# Patient Record
Sex: Male | Born: 1945
Health system: Southern US, Community
[De-identification: ages and names within clinical notes are randomized; demographics above are authoritative.]

## PROBLEM LIST (undated history)

## (undated) DIAGNOSIS — C679 Malignant neoplasm of bladder, unspecified: Secondary | ICD-10-CM

## (undated) DIAGNOSIS — R319 Hematuria, unspecified: Secondary | ICD-10-CM

## (undated) DIAGNOSIS — I10 Essential (primary) hypertension: Secondary | ICD-10-CM

## (undated) DIAGNOSIS — Z923 Personal history of irradiation: Secondary | ICD-10-CM

## (undated) DIAGNOSIS — C775 Secondary and unspecified malignant neoplasm of intrapelvic lymph nodes: Principal | ICD-10-CM

## (undated) DIAGNOSIS — IMO0001 Reserved for inherently not codable concepts without codable children: Secondary | ICD-10-CM

## (undated) DIAGNOSIS — M25461 Effusion, right knee: Secondary | ICD-10-CM

## (undated) DIAGNOSIS — R350 Frequency of micturition: Secondary | ICD-10-CM

## (undated) HISTORY — DX: Malignant neoplasm of bladder, unspecified: C67.9

## (undated) HISTORY — DX: Secondary and unspecified malignant neoplasm of intrapelvic lymph nodes: C77.5

## (undated) HISTORY — DX: Personal history of irradiation: Z92.3

---

## 2002-01-31 ENCOUNTER — Ambulatory Visit (HOSPITAL_COMMUNITY): Admission: RE | Admit: 2002-01-31 | Discharge: 2002-01-31 | Payer: Self-pay | Admitting: Gastroenterology

## 2003-01-17 ENCOUNTER — Emergency Department (HOSPITAL_COMMUNITY): Admission: EM | Admit: 2003-01-17 | Discharge: 2003-01-17 | Payer: Self-pay | Admitting: Emergency Medicine

## 2010-12-06 ENCOUNTER — Ambulatory Visit: Payer: Self-pay | Admitting: Internal Medicine

## 2012-01-08 ENCOUNTER — Ambulatory Visit: Payer: Self-pay | Admitting: Family Medicine

## 2012-01-08 ENCOUNTER — Other Ambulatory Visit: Payer: Self-pay | Admitting: Family Medicine

## 2012-01-08 VITALS — BP 124/75 | HR 67 | Temp 97.7°F | Resp 16 | Ht 73.5 in | Wt 191.0 lb

## 2012-01-08 DIAGNOSIS — N390 Urinary tract infection, site not specified: Secondary | ICD-10-CM

## 2012-01-08 DIAGNOSIS — R300 Dysuria: Secondary | ICD-10-CM

## 2012-01-08 NOTE — Progress Notes (Signed)
66 yo man with dysuria several days, similar to last summer.  No fever.  Unmarried, no discharge but concerned about STD.  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  He was scoped last summer by urology and cleared with nl prostate. O:  NAD, alert coorperative Genitalia:  Normal uncirc male Rectal:  Normal prostate.  A:  UTI likely without complications P:  chk urine cx, uriprobe cipro 500 bid x 7d.

## 2012-01-08 NOTE — Patient Instructions (Signed)

## 2012-01-09 LAB — GC/CHLAMYDIA PROBE AMP, URINE
Chlamydia, Swab/Urine, PCR: NEGATIVE
GC Probe Amp, Urine: NEGATIVE

## 2012-01-10 LAB — URINE CULTURE: Colony Count: 5000

## 2012-01-15 ENCOUNTER — Other Ambulatory Visit: Payer: Self-pay

## 2012-01-15 NOTE — Telephone Encounter (Signed)
Pt would like to see if Dr would call in another rx he still is having some burning while he urinates and he is not understanding why ,he has taken all of his medication -[facility] [address]

## 2012-01-16 NOTE — Telephone Encounter (Signed)
Please advise on this. Thanks 

## 2012-01-17 NOTE — Telephone Encounter (Signed)
Please advise the patient to return for re-evaluation if his symptoms persist.

## 2012-01-17 NOTE — Telephone Encounter (Signed)
LMOM to RTC. 

## 2012-01-19 ENCOUNTER — Ambulatory Visit (INDEPENDENT_AMBULATORY_CARE_PROVIDER_SITE_OTHER): Payer: Self-pay | Admitting: Family Medicine

## 2012-01-19 VITALS — BP 155/80 | HR 67 | Temp 97.7°F | Resp 16 | Ht 72.0 in | Wt 198.0 lb

## 2012-01-19 DIAGNOSIS — R300 Dysuria: Secondary | ICD-10-CM

## 2012-01-19 MED ORDER — DOXYCYCLINE HYCLATE 100 MG PO TABS: 100.0000 mg | ORAL_TABLET | Freq: Two times a day (BID) | ORAL | Status: AC

## 2012-01-19 NOTE — Progress Notes (Signed)
S:  Still having mild dysuria.   Results for orders placed in visit on 01/08/12  URINE CULTURE      Component Value Range   Colony Count 5,000 COLONIES/ML     Organism ID, Bacteria Insignificant Growth    GC/CHLAMYDIA PROBE AMP, URINE      Component Value Range   Chlamydia, Swab/Urine, PCR NEGATIVE  NEGATIVE    GC Probe Amp, Urine NEGATIVE  NEGATIVE    P:  Doxycycline 100 mg bid x 7 days

## 2012-02-07 ENCOUNTER — Telehealth: Payer: Self-pay

## 2012-02-07 NOTE — Telephone Encounter (Signed)
Dr. Milus Glazier  Pt states he was treated for UTI - the medication you gave him worked.  However, sometimes he has a burning sensation when urinating.  He would like additional medication to completely clear up the symptons.  [facility] [address]

## 2012-02-08 ENCOUNTER — Other Ambulatory Visit: Payer: Self-pay | Admitting: Family Medicine

## 2012-02-08 MED ORDER — DOXYCYCLINE HYCLATE 100 MG PO TABS: 100.0000 mg | ORAL_TABLET | Freq: Two times a day (BID) | ORAL | Status: AC

## 2012-02-08 NOTE — Telephone Encounter (Signed)
Doxycycline called in

## 2012-02-09 NOTE — Telephone Encounter (Signed)
Patient notified and voiced understating. 

## 2012-03-24 ENCOUNTER — Ambulatory Visit: Payer: Self-pay | Admitting: Emergency Medicine

## 2012-03-24 VITALS — BP 156/87 | HR 56 | Temp 97.5°F | Resp 16 | Ht 74.5 in | Wt 195.4 lb

## 2012-03-24 DIAGNOSIS — S335XXA Sprain of ligaments of lumbar spine, initial encounter: Secondary | ICD-10-CM

## 2012-03-24 MED ORDER — NAPROXEN SODIUM 550 MG PO TABS: 550.0000 mg | ORAL_TABLET | Freq: Two times a day (BID) | ORAL | Status: AC

## 2012-03-24 MED ORDER — TRAMADOL HCL 50 MG PO TABS: 50.0000 mg | ORAL_TABLET | Freq: Four times a day (QID) | ORAL | Status: AC | PRN

## 2012-03-24 MED ORDER — CYCLOBENZAPRINE HCL 10 MG PO TABS: 10.0000 mg | ORAL_TABLET | Freq: Three times a day (TID) | ORAL | Status: AC | PRN

## 2012-03-24 NOTE — Patient Instructions (Addendum)
Back Pain, Adult Low back pain is very common. About 1 in 5 people have back pain.The cause of low back pain is rarely dangerous. The pain often gets better over time.About half of people with a sudden onset of back pain feel better in just 2 weeks. About 8 in 10 people feel better by 6 weeks.  CAUSES Some common causes of back pain include:  Strain of the muscles or ligaments supporting the spine.   Wear and tear (degeneration) of the spinal discs.   Arthritis.   Direct injury to the back.  DIAGNOSIS Most of the time, the direct cause of low back pain is not known.However, back pain can be treated effectively even when the exact cause of the pain is unknown.Answering your caregiver's questions about your overall health and symptoms is one of the most accurate ways to make sure the cause of your pain is not dangerous. If your caregiver needs more information, he or she may order lab work or imaging tests (X-rays or MRIs).However, even if imaging tests show changes in your back, this usually does not require surgery. HOME CARE INSTRUCTIONS For many people, back pain returns.Since low back pain is rarely dangerous, it is often a condition that people can learn to manageon their own.   Remain active. It is stressful on the back to sit or stand in one place. Do not sit, drive, or stand in one place for more than 30 minutes at a time. Take short walks on level surfaces as soon as pain allows.Try to increase the length of time you walk each day.   Do not stay in bed.Resting more than 1 or 2 days can delay your recovery.   Do not avoid exercise or work.Your body is made to move.It is not dangerous to be active, even though your back may hurt.Your back will likely heal faster if you return to being active before your pain is gone.   Pay attention to your body when you bend and lift. Many people have less discomfortwhen lifting if they bend their knees, keep the load close to their  bodies,and avoid twisting. Often, the most comfortable positions are those that put less stress on your recovering back.   Find a comfortable position to sleep. Use a firm mattress and lie on your side with your knees slightly bent. If you lie on your back, put a pillow under your knees.   Only take over-the-counter or prescription medicines as directed by your caregiver. Over-the-counter medicines to reduce pain and inflammation are often the most helpful.Your caregiver may prescribe muscle relaxant drugs.These medicines help dull your pain so you can more quickly return to your normal activities and healthy exercise.   Put ice on the injured area.   Put ice in a plastic bag.   Place a towel between your skin and the bag.   Leave the ice on for 15 to 20 minutes, 3 to 4 times a day for the first 2 to 3 days. After that, ice and heat may be alternated to reduce pain and spasms.   Ask your caregiver about trying back exercises and gentle massage. This may be of some benefit.   Avoid feeling anxious or stressed.Stress increases muscle tension and can worsen back pain.It is important to recognize when you are anxious or stressed and learn ways to manage it.Exercise is a great option.  SEEK MEDICAL CARE IF:  You have pain that is not relieved with rest or medicine.   You have   pain that does not improve in 1 week.   You have new symptoms.   You are generally not feeling well.  SEEK IMMEDIATE MEDICAL CARE IF:   You have pain that radiates from your back into your legs.   You develop new bowel or bladder control problems.   You have unusual weakness or numbness in your arms or legs.   You develop nausea or vomiting.   You develop abdominal pain.   You feel faint.  Document Released: 09/15/2005 Document Revised: 09/04/2011 Document Reviewed: 02/03/2011 Exit[employer] Patient Information 2012 [employer]. 
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
## 2013-04-20 ENCOUNTER — Ambulatory Visit (INDEPENDENT_AMBULATORY_CARE_PROVIDER_SITE_OTHER): Payer: Medicare Other | Admitting: Family Medicine

## 2013-04-20 VITALS — BP 135/85 | HR 86 | Temp 98.0°F | Resp 16 | Ht 75.0 in | Wt 191.0 lb

## 2013-04-20 DIAGNOSIS — R319 Hematuria, unspecified: Secondary | ICD-10-CM

## 2013-04-20 DIAGNOSIS — I10 Essential (primary) hypertension: Secondary | ICD-10-CM

## 2013-04-20 DIAGNOSIS — Z0000 Encounter for general adult medical examination without abnormal findings: Secondary | ICD-10-CM

## 2013-04-20 DIAGNOSIS — Z87891 Personal history of nicotine dependence: Secondary | ICD-10-CM

## 2013-04-20 DIAGNOSIS — R350 Frequency of micturition: Secondary | ICD-10-CM

## 2013-04-20 LAB — POCT URINALYSIS DIPSTICK
Bilirubin, UA: NEGATIVE
Glucose, UA: NEGATIVE
Ketones, UA: NEGATIVE
Leukocytes, UA: NEGATIVE
Nitrite, UA: NEGATIVE
pH, UA: 5.5

## 2013-04-20 LAB — PSA: PSA: 1.3 ng/mL (ref <=4.00)

## 2013-04-20 LAB — POCT UA - MICROSCOPIC ONLY
Casts, Ur, LPF, POC: NEGATIVE
Mucus, UA: POSITIVE

## 2013-04-20 LAB — POCT CBC
Lymph, poc: 2.6 (ref 0.6–3.4)
MCH, POC: 29.2 pg (ref 27–31.2)
MCHC: 31.7 g/dL — AB (ref 31.8–35.4)
MID (cbc): 0.5 (ref 0–0.9)
MPV: 10.6 fL (ref 0–99.8)
POC Granulocyte: 4.1 (ref 2–6.9)
POC LYMPH PERCENT: 35.7 %L (ref 10–50)
POC MID %: 7.1 %M (ref 0–12)
Platelet Count, POC: 193 10*3/uL (ref 142–424)
RDW, POC: 13.8 %
WBC: 7.2 10*3/uL (ref 4.6–10.2)

## 2013-04-20 LAB — LIPID PANEL
Cholesterol: 184 mg/dL (ref 0–200)
LDL Cholesterol: 117 mg/dL — ABNORMAL HIGH (ref 0–99)
Total CHOL/HDL Ratio: 3.3 Ratio
Triglycerides: 53 mg/dL (ref <150)
VLDL: 11 mg/dL (ref 0–40)

## 2013-04-20 LAB — COMPREHENSIVE METABOLIC PANEL
ALT: 31 U/L (ref 0–53)
Alkaline Phosphatase: 56 U/L (ref 39–117)
Creat: 1.4 mg/dL — ABNORMAL HIGH (ref 0.50–1.35)
Sodium: 139 mEq/L (ref 135–145)
Total Bilirubin: 0.7 mg/dL (ref 0.3–1.2)
Total Protein: 7.9 g/dL (ref 6.0–8.3)

## 2013-04-20 MED ORDER — CEPHALEXIN 500 MG PO CAPS: 500.0000 mg | ORAL_CAPSULE | Freq: Two times a day (BID) | ORAL | Status: DC

## 2013-04-20 MED ORDER — HYDROCHLOROTHIAZIDE 12.5 MG PO CAPS: 12.5000 mg | ORAL_CAPSULE | Freq: Every day | ORAL | Status: DC

## 2013-04-20 NOTE — Patient Instructions (Addendum)
I recommend that you have a Tdap vaccine to prevent tetanus, diptheria and pertussis (whooping cough).  Also, it would be wise to have a pneumonia vaccine to help protect you from lung infection and a shingles shot to prevent shingles.    I will be in touch with your labs  Please see your sports med doctor as needed for your knee.   Congratulations on quitting smoking.  Because you did smoke for a long time a screening chest CT is recommended.

## 2013-04-20 NOTE — Progress Notes (Addendum)
[facility] [address] [place] [address] [phone]  Date:  04/20/2013   Name:  Dustin Jordan   DOB:  1946/02/27   MRN:  [id number]  PCP:  No primary provider on file.    Chief Complaint: Annual Exam   History of Present Illness:  Dustin Jordan is a 67 y.o. very pleasant male patient who presents with the following:  Here today for a PE.   He has a histoyr of HTN on HCTZ.  He has noted some pain in his right knee for a couple of years.  However, it is bothing him more as of late so he might like to see a sports medicine doctor.  He plans to contact a orthopedist recommended to him by family.   He is fasting today for labs History of hematuria- reports he has been evaluated by urology in the past and told that all was ok.  Per urology notes in 04/2011 he had a suspicious lesion in his bladder that would need biopsy unless his culture was positive.  I do not see culture results.  Dustin Jordan is really not sure but thinks that maybe he was supposed to have a biopsy.    He has had a colonoscopy- he thinks in the last 5 years.    He smoked about 1 ppd from the age of 57 or 33, quit in 2001 started back and quit again in 2012  Discussed vaccines which are indicated for him including Tdap, penumovax and zostavax.  "I don't do shots."  He declines immuniztions today but says he will think about it.    He also states he has noted frequent urination for the last week or so.  He does not have any pain.  He has not noted any hematuria.   He is most bothered by frequent urination at night.  Last night he got up 3x- this is a lot for him.   There are no active problems to display for this patient.   History reviewed. No pertinent past medical history.  History reviewed. No pertinent past surgical history.  History  Substance Use Topics  . Smoking status: Former Games developer  . Smokeless tobacco: Not on file  . Alcohol Use: Not on file    History reviewed. No  pertinent family history.  No Known Allergies  Medication list has been reviewed and updated.  No current outpatient prescriptions on file prior to visit.   No current facility-administered medications on file prior to visit.    Review of Systems:  As per HPI- otherwise negative.  Physical Examination: Filed Vitals:   04/20/13 0833  BP: 142/90  Pulse: 86  Temp: 98 F (36.7 C)  Resp: 16   Filed Vitals:   04/20/13 0833  Height: 6\' 3"  (1.905 m)  Weight: 191 lb (86.637 kg)   Body mass index is 23.87 kg/(m^2). Ideal Body Weight: Weight in (lb) to have BMI = 25: 199.6  GEN: WDWN, NAD, Non-toxic, A & O x 3 HEENT: Atraumatic, Normocephalic. Neck supple. No masses, No LAD.  Bilateral TM wnl, oropharynx normal.  PEERL,EOMI.   Ears and Nose: No external deformity. CV: RRR, No M/G/R. No JVD. No thrill. No extra heart sounds. PULM: CTA B, no wheezes, crackles, rhonchi. No retractions. No resp. distress. No accessory muscle use. ABD: S, NT, ND, +BS. No rebound. No HSM. EXTR: No c/c/e NEURO Normal gait.  PSYCH: Normally interactive. Conversant. Not depressed or anxious appearing.  Calm demeanor.  GU: no scrotal masses or tenderness.  Prostate normal Right knee: crepitus consistent with OA.  Normal ROM, no effusion. No acute pain  Results for orders placed in visit on 04/20/13  POCT CBC      Result Value Range   WBC 7.2  4.6 - 10.2 K/uL   Lymph, poc 2.6  0.6 - 3.4   POC LYMPH PERCENT 35.7  10 - 50 %L   MID (cbc) 0.5  0 - 0.9   POC MID % 7.1  0 - 12 %M   POC Granulocyte 4.1  2 - 6.9   Granulocyte percent 51.2  37 - 80 %G   RBC 4.72  4.69 - 6.13 M/uL   Hemoglobin 13.8 (*) 14.1 - 18.1 g/dL   HCT, POC 16.1  09.6 - 53.7 %   MCV 92.2  80 - 97 fL   MCH, POC 29.2  27 - 31.2 pg   MCHC 31.7 (*) 31.8 - 35.4 g/dL   RDW, POC 04.5     Platelet Count, POC 193  142 - 424 K/uL   MPV 10.6  0 - 99.8 fL  POCT URINALYSIS DIPSTICK      Result Value Range   Color, UA brown     Clarity, UA  cloudy     Glucose, UA neg     Bilirubin, UA neg     Ketones, UA neg     Spec Grav, UA 1.025     Blood, UA large     pH, UA 5.5     Protein, UA 100     Urobilinogen, UA 0.2     Nitrite, UA neg     Leukocytes, UA Negative    POCT UA - MICROSCOPIC ONLY      Result Value Range   WBC, Ur, HPF, POC 2-5     RBC, urine, microscopic TNTC     Bacteria, U Microscopic 1+     Mucus, UA pos     Epithelial cells, urine per micros 2-3     Crystals, Ur, HPF, POC neg     Casts, Ur, LPF, POC neg     Yeast, UA neg     Amorphous, UA pos      Assessment and Plan: Physical exam - Plan: POCT CBC, Comprehensive metabolic panel, PSA, POCT urinalysis dipstick, POCT UA - Microscopic Only, Lipid panel  HTN (hypertension) - Plan: hydrochlorothiazide (MICROZIDE) 12.5 MG capsule  History of smoking - Plan: CT CHEST WO/CM SCREENING  Hematuria - Plan: Urine culture, cephALEXin (KEFLEX) 500 MG capsule  Urinary frequency - Plan: Urine culture  Dustin Jordan is here for a PE exam today.  His BP is ok- continue HCTZ and await his labs. History of smoking that fits the [place]PSTF requirements for screening chest CT.  He is interested in this test, will arrange. I am concerned that he may not have returned for follow-up as needed for hematuria.  Will start keflex for possible UTI and await culture.  Plan to contact urology regarding his follow- up and send back as needed.  His smoking history places him at a higher risk for bladder cancer. Advised regarding recommended immunizations. He will think about these but declines today.      Signed Abbe Amsterdam, MD  [facility], Dr. Sherron Monday  7/24- called to give him an update. Called [facility] and it looks like his ucx was positive in 2012.  He says he feels better with keflex.  Await urine culture to decide about hematuria work-up  7/27- received negative urine culture.  Referral to urology placed, records faxed, pt alerted that he needs urology evaluation. Called him and  let him know- he is to call us if he has not heard about his urology appt in the next week or so.  Advised that he may have bladder cancer and that urology evaluation is essential.   Advised he can stop taking the keflex after one week- he was given 40 pills in error.

## 2013-04-21 ENCOUNTER — Telehealth: Payer: Self-pay | Admitting: Family Medicine

## 2013-04-21 NOTE — Telephone Encounter (Signed)
Called to speak to Dr. Mina Marble nurse.  Diaz did have a positive urine culture in 2012, so the bladder bx was not needed.  Await his repeat urine culture from yesterday to see if he needs to see urology again

## 2013-04-24 ENCOUNTER — Encounter: Payer: Self-pay | Admitting: Family Medicine

## 2013-04-24 NOTE — Addendum Note (Signed)
Addended by: Abbe Amsterdam C on: 04/24/2013 06:40 AM   Modules accepted: Orders

## 2013-04-24 NOTE — Addendum Note (Signed)
Addended by: Pearline Cables on: 04/24/2013 06:35 PM   Modules accepted: Orders

## 2013-05-03 ENCOUNTER — Telehealth: Payer: Self-pay | Admitting: Radiology

## 2013-05-03 NOTE — Telephone Encounter (Signed)
Called [facility] regarding CT chest. Advised [place] preventative task force recommends Screening CT scan of the chest for patient since he is a smoker. Spoke to [employer] case reviewer. She will send case for further review with [employer] the case number [id number]. Unfortunately Marchelle Folks could not check this in her options to give approval. She is sending to the physician to review. Dustin Jordan

## 2013-05-04 ENCOUNTER — Other Ambulatory Visit: Payer: Self-pay

## 2013-05-24 ENCOUNTER — Other Ambulatory Visit: Payer: Self-pay | Admitting: Urology

## 2013-06-08 HISTORY — PX: KNEE ARTHROSCOPY: SUR90

## 2013-06-15 ENCOUNTER — Encounter (HOSPITAL_BASED_OUTPATIENT_CLINIC_OR_DEPARTMENT_OTHER): Payer: Self-pay | Admitting: *Deleted

## 2013-06-15 NOTE — Progress Notes (Signed)
06/15/13 1023  OBSTRUCTIVE SLEEP APNEA  Have you ever been diagnosed with sleep apnea through a sleep study? No  Do you snore loudly (loud enough to be heard through closed doors)?  1  Do you often feel tired, fatigued, or sleepy during the daytime? 0  Has anyone observed you stop breathing during your sleep? 0  Do you have, or are you being treated for high blood pressure? 1  BMI more than 35 kg/m2? 0  Age over 67 years old? 1  Neck circumference greater than 40 cm/18 inches? 0  Gender: 1  Obstructive Sleep Apnea Score 4  Score 4 or greater  Results sent to PCP

## 2013-06-15 NOTE — Progress Notes (Addendum)
NPO AFTER MN WITH EXCEPTION CLEAR LIQUIDS UNTIL 0900 (NO CREAM/ MILK PRODUCTS).  ARRIVE AT 1330. NEEDS ISTAT 8 AND EKG.  

## 2013-06-15 NOTE — Progress Notes (Signed)
06/15/13 1050  OBSTRUCTIVE SLEEP APNEA  Have you ever been diagnosed with sleep apnea through a sleep study? No  If yes, do you have and use a CPAP or BPAP machine every night? 0  Do you snore loudly (loud enough to be heard through closed doors)?  1  Do you often feel tired, fatigued, or sleepy during the daytime? 0  Has anyone observed you stop breathing during your sleep? 0  Do you have, or are you being treated for high blood pressure? 1  BMI more than 35 kg/m2? 0  Age over 4 years old? 1  Neck circumference greater than 40 cm/18 inches? 0  Gender: 1  Obstructive Sleep Apnea Score 4  Score 4 or greater  Results sent to PCP

## 2013-06-20 ENCOUNTER — Telehealth: Payer: Self-pay | Admitting: Family Medicine

## 2013-06-20 NOTE — Telephone Encounter (Signed)
Called to do peer- to peer for his screening CT chest.  Medicare does not cover this service.  He can either pay for it or we can do a CXR and see if we find any abnormality that might necessitate CT follow-up. Will share this information with him by letter

## 2013-06-22 ENCOUNTER — Inpatient Hospital Stay (HOSPITAL_COMMUNITY): Payer: Medicare Other

## 2013-06-22 ENCOUNTER — Encounter (HOSPITAL_BASED_OUTPATIENT_CLINIC_OR_DEPARTMENT_OTHER): Payer: Self-pay | Admitting: Anesthesiology

## 2013-06-22 ENCOUNTER — Ambulatory Visit (HOSPITAL_COMMUNITY): Payer: Medicare Other

## 2013-06-22 ENCOUNTER — Ambulatory Visit (HOSPITAL_BASED_OUTPATIENT_CLINIC_OR_DEPARTMENT_OTHER): Payer: Medicare Other | Admitting: Anesthesiology

## 2013-06-22 ENCOUNTER — Other Ambulatory Visit: Payer: Self-pay

## 2013-06-22 ENCOUNTER — Observation Stay (HOSPITAL_BASED_OUTPATIENT_CLINIC_OR_DEPARTMENT_OTHER)
Admission: RE | Admit: 2013-06-22 | Discharge: 2013-06-23 | Disposition: A | Discharge status: Home / Self Care | Payer: Medicare Other | Source: Ambulatory Visit | Attending: Urology | Admitting: Urology

## 2013-06-22 ENCOUNTER — Encounter (HOSPITAL_COMMUNITY)
Admission: RE | Disposition: A | Discharge status: Home / Self Care | Payer: Medicare Other | Source: Ambulatory Visit | Attending: Urology

## 2013-06-22 ENCOUNTER — Encounter (HOSPITAL_BASED_OUTPATIENT_CLINIC_OR_DEPARTMENT_OTHER): Payer: Self-pay | Admitting: *Deleted

## 2013-06-22 DIAGNOSIS — C671 Malignant neoplasm of dome of bladder: Principal | ICD-10-CM | POA: Insufficient documentation

## 2013-06-22 DIAGNOSIS — I10 Essential (primary) hypertension: Secondary | ICD-10-CM | POA: Insufficient documentation

## 2013-06-22 DIAGNOSIS — Z87891 Personal history of nicotine dependence: Secondary | ICD-10-CM | POA: Insufficient documentation

## 2013-06-22 HISTORY — DX: Reserved for inherently not codable concepts without codable children: IMO0001

## 2013-06-22 HISTORY — DX: Malignant neoplasm of bladder, unspecified: C67.9

## 2013-06-22 HISTORY — DX: Effusion, right knee: M25.461

## 2013-06-22 HISTORY — DX: Hematuria, unspecified: R31.9

## 2013-06-22 HISTORY — PX: CYSTOSCOPY W/ RETROGRADES: SHX1426

## 2013-06-22 HISTORY — DX: Essential (primary) hypertension: I10

## 2013-06-22 HISTORY — PX: TRANSURETHRAL RESECTION OF BLADDER TUMOR WITH GYRUS (TURBT-GYRUS): SHX6458

## 2013-06-22 HISTORY — DX: Frequency of micturition: R35.0

## 2013-06-22 LAB — BASIC METABOLIC PANEL
CO2: 23 mEq/L (ref 19–32)
Calcium: 9.5 mg/dL (ref 8.4–10.5)
Chloride: 103 mEq/L (ref 96–112)
Creatinine, Ser: 1.08 mg/dL (ref 0.50–1.35)
GFR calc Af Amer: 81 mL/min — ABNORMAL LOW (ref >90)
Glucose, Bld: 91 mg/dL (ref 70–99)

## 2013-06-22 LAB — POCT I-STAT, CHEM 8
Creatinine, Ser: 1.3 mg/dL (ref 0.50–1.35)
Glucose, Bld: 82 mg/dL (ref 70–99)
HCT: 45 % (ref 39.0–52.0)
Hemoglobin: 15.3 g/dL (ref 13.0–17.0)
TCO2: 28 mmol/L (ref 0–100)

## 2013-06-22 SURGERY — CYSTOSCOPY, WITH RETROGRADE PYELOGRAM
Anesthesia: General | Site: Ureter | Wound class: Clean Contaminated

## 2013-06-22 MED ORDER — ACETAMINOPHEN 500 MG PO TABS
1000.0000 mg | ORAL_TABLET | Freq: Four times a day (QID) | ORAL | Status: DC
  Administered 2013-06-23 (×3): 1000 mg via ORAL
  Filled 2013-06-22 (×4): qty 2

## 2013-06-22 MED ORDER — GLYCOPYRROLATE 0.2 MG/ML IJ SOLN
INTRAMUSCULAR | Status: DC | PRN
  Administered 2013-06-22: .9 mg via INTRAVENOUS

## 2013-06-22 MED ORDER — SODIUM CHLORIDE 0.9 % IR SOLN
Status: DC | PRN
  Administered 2013-06-22: 1800 mL

## 2013-06-22 MED ORDER — LIDOCAINE HCL (CARDIAC) 20 MG/ML IV SOLN
INTRAVENOUS | Status: DC | PRN
  Administered 2013-06-22: 100 mg via INTRAVENOUS

## 2013-06-22 MED ORDER — FENTANYL CITRATE 0.05 MG/ML IJ SOLN
INTRAMUSCULAR | Status: DC | PRN
  Administered 2013-06-22 (×2): 100 ug via INTRAVENOUS

## 2013-06-22 MED ORDER — ROCURONIUM BROMIDE 100 MG/10ML IV SOLN
INTRAVENOUS | Status: DC | PRN
  Administered 2013-06-22: 25 mg via INTRAVENOUS
  Administered 2013-06-22: 10 mg via INTRAVENOUS

## 2013-06-22 MED ORDER — CIPROFLOXACIN HCL 500 MG PO TABS
500.0000 mg | ORAL_TABLET | Freq: Two times a day (BID) | ORAL | Status: DC
  Filled 2013-06-22: qty 1

## 2013-06-22 MED ORDER — OXYCODONE HCL 5 MG PO TABS
5.0000 mg | ORAL_TABLET | ORAL | Status: DC | PRN
  Administered 2013-06-23: 5 mg via ORAL
  Filled 2013-06-22 (×2): qty 1

## 2013-06-22 MED ORDER — DEXAMETHASONE SODIUM PHOSPHATE 4 MG/ML IJ SOLN
INTRAMUSCULAR | Status: DC | PRN
  Administered 2013-06-22: 10 mg via INTRAVENOUS

## 2013-06-22 MED ORDER — CIPROFLOXACIN HCL 500 MG PO TABS
500.0000 mg | ORAL_TABLET | Freq: Two times a day (BID) | ORAL | Status: AC
  Administered 2013-06-22: 21:00:00 500 mg via ORAL
  Filled 2013-06-22: qty 1

## 2013-06-22 MED ORDER — GENTAMICIN SULFATE 40 MG/ML IJ SOLN
5.0000 mg/kg | INTRAVENOUS | Status: AC
  Administered 2013-06-22: 430 mg via INTRAVENOUS
  Filled 2013-06-22: qty 10.75

## 2013-06-22 MED ORDER — KCL IN DEXTROSE-NACL 20-5-0.45 MEQ/L-%-% IV SOLN
INTRAVENOUS | Status: DC
  Administered 2013-06-22: 22:00:00 via INTRAVENOUS
  Filled 2013-06-22 (×2): qty 1000

## 2013-06-22 MED ORDER — DOCUSATE SODIUM 100 MG PO CAPS
100.0000 mg | ORAL_CAPSULE | Freq: Two times a day (BID) | ORAL | Status: DC
  Administered 2013-06-22 – 2013-06-23 (×2): 100 mg via ORAL
  Filled 2013-06-22 (×4): qty 1

## 2013-06-22 MED ORDER — SENNA 8.6 MG PO TABS
1.0000 | ORAL_TABLET | Freq: Two times a day (BID) | ORAL | Status: DC
  Administered 2013-06-22 – 2013-06-23 (×2): 8.6 mg via ORAL
  Filled 2013-06-22 (×3): qty 1

## 2013-06-22 MED ORDER — IOHEXOL 350 MG/ML SOLN
INTRAVENOUS | Status: DC | PRN
  Administered 2013-06-22: 17 mL

## 2013-06-22 MED ORDER — LACTATED RINGERS IV SOLN
INTRAVENOUS | Status: DC
  Administered 2013-06-22 (×2): via INTRAVENOUS
  Filled 2013-06-22: qty 1000

## 2013-06-22 MED ORDER — HYDROCHLOROTHIAZIDE 12.5 MG PO CAPS
12.5000 mg | ORAL_CAPSULE | Freq: Every day | ORAL | Status: DC
  Administered 2013-06-23: 12.5 mg via ORAL
  Filled 2013-06-22 (×2): qty 1

## 2013-06-22 MED ORDER — OXYBUTYNIN CHLORIDE 5 MG PO TABS
5.0000 mg | ORAL_TABLET | Freq: Three times a day (TID) | ORAL | Status: DC | PRN
  Filled 2013-06-22 (×2): qty 1

## 2013-06-22 MED ORDER — IOHEXOL 300 MG/ML  SOLN
100.0000 mL | Freq: Once | INTRAMUSCULAR | Status: AC | PRN
  Administered 2013-06-22: 100 mL via INTRAVENOUS

## 2013-06-22 MED ORDER — NEOSTIGMINE METHYLSULFATE 1 MG/ML IJ SOLN
INTRAMUSCULAR | Status: DC | PRN
  Administered 2013-06-22: 5 mg via INTRAVENOUS

## 2013-06-22 MED ORDER — HYDROMORPHONE HCL PF 1 MG/ML IJ SOLN
0.5000 mg | INTRAMUSCULAR | Status: DC | PRN
  Administered 2013-06-22 (×2): 1 mg via INTRAVENOUS
  Administered 2013-06-22 (×2): 0.25 mg via INTRAVENOUS
  Administered 2013-06-23: 1 mg via INTRAVENOUS
  Filled 2013-06-22 (×4): qty 1

## 2013-06-22 MED ORDER — PROPOFOL 10 MG/ML IV BOLUS
INTRAVENOUS | Status: DC | PRN
  Administered 2013-06-22: 200 mg via INTRAVENOUS

## 2013-06-22 MED ORDER — ONDANSETRON HCL 4 MG/2ML IJ SOLN
4.0000 mg | INTRAMUSCULAR | Status: DC | PRN
  Filled 2013-06-22: qty 2

## 2013-06-22 SURGICAL SUPPLY — 35 items
BAG DRN ANRFLXCHMBR STRAP LEK (BAG)
BAG URINE DRAINAGE (UROLOGICAL SUPPLIES) ×1 IMPLANT
BAG URINE LEG 19OZ MD ST LTX (BAG) IMPLANT
BAG URINE LEG 500ML (DRAIN) IMPLANT
BAG URO CATCHER STRL LF (DRAPE) ×3 IMPLANT
BASKET ZERO TIP NITINOL 2.4FR (BASKET) IMPLANT
BSKT STON RTRVL ZERO TP 2.4FR (BASKET)
CANISTER SUCT LVC 12 LTR MEDI- (MISCELLANEOUS) ×2 IMPLANT
CATH FOLEY 2WAY SLVR  5CC 22FR (CATHETERS)
CATH FOLEY 2WAY SLVR 30CC 20FR (CATHETERS) IMPLANT
CATH FOLEY 2WAY SLVR 5CC 22FR (CATHETERS) IMPLANT
CATH FOLEY 3WAY 30CC 22F (CATHETERS) ×1 IMPLANT
CATH INTERMIT  6FR 70CM (CATHETERS) IMPLANT
CLOTH BEACON ORANGE TIMEOUT ST (SAFETY) ×3 IMPLANT
DRAPE CAMERA CLOSED 9X96 (DRAPES) ×3 IMPLANT
ELECT LOOP MED HF 24F 12D CBL (CLIP) IMPLANT
ELECT REM PT RETURN 9FT ADLT (ELECTROSURGICAL) ×3
ELECT RESECT VAPORIZE 12D CBL (ELECTRODE) IMPLANT
ELECTRODE REM PT RTRN 9FT ADLT (ELECTROSURGICAL) ×2 IMPLANT
EVACUATOR MICROVAS BLADDER (UROLOGICAL SUPPLIES) IMPLANT
GLOVE BIO SURGEON STRL SZ 6 (GLOVE) ×2 IMPLANT
GLOVE BIO SURGEON STRL SZ 6.5 (GLOVE) ×1 IMPLANT
GLOVE BIO SURGEON STRL SZ7.5 (GLOVE) ×3 IMPLANT
GLOVE BIOGEL PI IND STRL 6.5 (GLOVE) IMPLANT
GLOVE BIOGEL PI INDICATOR 6.5 (GLOVE) ×1
GOWN PREVENTION PLUS XLARGE (GOWN DISPOSABLE) ×3 IMPLANT
GOWN STRL NON-REIN LRG LVL3 (GOWN DISPOSABLE) ×6 IMPLANT
GUIDEWIRE ANG ZIPWIRE 038X150 (WIRE) IMPLANT
GUIDEWIRE STR DUAL SENSOR (WIRE) ×3 IMPLANT
IV NS IRRIG 3000ML ARTHROMATIC (IV SOLUTION) ×8 IMPLANT
KIT ASPIRATION TUBING (SET/KITS/TRAYS/PACK) IMPLANT
PACK CYSTOSCOPY (CUSTOM PROCEDURE TRAY) ×3 IMPLANT
SET ASPIRATION TUBING (TUBING) IMPLANT
SYR 30ML LL (SYRINGE) ×1 IMPLANT
SYRINGE IRR TOOMEY STRL 70CC (SYRINGE) IMPLANT

## 2013-06-22 NOTE — Anesthesia Preprocedure Evaluation (Addendum)
Anesthesia Evaluation  Patient identified by MRN, date of birth, ID band Patient awake    Reviewed: Allergy & Precautions, H&P , NPO status , Patient's Chart, lab work & pertinent test results  Airway Mallampati: I TM Distance: >3 FB Neck ROM: Full    Dental  (+) Dental Advisory Given and Edentulous Upper   Pulmonary neg pulmonary ROS,  breath sounds clear to auscultation        Cardiovascular hypertension, Pt. on medications Rhythm:Regular Rate:Normal     Neuro/Psych negative neurological ROS  negative psych ROS   GI/Hepatic negative GI ROS, Neg liver ROS,   Endo/Other  negative endocrine ROS  Renal/GU negative Renal ROS     Musculoskeletal negative musculoskeletal ROS (+)   Abdominal   Peds  Hematology negative hematology ROS (+)   Anesthesia Other Findings   Reproductive/Obstetrics                          Anesthesia Physical Anesthesia Plan  ASA: II  Anesthesia Plan: General   Post-op Pain Management:    Induction: Intravenous  Airway Management Planned: LMA  Additional Equipment:   Intra-op Plan:   Post-operative Plan: Extubation in OR  Informed Consent: I have reviewed the patients History and Physical, chart, labs and discussed the procedure including the risks, benefits and alternatives for the proposed anesthesia with the patient or authorized representative who has indicated his/her understanding and acceptance.   Dental advisory given  Plan Discussed with: CRNA  Anesthesia Plan Comments:         Anesthesia Quick Evaluation

## 2013-06-22 NOTE — H&P (Signed)
Dustin Jordan is an 67 y.o. male.    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 Sherron Monday on work-up of gross hematuria. CT Urogram w/o distant or locally advanced disease. Prior smoker, now quit.  2- Prostate Screening -  04/2013 - PSA 1.0 / DRE wnl per report  PMH sig for HTN. No CV disease. No prior surgeries. No blood thinners.  Today Dustin Jordan is seen to proceed with transurethral resection of his bladder tumor for diagnostic / staging / and theraputic intent. NO interval fevers. Most recent UCX negative.   Past Medical History  Diagnosis Date  . Hypertension   . White coat hypertension   . Swelling of right knee joint     s/p arthroscopy 06-08-2013  . Bladder cancer   . Hematuria   . Frequency of urination     Past Surgical History  Procedure Laterality Date  . Knee arthroscopy Right 06-08-2013    No family history on file. Social History:  reports that he quit smoking about 3 years ago. His smoking use included Cigarettes. He has a 20 pack-year smoking history. He has never used smokeless tobacco. He reports that he does not drink alcohol or use illicit drugs.  Allergies: No Known Allergies  No prescriptions prior to admission    No results found for this or any previous visit (from the past 48 hour(s)). No results found.  Review of Systems  Constitutional: Negative.  Negative for fever and chills.  HENT: Negative.   Eyes: Negative.   Respiratory: Negative.   Cardiovascular: Negative.   Gastrointestinal: Negative.  Negative for nausea and vomiting.  Genitourinary: Negative.   Musculoskeletal: Negative.   Skin: Negative.   Neurological: Negative.   Endo/Heme/Allergies: Negative.   Psychiatric/Behavioral: Negative.     Height 6\' 3"  (1.905 m), weight 86.183 kg (190 lb). Physical Exam  Constitutional: He appears well-developed and  well-nourished.  HENT:  Head: Normocephalic and atraumatic.  Eyes: Pupils are equal, round, and reactive to light.  Neck: Normal range of motion. Neck supple.  Cardiovascular: Normal rate.   Respiratory: Effort normal and breath sounds normal.  GI: Soft. Bowel sounds are normal.  Genitourinary: Penis normal.  Musculoskeletal: Normal range of motion.  Neurological: He is alert.  Skin: Skin is warm and dry.  Psychiatric: He has a normal mood and affect. His behavior is normal. Judgment and thought content normal.     Assessment/Plan  1 - Bladder Cancer -  We rediscussed operative biopsy / transurethral resection as the best next step for diagnostic and therapeutic purposes with goals being to remove all visible cancer and obtain tissue for pathologic exam. We rediscussed that for some low-grade tumors, this may be all the treatment required, but that for many other tumors such as high-grade lesions, further therapy including surgery and or chemotherapy may be warranted. We also reoutlined the fact that any bladder cancer diagnosis will require close follow-up with periodic upper and lower tract evaluation. We rediscussed risks including bleeding, infection, damage to kidney / ureter / bladder including bladder perforation which can typically managed with prolonged foley catheterization. We rementioned anesthetic and other rare risks including DVT, PE, MI, and mortality. I also reentioned that adjunctive procedures such as ureteral stenting, retrograde pyelography, and ureteroscopy may be necessary to fully evaluate the urinary tract depending on intra-operative findings. After answering all questions to the patient's satisfaction,  they wish to proceed today as planned.Marland Kitchen   2- Prostate Screening - up to date this year.   Dustin Jordan 06/22/2013, 7:40 AM

## 2013-06-22 NOTE — Anesthesia Procedure Notes (Signed)
Procedure Name: Intubation Date/Time: 06/22/2013 4:34 PM Performed by: Maris Berger T Pre-anesthesia Checklist: Patient identified, Emergency Drugs available, Suction available and Patient being monitored Patient Re-evaluated:Patient Re-evaluated prior to inductionOxygen Delivery Method: Circle System Utilized Preoxygenation: Pre-oxygenation with 100% oxygen Intubation Type: IV induction Ventilation: Mask ventilation without difficulty Tube type: Oral Number of attempts: 1 Airway Equipment and Method: stylet and oral airway Placement Confirmation: ETT inserted through vocal cords under direct vision,  positive ETCO2 and breath sounds checked- equal and bilateral Secured at: 22 cm Tube secured with: Tape Dental Injury: Teeth and Oropharynx as per pre-operative assessment

## 2013-06-22 NOTE — Brief Op Note (Signed)
06/22/2013  5:44 PM  PATIENT:  Altamese Dilling  67 y.o. male  PRE-OPERATIVE DIAGNOSIS:  BLADDER CANCER  POST-OPERATIVE DIAGNOSIS:  BLADDER CANCER  PROCEDURE:  Procedure(s): CYSTOSCOPY WITH RETROGRADE PYELOGRAM (Bilateral) TRANSURETHRAL RESECTION OF BLADDER TUMOR WITH GYRUS (TURBT-GYRUS) (N/A)  SURGEON:  Surgeon(s) and Role:    * Sebastian Ache, MD - Primary  PHYSICIAN ASSISTANT:   ASSISTANTS: none   ANESTHESIA:   general  EBL:  Total I/O In: 1000 [I.V.:1000] Out: -   BLOOD ADMINISTERED:none  DRAINS: 53F 3 way foley catheter to NS irrigation 1 gtt per second, efflux light pink   LOCAL MEDICATIONS USED:  NONE  SPECIMEN:  Source of Specimen:  Massive BLadder Dome Tumor Fragments  DISPOSITION OF SPECIMEN:  PATHOLOGY  COUNTS:  YES  TOURNIQUET:  * No tourniquets in log *  DICTATION: .Other Dictation: Dictation Number [id number]*  PLAN OF CARE: Admit for overnight observation  PATIENT DISPOSITION:  PACU - hemodynamically stable.   Delay start of Pharmacological VTE agent (>24hrs) due to surgical blood loss or risk of bleeding: yes

## 2013-06-22 NOTE — Transfer of Care (Signed)
Immediate Anesthesia Transfer of Care Note  Patient: Dustin Jordan  Procedure(s) Performed: Procedure(s): CYSTOSCOPY WITH RETROGRADE PYELOGRAM (Bilateral) TRANSURETHRAL RESECTION OF BLADDER TUMOR WITH GYRUS (TURBT-GYRUS) (N/A)  Patient Location: PACU  Anesthesia Type:General  Level of Consciousness: awake, alert  and oriented  Airway & Oxygen Therapy: Patient Spontanous Breathing and Patient connected to nasal cannula oxygen  Post-op Assessment: Report given to PACU RN  Post vital signs: Reviewed and stable  Complications: No apparent anesthesia complications

## 2013-06-23 ENCOUNTER — Encounter (HOSPITAL_BASED_OUTPATIENT_CLINIC_OR_DEPARTMENT_OTHER): Payer: Self-pay | Admitting: Urology

## 2013-06-23 MED ORDER — TRAMADOL HCL 50 MG PO TABS: 50.0000 mg | ORAL_TABLET | Freq: Four times a day (QID) | ORAL | Status: DC | PRN

## 2013-06-23 MED ORDER — BACITRACIN-NEOMYCIN-POLYMYXIN 400-5-5000 EX OINT: 1.0000 "application " | TOPICAL_OINTMENT | Freq: Three times a day (TID) | CUTANEOUS | Status: DC | PRN

## 2013-06-23 MED ORDER — MENTHOL 3 MG MT LOZG
1.0000 | LOZENGE | OROMUCOSAL | Status: DC | PRN
  Administered 2013-06-23: 06:00:00 3 mg via ORAL
  Filled 2013-06-23: qty 9

## 2013-06-23 MED ORDER — SULFAMETHOXAZOLE-TMP DS 800-160 MG PO TABS: 1.0000 | ORAL_TABLET | Freq: Every day | ORAL | Status: DC

## 2013-06-23 MED ORDER — SENNOSIDES-DOCUSATE SODIUM 8.6-50 MG PO TABS: 1.0000 | ORAL_TABLET | Freq: Two times a day (BID) | ORAL | Status: DC

## 2013-06-23 NOTE — Progress Notes (Signed)
Pt education performed on foley catheter care with leg bag and overnight bag. Used teach back and patient accurately performed when asked.

## 2013-06-23 NOTE — Discharge Summary (Signed)
Physician Discharge Summary  Patient ID: Dustin Jordan MRN: [id number] DOB/AGE: [id number] y.o.  Admit date: 06/22/2013 Discharge date: 06/23/2013  Admission Diagnoses: Large Bladder Neoplasm  Discharge Diagnoses: Large Bladder Neoplasm   Discharged Condition: good  Hospital Course:   Pt underwent transurethral resection of large dome neoplasm on the day of admission 06/22/2013 without acute complications. He was admitted overnight for observation. ON day of surgery he underwent staging CT scan which revealed no distant disease but did reveal some gas in perivesical space c/w small extraperitoneal bladder microperforation. No contrast or urinary extravasation seen. Elected to manage conservatively with foley drainage which is in place at discharge with plan for office cystogram next week.   Consults: None  Significant Diagnostic Studies: radiology: CT scan: as per above  Treatments: surgery: as per above  Discharge Exam: Blood pressure 110/72, pulse 80, temperature 98 F (36.7 C), temperature source Oral, resp. rate 15, height 6\' 1"  (1.854 m), weight 85.503 kg (188 lb 8 oz), SpO2 99.00%. General appearance: alert, cooperative and appears stated age Head: Normocephalic, without obvious abnormality, atraumatic Eyes: conjunctivae/corneas clear. PERRL, EOM's intact. Fundi benign. Ears: normal TM's and external ear canals both ears Nose: Nares normal. Septum midline. Mucosa normal. No drainage or sinus tenderness. Throat: lips, mucosa, and tongue normal; teeth and gums normal Neck: no adenopathy, no carotid bruit, no JVD, supple, symmetrical, trachea midline and thyroid not enlarged, symmetric, no tenderness/mass/nodules Back: symmetric, no curvature. ROM normal. No CVA tenderness. Resp: clear to auscultation bilaterally Chest wall: no tenderness Cardio: regular rate and rhythm, S1, S2 normal, no murmur, click, rub or gallop GI: soft, non-tender; bowel sounds normal; no masses,   no organomegaly Male genitalia: normal, Foley c/d/i with dark yellow urine. No clots off irrigation x 8 hours.  Extremities: extremities normal, atraumatic, no cyanosis or edema Pulses: 2+ and symmetric Skin: Skin color, texture, turgor normal. No rashes or lesions Lymph nodes: Cervical, supraclavicular, and axillary nodes normal. Neurologic: Grossly normal  Disposition: Final discharge disposition not confirmed     Medication List         hydrochlorothiazide 12.5 MG capsule  Commonly known as:  MICROZIDE  Take 1 capsule (12.5 mg total) by mouth daily.     senna-docusate 8.6-50 MG per tablet  Commonly known as:  Senokot-S  Take 1 tablet by mouth 2 (two) times daily. While taking pain meds to prevent constipation.     sulfamethoxazole-trimethoprim 800-160 MG per tablet  Commonly known as:  BACTRIM DS  Take 1 tablet by mouth daily. X 7 days to prevent post-op infection     traMADol 50 MG tablet  Commonly known as:  ULTRAM  Take 1-2 tablets (50-100 mg total) by mouth every 6 (six) hours as needed for pain. Post-operatively           Follow-up Information   Follow up with Sebastian Ache, MD. (we will call with appt in few days with XRay and visit)    Specialty:  Urology   Contact information:   [address] [place] [address] [phone]       Signed: Sebastian Ache 06/23/2013, 2:43 PM

## 2013-06-23 NOTE — Progress Notes (Signed)
MD notified the RN of the results from the CT scan. CBI is being titrated. Urine is light pink,no clots seen.  Patient is feeling discomfort at the meatus and feeling some discomfort generally in the bladder/lower abdomen. Pain medication was administered earlier and patient stated that this helped his pain/discomfort.  RN will continue to watch patient closely.

## 2013-06-23 NOTE — Op Note (Signed)
NAMEKHALIFA, KNECHT NO.:  [id number]  MEDICAL RECORD NO.:  [id number]  LOCATION:  1411                         FACILITY:  [facility]  PHYSICIAN:  Sebastian Ache, MD     DATE OF BIRTH:  03-Dec-1945  DATE OF PROCEDURE:  06/22/2013 DATE OF DISCHARGE:                              OPERATIVE REPORT   DIAGNOSIS:  Massive bladder dome cancer.  PROCEDURE: 1. Transurethral resection of bladder tumour, volume large. 2. Bilateral retrograde pyelograms and interpretation.  ESTIMATED BLOOD LOSS:  100 mL.  COMPLICATIONS:  None.  SPECIMENS:  Massive bladder dome tumor fragments.  FINDINGS: 1. Very large nodular-appearing bladder dome tumor with broad base,     approximately 90% of visible tumor resected.  The base was     purposely not resected given its dome location. 2. Unremarkable bilateral retrograde pyelograms.  DRAIN:  A 22-French 3-Foley catheter to normal saline irrigation 1 drop per second, efflux light pink.  INDICATION:  Mr. Bodkin is a very pleasant 67 year old gentleman, who was found on workup of gross hematuria to have a very large necrotic bladder mass anteriorly, this was found by my colleague, Dr. Sherron Monday. He was referred for further management.  I had a detailed discussion with the patient and explained the natural history of bladder cancer with the first diagnostic and therapeutic maneuver being transurethral resection to obtain tissue type to help with staging and also therapeutically.  The patient wished to proceed.  Informed consent was obtained and placed in medical record.  PROCEDURE IN DETAIL:  The patient being Offie Waide verified. Procedure being transurethral resection of bladder tumor was confirmed. Procedure was carried out.  Time-out was performed.  Intravenous antibiotics were administered.  General endotracheal anesthesia was induced.  The patient was placed into a low lithotomy position.  Sterile field was created by prepping  and draping the patient's penis, perineum, and proximal thighs using iodine x3.  Next, cystourethroscopy was performed using a 22-French rigid scope with 12-degree offset lens.  His anterior and posterior urethra unremarkable.  Inspection of the urinary bladder revealed a massive nodular and somewhat necrotic-appearing bladder dome tumor.  There were no other lesions whatsoever in the urinary bladder.  Ureteral orifices were in normal anatomic position. Attention was directed to bilateral retrograde pyelogram, first on the left side the left ureteral orifice was cannulated with 6-French Foley catheter and left retrograde pyelogram obtained.  Left retrograde pyelogram demonstrated a single left ureter, single system left kidney.  No filling defects or narrowing noted.  Attention was then directed at the right side.  The right ureteral orifice was cannulated with a 6-French end-hole catheter and right retrograde pyelogram was obtained.  Right retrograde pyelogram demonstrated single right ureter, single system, right kidney.  No filling defects or narrowing noted.  The cystoscope was then exchanged for a 26-French ACMI continuous flow resectoscope sheath using visual obturator and next using medium resectoscope loop, very careful and systematic resection was performed of the bladder dome mass, taking it down to what appeared to be flush with the dome of the bladder and with some swipes seromuscular tissue was included, but exquisite care was taken to avoid bladder perforation, which did not  occur grossly.  Additional hemostasis was achieved with point cautery and the tumor appeared to be quite large approximately 7 cm or so in diameter.  Following these maneuvers, hemostasis appeared excellent, only very mild venous oozing from some aspects of the base of the tumor.  There was no evidence of bladder perforation.  Given the large size of the tumor and the procedure time in the early  evening, it was felt that observation overnight would be the safest way to proceed with normal saline continuous bladder irrigation.  As such, the resectoscope was exchanged for a 22-French 3-Foley catheter, 15 mL sterile water in the balloon.  This was connected to normal saline irrigation at one drop per second.  The efflux was light pink. Procedure was then terminated.  The patient tolerated the procedure well.  There were no immediate periprocedural complications.  The patient was taken to the postanesthesia care unit in stable condition.          ______________________________ Sebastian Ache, MD     TM/MEDQ  D:  06/22/2013  T:  06/23/2013  Job:  [id number]

## 2013-06-23 NOTE — Plan of Care (Signed)
Problem: Consults Goal: TURP/TURBT Patient Education (See Patient Education module for education specifics.)  Outcome: Completed/Met Date Met:  06/23/13 Handouts were given to the patient of TURBT and foley care.

## 2013-06-23 NOTE — Care Management Note (Signed)
    Page 1 of 1   06/23/2013     11:18:16 AM   CARE MANAGEMENT NOTE 06/23/2013  Patient:  Dustin Jordan, Dustin Jordan   Account Number:  [id number]  Date Initiated:  06/23/2013  Documentation initiated by:  Lanier Clam  Subjective/Objective Assessment:   67 Y/O M ADMITTED Dustin Jordan CA,NEOPLASM.     Action/Plan:   FROM HOME.HAS PCP,PHARMACY.   Anticipated DC Date:  06/23/2013   Anticipated DC Plan:  HOME/SELF CARE      DC Planning Services  CM consult      Choice offered to / List presented to:             Status of service:  In process, will continue to follow Medicare Important Message given?   (If response is "NO", the following Medicare IM given date fields will be blank) Date Medicare IM given:   Date Additional Medicare IM given:    Discharge Disposition:    Per UR Regulation:  Reviewed for med. necessity/level of care/duration of stay  If discussed at Long Length of Stay Meetings, dates discussed:    Comments:  06/23/13 Dustin Euceda RN,BSN NCM [phone] S/P [email or web address] ANTICIPATED D/C NEEDS.

## 2013-06-23 NOTE — Anesthesia Postprocedure Evaluation (Signed)
Anesthesia Post Note  Patient: Dustin Jordan  Procedure(s) Performed: Procedure(s) (LRB): CYSTOSCOPY WITH RETROGRADE PYELOGRAM (Bilateral) TRANSURETHRAL RESECTION OF BLADDER TUMOR WITH GYRUS (TURBT-GYRUS) (N/A)  Anesthesia type: General  Patient location: PACU  Post pain: Pain level controlled  Post assessment: Post-op Vital signs reviewed  Last Vitals:  Filed Vitals:   06/23/13 0451  BP: 104/61  Pulse: 100  Temp: 36.5 C  Resp: 18    Post vital signs: Reviewed  Level of consciousness: sedated  Complications: No apparent anesthesia complications

## 2013-06-23 NOTE — Progress Notes (Signed)
Patient stated that he dosed off and on but never got any real sleep.  Pain was controlled by pain medication and distraction techniques. Will continue to monitor patient.

## 2013-07-26 ENCOUNTER — Encounter: Payer: Self-pay | Admitting: Family Medicine

## 2013-08-04 ENCOUNTER — Other Ambulatory Visit: Payer: Self-pay

## 2014-01-01 ENCOUNTER — Observation Stay (HOSPITAL_COMMUNITY): Payer: Medicare Other

## 2014-01-01 ENCOUNTER — Observation Stay (HOSPITAL_COMMUNITY)
Admission: EM | Admit: 2014-01-01 | Discharge: 2014-01-03 | Disposition: A | Discharge status: Home / Self Care | Payer: Medicare Other | Attending: Urology | Admitting: Urology

## 2014-01-01 ENCOUNTER — Encounter (HOSPITAL_COMMUNITY): Payer: Self-pay | Admitting: Emergency Medicine

## 2014-01-01 DIAGNOSIS — E049 Nontoxic goiter, unspecified: Secondary | ICD-10-CM | POA: Insufficient documentation

## 2014-01-01 DIAGNOSIS — R339 Retention of urine, unspecified: Secondary | ICD-10-CM

## 2014-01-01 DIAGNOSIS — R319 Hematuria, unspecified: Secondary | ICD-10-CM | POA: Insufficient documentation

## 2014-01-01 DIAGNOSIS — R338 Other retention of urine: Secondary | ICD-10-CM | POA: Diagnosis present

## 2014-01-01 DIAGNOSIS — D090 Carcinoma in situ of bladder: Principal | ICD-10-CM | POA: Insufficient documentation

## 2014-01-01 DIAGNOSIS — N3020 Other chronic cystitis without hematuria: Secondary | ICD-10-CM | POA: Insufficient documentation

## 2014-01-01 DIAGNOSIS — I10 Essential (primary) hypertension: Secondary | ICD-10-CM | POA: Insufficient documentation

## 2014-01-01 DIAGNOSIS — J3800 Paralysis of vocal cords and larynx, unspecified: Secondary | ICD-10-CM | POA: Insufficient documentation

## 2014-01-01 DIAGNOSIS — Z87891 Personal history of nicotine dependence: Secondary | ICD-10-CM | POA: Insufficient documentation

## 2014-01-01 DIAGNOSIS — Z79899 Other long term (current) drug therapy: Secondary | ICD-10-CM | POA: Insufficient documentation

## 2014-01-01 LAB — CBC WITH DIFFERENTIAL/PLATELET
BASOS PCT: 0 % (ref 0–1)
Basophils Absolute: 0 10*3/uL (ref 0.0–0.1)
EOS PCT: 0 % (ref 0–5)
Eosinophils Absolute: 0 10*3/uL (ref 0.0–0.7)
HCT: 41 % (ref 39.0–52.0)
Hemoglobin: 14 g/dL (ref 13.0–17.0)
LYMPHS PCT: 24 % (ref 12–46)
Lymphs Abs: 2.5 10*3/uL (ref 0.7–4.0)
MCH: 28.3 pg (ref 26.0–34.0)
MCHC: 34.1 g/dL (ref 30.0–36.0)
MCV: 82.8 fL (ref 78.0–100.0)
Monocytes Absolute: 0.7 10*3/uL (ref 0.1–1.0)
Monocytes Relative: 7 % (ref 3–12)
Neutro Abs: 7.4 10*3/uL (ref 1.7–7.7)
Neutrophils Relative %: 69 % (ref 43–77)
PLATELETS: 262 10*3/uL (ref 150–400)
RBC: 4.95 MIL/uL (ref 4.22–5.81)
RDW: 13.6 % (ref 11.5–15.5)
WBC: 10.7 10*3/uL — AB (ref 4.0–10.5)

## 2014-01-01 LAB — URINALYSIS, ROUTINE W REFLEX MICROSCOPIC
Bilirubin Urine: NEGATIVE
Glucose, UA: NEGATIVE mg/dL
KETONES UR: NEGATIVE mg/dL
NITRITE: NEGATIVE
PROTEIN: 100 mg/dL — AB
Specific Gravity, Urine: 1.02 (ref 1.005–1.030)
Urobilinogen, UA: 0.2 mg/dL (ref 0.0–1.0)
pH: 7.5 (ref 5.0–8.0)

## 2014-01-01 LAB — BASIC METABOLIC PANEL
BUN: 18 mg/dL (ref 6–23)
CO2: 21 mEq/L (ref 19–32)
CREATININE: 1.15 mg/dL (ref 0.50–1.35)
Calcium: 10.6 mg/dL — ABNORMAL HIGH (ref 8.4–10.5)
Chloride: 95 mEq/L — ABNORMAL LOW (ref 96–112)
GFR calc non Af Amer: 64 mL/min — ABNORMAL LOW (ref >90)
GFR, EST AFRICAN AMERICAN: 74 mL/min — AB (ref >90)
Glucose, Bld: 104 mg/dL — ABNORMAL HIGH (ref 70–99)
Potassium: 4.1 mEq/L (ref 3.7–5.3)
Sodium: 131 mEq/L — ABNORMAL LOW (ref 137–147)

## 2014-01-01 LAB — URINE MICROSCOPIC-ADD ON

## 2014-01-01 MED ORDER — DOCUSATE SODIUM 100 MG PO CAPS
100.0000 mg | ORAL_CAPSULE | Freq: Two times a day (BID) | ORAL | Status: DC
  Administered 2014-01-01 – 2014-01-03 (×5): 100 mg via ORAL
  Filled 2014-01-01 (×6): qty 1

## 2014-01-01 MED ORDER — ACETAMINOPHEN 325 MG PO TABS: 650.0000 mg | ORAL_TABLET | ORAL | Status: DC | PRN

## 2014-01-01 MED ORDER — HYOSCYAMINE SULFATE 0.125 MG SL SUBL
0.1250 mg | SUBLINGUAL_TABLET | SUBLINGUAL | Status: DC | PRN
  Administered 2014-01-03: 0.125 mg via ORAL
  Filled 2014-01-01: qty 1

## 2014-01-01 MED ORDER — KCL IN DEXTROSE-NACL 20-5-0.45 MEQ/L-%-% IV SOLN
INTRAVENOUS | Status: DC
  Administered 2014-01-01 – 2014-01-03 (×5): via INTRAVENOUS
  Filled 2014-01-01 (×6): qty 1000

## 2014-01-01 MED ORDER — DIPHENHYDRAMINE HCL 12.5 MG/5ML PO ELIX: 12.5000 mg | ORAL_SOLUTION | Freq: Four times a day (QID) | ORAL | Status: DC | PRN

## 2014-01-01 MED ORDER — HYDROCODONE-ACETAMINOPHEN 5-325 MG PO TABS
1.0000 | ORAL_TABLET | ORAL | Status: DC | PRN
  Administered 2014-01-03: 1 via ORAL
  Filled 2014-01-01: qty 1

## 2014-01-01 MED ORDER — IOHEXOL 300 MG/ML  SOLN
50.0000 mL | Freq: Once | INTRAMUSCULAR | Status: AC | PRN
  Administered 2014-01-01: 50 mL via ORAL

## 2014-01-01 MED ORDER — BISACODYL 10 MG RE SUPP: 10.0000 mg | Freq: Every day | RECTAL | Status: DC | PRN

## 2014-01-01 MED ORDER — ONDANSETRON HCL 4 MG/2ML IJ SOLN: 4.0000 mg | INTRAMUSCULAR | Status: DC | PRN

## 2014-01-01 MED ORDER — IOHEXOL 300 MG/ML  SOLN
100.0000 mL | Freq: Once | INTRAMUSCULAR | Status: AC | PRN
  Administered 2014-01-01: 100 mL via INTRAVENOUS

## 2014-01-01 MED ORDER — HYDROCHLOROTHIAZIDE 12.5 MG PO CAPS
12.5000 mg | ORAL_CAPSULE | Freq: Every day | ORAL | Status: DC
  Administered 2014-01-01 – 2014-01-03 (×3): 12.5 mg via ORAL
  Filled 2014-01-01 (×3): qty 1

## 2014-01-01 MED ORDER — ZOLPIDEM TARTRATE 5 MG PO TABS
5.0000 mg | ORAL_TABLET | Freq: Every evening | ORAL | Status: DC | PRN
  Filled 2014-01-01: qty 1

## 2014-01-01 MED ORDER — HYDROMORPHONE HCL PF 1 MG/ML IJ SOLN
0.5000 mg | INTRAMUSCULAR | Status: DC | PRN
  Administered 2014-01-02: 1 mg via INTRAVENOUS
  Filled 2014-01-01 (×2): qty 1

## 2014-01-01 MED ORDER — DIPHENHYDRAMINE HCL 50 MG/ML IJ SOLN: 12.5000 mg | Freq: Four times a day (QID) | INTRAMUSCULAR | Status: DC | PRN

## 2014-01-01 NOTE — ED Notes (Signed)
Urology MD at bedside

## 2014-01-01 NOTE — ED Notes (Addendum)
Patient transported to CT 

## 2014-01-01 NOTE — ED Notes (Signed)
Pt complains of not being able to urinate since yesterday

## 2014-01-01 NOTE — ED Notes (Signed)
Pt going to CT prior to going upstairs

## 2014-01-01 NOTE — ED Notes (Signed)
Pt's 16 fr foley was occluded, after instillation of 20  ml's sterile water 500 ml's of frank bright red urine w/ TNTC blood clots returned.  22 three way catheter placed in sterile fashion.

## 2014-01-01 NOTE — ED Notes (Signed)
Bladder scanner showed greater than 600cc of urine

## 2014-01-01 NOTE — ED Notes (Signed)
Pt was suppose to have a cystectomy after September and has put the procedure off, he now complains of urinary retention and pain. Inserted foley catheter and there was copious amounts of red urine with clots.

## 2014-01-01 NOTE — Progress Notes (Signed)
Pt reinstructed in use of bedside IS, & could blow a 1250, enc pt to use 10 times each hr while awake to expand lung fields to prevent fevers & risk of PNA. Pt verbalized understanding, & has used IS on other trips to hospital last year

## 2014-01-01 NOTE — Care Management Note (Addendum)
    Page 1 of 1   01/04/2014     2:03:46 PM   CARE MANAGEMENT NOTE 01/04/2014  Patient:  Dustin Jordan, Dustin Jordan   Account Number:  [id number]  Date Initiated:  01/01/2014  Documentation initiated by:  [facility]  Subjective/Objective Assessment:   [address] [place] W/HEMATURIA,RETENTION.     Action/Plan:   FROM HOME.HAS PCP,PHARMACY.   Anticipated DC Date:  01/03/2014   Anticipated DC Plan:  [place]  CM consult      Choice offered to / List presented to:             Status of service:  In process, will continue to follow Medicare Important Message given?   (If response is "NO", the following Medicare IM given date fields will be blank) Date Medicare IM given:   Date Additional Medicare IM given:    Discharge Disposition:    Per UR Regulation:  Reviewed for med. necessity/level of care/duration of stay  If discussed at [place] of Stay Meetings, dates discussed:    Comments:  01/04/14 Omare Bilotta RN,BSN NCM [phone] D/C YESTERDAY NO NEEDS.  01/01/14 Sieanna Vanstone RN,BSN NCM [phone] NO ANTICIPATED D/C NEEDS.

## 2014-01-01 NOTE — H&P (Addendum)
Subjective: Dustin Jordan is 68 yo BM who I was asked to see in consultation by Dr. Kathrynn Humble for hematuria with retention.   Dustin Jordan is a patient of Dr. 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  He was to have a partial cystectomy but failed to return in f/u.   Since he has had intermittant hematuria and progressive bleeding over the last few days and developed urinary retention with marked suprapubic pain.   He had no prior dysuria or fever.    ROS:  Review of Systems  Constitutional: Negative for fever, chills, weight loss and malaise/fatigue.  HENT: Negative.   Eyes: Negative.   Respiratory: Positive for shortness of breath. Negative for cough and wheezing.   Cardiovascular: Negative.   Gastrointestinal: Positive for abdominal pain. Negative for nausea, vomiting, diarrhea, constipation and blood in stool.  Genitourinary: Positive for hematuria. Negative for dysuria.  Musculoskeletal: Positive for back pain (with DJD). Negative for joint pain.  Skin: Positive for itching. Negative for rash.  Neurological: Negative.  Negative for weakness.  Endo/Heme/Allergies: Negative.   Psychiatric/Behavioral: Negative.     Anti-infectives: Anti-infectives   None      No current facility-administered medications for this encounter.   Current Outpatient Prescriptions  Medication Sig Dispense Refill  . Aspirin-Salicylamide-Caffeine (BC HEADACHE POWDER PO) Take 1 packet by mouth every 4 (four) hours as needed (headache).      . hydrochlorothiazide (MICROZIDE) 12.5 MG capsule Take 1 capsule (12.5 mg total) by mouth daily.  90 capsule  3   No Known Allergies  Past Medical History  Diagnosis Date  . Hypertension   . White coat hypertension   . Swelling of right knee joint     s/p arthroscopy 06-08-2013  . Bladder cancer   . Hematuria   . Frequency of urination     Past Surgical History  Procedure Laterality  Date  . Knee arthroscopy Right 06-08-2013  . Cystoscopy w/ retrogrades Bilateral 06/22/2013    Procedure: CYSTOSCOPY WITH RETROGRADE PYELOGRAM;  Surgeon: Alexis Frock, MD;  Location: [facility];  Service: Urology;  Laterality: Bilateral;  . Transurethral resection of bladder tumor with gyrus (turbt-gyrus) N/A 06/22/2013    Procedure: TRANSURETHRAL RESECTION OF BLADDER TUMOR WITH GYRUS (TURBT-GYRUS);  Surgeon: Alexis Frock, MD;  Location: [facility];  Service: Urology;  Laterality: N/A;    History   Social History  . Marital Status: Single    Spouse Name: N/A    Number of Children: N/A  . Years of Education: N/A   Occupational History  . Not on file.   Social History Main Topics  . Smoking status: Former Smoker -- 1.00 packs/day for 20 years    Types: Cigarettes    Quit date: 06/15/2010  . Smokeless tobacco: Never Used  . Alcohol Use: No  . Drug Use: No  . Sexual Activity: Not on file   Other Topics Concern  . Not on file   Social History Narrative  . No narrative on file    History reviewed. No pertinent family history.    Objective: Vital signs in last 24 hours: Pulse Rate:  [73-104] 73 (04/05 0531) Resp:  [20] 20 (04/05 0427) BP: (146-192)/(82-124) 146/82 mmHg (04/05 0531) SpO2:  [98 %] 98 % (04/05 0427) Weight:  [86.183 kg (190 lb)] 86.183 kg (190 lb) (04/05 0427)  Intake/Output from previous day:   Intake/Output this shift:  Physical Exam  Constitutional: He is oriented to person, place, and time and well-developed, well-nourished, and in no distress. No distress.  HENT:  Head: Normocephalic and atraumatic.  Eyes: Conjunctivae are normal. Pupils are equal, round, and reactive to light.  Neck: Normal range of motion. Neck supple.  Cardiovascular: Normal rate, regular rhythm and normal heart sounds.   Pulmonary/Chest: Effort normal and breath sounds normal. No respiratory distress. He has no wheezes.  Abdominal: Soft.  Bowel sounds are normal. He exhibits no distension and no mass. There is no tenderness. There is no guarding.  No hernias  Genitourinary: Penis normal.  Foley at meatus now draining light pink urine.   Musculoskeletal: Normal range of motion. He exhibits no edema and no tenderness.  Lymphadenopathy:    He has no cervical adenopathy.  He has no supraclavicular, axillary or inguinal adenopathy.    Neurological: He is alert and oriented to person, place, and time.  Skin: Skin is warm and dry.  Psychiatric: Mood and affect normal.    Lab Results:   Recent Labs  01/01/14 0704  WBC 10.7*  HGB 14.0  HCT 41.0  PLT 262   BMET  Recent Labs  01/01/14 0704  NA 131*  K 4.1  CL 95*  CO2 21  GLUCOSE 104*  BUN 18  CREATININE 1.15  CALCIUM 10.6*   PT/INR No results found for this basename: LABPROT, INR,  in the last 72 hours ABG No results found for this basename: PHART, PCO2, PO2, HCO3,  in the last 72 hours  Studies/Results: No results found.  I have reviewed his prior records, today's labs and discussed his case with Dr. Kathrynn Humble.   Addendum:   CT Abd/pelvis shows a 3.1 x 2.8cm mass at the dome of the bladder consistent with his history of invasive bladder cancer.   No obvious mets are noted.    CXR is remarkable only for left deviation of the trachea with an enlarged thyroid or goiter suspected.  He may still be a candidate for local therapy with partial vs total cystectomy.   I will defer further evaluation of the tracheal deviation to Dr. [name].   Assessment: Dustin Jordan has clot retention with a history of an invasive bladder cancer that has not been definitively treated.  He has a 34fr foley and was irrigated with return on 500cc of bloody urine with clots.  Plan: I am going to keep him overnight for observation and will order a CT and CXR for restaging purposes and will notify Dr. [name] of his admission.   CC: Dr. Varney Biles    LOS: 0 days    Malka So 01/01/2014

## 2014-01-02 ENCOUNTER — Observation Stay (HOSPITAL_COMMUNITY): Payer: Medicare Other

## 2014-01-02 ENCOUNTER — Encounter (HOSPITAL_COMMUNITY): Payer: Medicare Other | Admitting: Anesthesiology

## 2014-01-02 ENCOUNTER — Encounter (HOSPITAL_COMMUNITY)
Admission: EM | Disposition: A | Discharge status: Home / Self Care | Payer: Self-pay | Source: Home / Self Care | Attending: Emergency Medicine

## 2014-01-02 ENCOUNTER — Observation Stay (HOSPITAL_COMMUNITY): Payer: Medicare Other | Admitting: Anesthesiology

## 2014-01-02 ENCOUNTER — Encounter (HOSPITAL_COMMUNITY): Payer: Self-pay | Admitting: Radiology

## 2014-01-02 HISTORY — PX: CYSTOSCOPY WITH RETROGRADE PYELOGRAM, URETEROSCOPY AND STENT PLACEMENT: SHX5789

## 2014-01-02 LAB — SURGICAL PCR SCREEN
MRSA, PCR: NEGATIVE
Staphylococcus aureus: NEGATIVE

## 2014-01-02 LAB — HEMOGLOBIN AND HEMATOCRIT, BLOOD
HCT: 36.7 % — ABNORMAL LOW (ref 39.0–52.0)
Hemoglobin: 11.9 g/dL — ABNORMAL LOW (ref 13.0–17.0)

## 2014-01-02 SURGERY — CYSTOURETEROSCOPY, WITH RETROGRADE PYELOGRAM AND STENT INSERTION
Anesthesia: General | Laterality: Bilateral

## 2014-01-02 MED ORDER — LACTATED RINGERS IV SOLN
INTRAVENOUS | Status: DC | PRN
  Administered 2014-01-02: 18:00:00 via INTRAVENOUS

## 2014-01-02 MED ORDER — GENTAMICIN SULFATE 40 MG/ML IJ SOLN
5.0000 mg/kg | INTRAVENOUS | Status: DC
  Administered 2014-01-02: 420 mg via INTRAVENOUS
  Filled 2014-01-02 (×2): qty 10.5

## 2014-01-02 MED ORDER — LIDOCAINE HCL (CARDIAC) 20 MG/ML IV SOLN
INTRAVENOUS | Status: AC
  Filled 2014-01-02: qty 5

## 2014-01-02 MED ORDER — MIDAZOLAM HCL 2 MG/2ML IJ SOLN
INTRAMUSCULAR | Status: AC
  Filled 2014-01-02: qty 2

## 2014-01-02 MED ORDER — FENTANYL CITRATE 0.05 MG/ML IJ SOLN: 25.0000 ug | INTRAMUSCULAR | Status: DC | PRN

## 2014-01-02 MED ORDER — TAMSULOSIN HCL 0.4 MG PO CAPS
0.4000 mg | ORAL_CAPSULE | Freq: Every day | ORAL | Status: DC
  Administered 2014-01-02 – 2014-01-03 (×2): 0.4 mg via ORAL
  Filled 2014-01-02 (×3): qty 1

## 2014-01-02 MED ORDER — FENTANYL CITRATE 0.05 MG/ML IJ SOLN
INTRAMUSCULAR | Status: DC | PRN
  Administered 2014-01-02: 50 ug via INTRAVENOUS
  Administered 2014-01-02: 100 ug via INTRAVENOUS
  Administered 2014-01-02 (×2): 50 ug via INTRAVENOUS
  Administered 2014-01-02: 100 ug via INTRAVENOUS

## 2014-01-02 MED ORDER — MIDAZOLAM HCL 5 MG/5ML IJ SOLN
INTRAMUSCULAR | Status: DC | PRN
  Administered 2014-01-02: 2 mg via INTRAVENOUS

## 2014-01-02 MED ORDER — FENTANYL CITRATE 0.05 MG/ML IJ SOLN
INTRAMUSCULAR | Status: AC
  Filled 2014-01-02: qty 5

## 2014-01-02 MED ORDER — PROPOFOL 10 MG/ML IV BOLUS
INTRAVENOUS | Status: AC
Start: 2014-01-02 — End: 2014-01-02
  Filled 2014-01-02: qty 20

## 2014-01-02 MED ORDER — IOHEXOL 300 MG/ML  SOLN
INTRAMUSCULAR | Status: DC | PRN
  Administered 2014-01-02: 20 mL via INTRAVENOUS

## 2014-01-02 MED ORDER — ONDANSETRON HCL 4 MG/2ML IJ SOLN
INTRAMUSCULAR | Status: DC | PRN
  Administered 2014-01-02: 4 mg via INTRAVENOUS

## 2014-01-02 MED ORDER — PROPOFOL 10 MG/ML IV BOLUS
INTRAVENOUS | Status: DC | PRN
  Administered 2014-01-02: 200 mg via INTRAVENOUS

## 2014-01-02 MED ORDER — ACETAMINOPHEN 10 MG/ML IV SOLN
INTRAVENOUS | Status: DC | PRN
  Administered 2014-01-02: 1000 mg via INTRAVENOUS

## 2014-01-02 MED ORDER — FENTANYL CITRATE 0.05 MG/ML IJ SOLN
INTRAMUSCULAR | Status: AC
  Filled 2014-01-02: qty 2

## 2014-01-02 MED ORDER — LIDOCAINE HCL (CARDIAC) 10 MG/ML IV SOLN
INTRAVENOUS | Status: DC | PRN
  Administered 2014-01-02: 100 mg via INTRAVENOUS

## 2014-01-02 MED ORDER — LIDOCAINE HCL 2 % EX GEL
CUTANEOUS | Status: AC
  Filled 2014-01-02: qty 10

## 2014-01-02 MED ORDER — MEPERIDINE HCL 50 MG/ML IJ SOLN: 6.2500 mg | INTRAMUSCULAR | Status: DC | PRN

## 2014-01-02 MED ORDER — PROMETHAZINE HCL 25 MG/ML IJ SOLN: 6.2500 mg | INTRAMUSCULAR | Status: DC | PRN

## 2014-01-02 MED ORDER — IOHEXOL 300 MG/ML  SOLN
80.0000 mL | Freq: Once | INTRAMUSCULAR | Status: AC | PRN
  Administered 2014-01-02: 80 mL via INTRAVENOUS

## 2014-01-02 MED ORDER — ACETAMINOPHEN 10 MG/ML IV SOLN
1000.0000 mg | Freq: Once | INTRAVENOUS | Status: DC
  Filled 2014-01-02: qty 100

## 2014-01-02 MED ORDER — ONDANSETRON HCL 4 MG/2ML IJ SOLN
INTRAMUSCULAR | Status: AC
  Filled 2014-01-02: qty 2

## 2014-01-02 MED ORDER — BELLADONNA ALKALOIDS-OPIUM 16.2-60 MG RE SUPP
RECTAL | Status: AC
  Filled 2014-01-02: qty 1

## 2014-01-02 SURGICAL SUPPLY — 27 items
BAG URINE DRAINAGE (UROLOGICAL SUPPLIES) ×5 IMPLANT
BASKET LASER NITINOL 1.9FR (BASKET) IMPLANT
BASKET STNLS GEMINI 4WIRE 3FR (BASKET) IMPLANT
BASKET ZERO TIP NITINOL 2.4FR (BASKET) IMPLANT
BSKT STON RTRVL 120 1.9FR (BASKET)
BSKT STON RTRVL GEM 120X11 3FR (BASKET)
BSKT STON RTRVL ZERO TP 2.4FR (BASKET)
CATH FOLEY 3WAY 30CC 24FR (CATHETERS) ×3
CATH FOLEY 3WAY 30CC 26FR (CATHETERS) ×2 IMPLANT
CATH INTERMIT  6FR 70CM (CATHETERS) ×3 IMPLANT
CATH URTH STD 24FR FL 3W 2 (CATHETERS) IMPLANT
CLOTH BEACON ORANGE TIMEOUT ST (SAFETY) ×3 IMPLANT
DRAPE CAMERA CLOSED 9X96 (DRAPES) ×3 IMPLANT
ELECT LOOP MED HF 24F 12D CBL (CLIP) ×2 IMPLANT
ELECT REM PT RETURN 9FT ADLT (ELECTROSURGICAL)
ELECTRODE REM PT RTRN 9FT ADLT (ELECTROSURGICAL) IMPLANT
FIBER LASER FLEXIVA 200 (UROLOGICAL SUPPLIES) IMPLANT
FIBER LASER FLEXIVA 365 (UROLOGICAL SUPPLIES) IMPLANT
GLOVE BIOGEL M STRL SZ7.5 (GLOVE) ×3 IMPLANT
GOWN STRL REUS W/TWL LRG LVL3 (GOWN DISPOSABLE) ×3 IMPLANT
GUIDEWIRE ANG ZIPWIRE 038X150 (WIRE) ×3 IMPLANT
GUIDEWIRE STR DUAL SENSOR (WIRE) ×3 IMPLANT
IV NS IRRIG 3000ML ARTHROMATIC (IV SOLUTION) ×3 IMPLANT
PACK CYSTO (CUSTOM PROCEDURE TRAY) ×3 IMPLANT
SYRINGE 10CC LL (SYRINGE) IMPLANT
SYRINGE IRR TOOMEY STRL 70CC (SYRINGE) IMPLANT
TUBE FEEDING 8FR 16IN STR KANG (MISCELLANEOUS) ×3 IMPLANT

## 2014-01-02 NOTE — Progress Notes (Signed)
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 Matilde Sprang on work-up of gross hematuria. TURBT 05/2013 with at least T1G3 at dome and planned for partial cystectomy. Did not follow up and represted 12/2013 with clot retention and CT Urogram w/o distant disease but recurrence of large dome tumor and sig perivesical component worrisome for possibel T3 disease. Prior smoker, now quit.   Today Dustin Jordan is seen to proceed with restaing transurethral resection of his bladder tumor for diagnostic / staging / and theraputic intent to see if he is still candidate for partial cystectomy. His hematuria that he had on admission as essentially resolved.    Objective: Vital signs in last 24 hours: Temp:  [97.4 F (36.3 C)-98.3 F (36.8 C)] 97.7 F (36.5 C) (04/06 1355) Pulse Rate:  [59-66] 62 (04/06 1355) Resp:  [16-18] 16 (04/06 1355) BP: (128-141)/(73-79) 128/75 mmHg (04/06 1355) SpO2:  [100 %] 100 % (04/06 1355) Last BM Date: 01/01/14  Intake/Output from previous day: 04/05 0701 - 04/06 0700 In: 2150 [P.O.:600; I.V.:1550] Out: 4100 [Urine:4100] Intake/Output this shift: Total I/O In: 652.5 [I.V.:652.5] Out: 1150 [Urine:1150]  General appearance: alert, cooperative and appears stated age Head: Normocephalic, without obvious abnormality, atraumatic Back: symmetric, no curvature. ROM normal. No CVA tenderness. Resp: no labored breathing Cardio: regular rate and rhythm, S1, S2 normal, no murmur, click, rub or gallop GI: soft, non-tender; bowel sounds normal; no masses,  no organomegaly Male genitalia: normal, foley c/d/i with grossly clear urine.  Extremities: extremities normal, atraumatic, no cyanosis or edema Pulses: 2+ and symmetric Skin: Skin color, texture, turgor normal. No rashes or lesions Lymph nodes: Cervical, supraclavicular, and axillary nodes normal. Neurologic: Grossly normal  Lab Results:   Recent Labs   01/01/14 0704 01/02/14 0446  WBC 10.7*  --   HGB 14.0 11.9*  HCT 41.0 36.7*  PLT 262  --    BMET  Recent Labs  01/01/14 0704  NA 131*  K 4.1  CL 95*  CO2 21  GLUCOSE 104*  BUN 18  CREATININE 1.15  CALCIUM 10.6*   PT/INR No results found for this basename: LABPROT, INR,  in the last 72 hours ABG No results found for this basename: PHART, PCO2, PO2, HCO3,  in the last 72 hours  Studies/Results: X-ray Chest Pa And Lateral   01/01/2014   CLINICAL DATA:  Bladder cancer, hypertension  EXAM: CHEST  2 VIEW  COMPARISON:  None.  FINDINGS: Normal heart size and central vascularity. No focal pneumonia, collapse or consolidation. Negative for edema, effusion or pneumothorax. Leftward tracheal deviation with prominent right paratracheal density, suspect thyroid enlargement versus goiter.  IMPRESSION: No active chest disease  Leftward tracheal deviation, suspect enlarged thyroid   Electronically Signed   By: Daryll Brod M.D.   On: 01/01/2014 08:54   Ct Soft Tissue Neck W Contrast  01/02/2014   CLINICAL DATA:  Tracheal deviation. History of bladder cancer. Rule out cervical lymphadenopathy versus goiter.  EXAM: CT NECK WITH CONTRAST  TECHNIQUE: Multidetector CT imaging of the neck was performed using the standard protocol following the bolus administration of intravenous contrast.  CONTRAST:  9mL OMNIPAQUE IOHEXOL 300 MG/ML  SOLN  COMPARISON:  Chest radiographs 01/01/2014  FINDINGS: The visualized portion of the brain and orbits is unremarkable. Right ethmoid air cell mucosal thickening is partially visualized. There is trace left maxillary sinus mucosal thickening. Mastoid air cells are clear.  The nasopharynx, oral cavity, and  oropharynx are unremarkable. The right vocal cord appears medialized with enlargement of the right laryngeal ventricle and right piriform sinus.  There is diffuse enlargement of the right thyroid lobe, which measures approximately 3.7 x 4.0 x 6.6 cm (transverse by AP by  craniocaudal). The right thyroid lobe is heterogeneous in density containing multiple hypoattenuating nodules. The left thyroid lobe is normal in size without focal abnormality identified. The right thyroid lobe enlargement results in leftward tracheal deviation as seen on recent chest radiographs. The airway remains widely patent.  The submandibular and parotid glands are unremarkable. No enlarged cervical lymph nodes are identified. Moderate atherosclerotic calcifications present at the carotid bifurcations without evidence of significant stenosis. Scarring is noted in the lung apices. There is also mild centrilobular emphysema. Moderate multilevel cervical spondylosis is present.  IMPRESSION: 1. Diffuse enlargement and heterogeneity of the right thyroid lobe, consistent with goiter. 2. No enlarged cervical lymph nodes. 3. Findings of right vocal cord paralysis.   Electronically Signed   By: Logan Bores   On: 01/02/2014 09:42   Ct Abdomen Pelvis W Contrast  01/01/2014   CLINICAL DATA:  Hematuria  EXAM: CT ABDOMEN AND PELVIS WITH CONTRAST  TECHNIQUE: Multidetector CT imaging of the abdomen and pelvis was performed using the standard protocol following bolus administration of intravenous contrast.  CONTRAST:  172mL OMNIPAQUE IOHEXOL 300 MG/ML SOLN, 51mL OMNIPAQUE IOHEXOL 300 MG/ML SOLN  COMPARISON:  06/22/2013  FINDINGS: Lung bases are free of acute infiltrate or sizable effusion.  The liver, gallbladder, spleen, adrenal glands and pancreas are within normal limits. Kidneys are well visualized bilaterally and reveal no evidence of renal calculi or urinary tract obstructive changes. A few small cystic areas are noted within the kidneys. No obstructive changes are seen.  The appendix is within normal limits. The bladder is partially distended. A Foley catheter is noted in place. In the superior anterior aspect of the urinary bladder there is a focal mass lesion identified which measures 3.1 x 2.8 cm consistent with  the patient's given clinical history of bladder carcinoma. Additionally there are areas of increased attenuation identified on the arterial phase imaging suggestive of active hemorrhage. This would correspond with the patient's given clinical history. A small calcification is noted posteriorly in the bladder likely representing a small stone. Aortic calcifications are noted without aneurysmal dilatation. No pelvic mass lesion or sidewall abnormality is noted. The osseous structures are within normal limits.  IMPRESSION: Bladder mass with findings suggestive of active hemorrhage. This is consistent with the patient's given clinical history.   Electronically Signed   By: Inez Catalina M.D.   On: 01/01/2014 09:51    Anti-infectives: Anti-infectives   Start     Dose/Rate Route Frequency Ordered Stop   01/02/14 1530  [MAR Hold]  gentamicin (GARAMYCIN) 420 mg in dextrose 5 % 100 mL IVPB     (On MAR Hold since 01/02/14 1732)   5 mg/kg  83.6 kg 221 mL/hr over 30 Minutes Intravenous Every 24 hours 01/02/14 1453        Assessment/Plan:  1 - Bladder Cancer - Proceed as planned today with re-staging TURBT. Risks including bleeding, infection, bladder perforation, non-cure as well as rare risks such as DVT, PE, MI, CVA, Mortality discussed.  Plan for observe overnight and hopefull DC tomorrow with or without foley pending procedure today and amt hematuria.   He understands that he still needs at least partial cystetomy for his potentially lethal malignancy.  [facility], Kewon Statler 01/02/2014

## 2014-01-02 NOTE — ED Provider Notes (Signed)
CSN: [id number]     Arrival date & time 01/01/14  0407 History   First MD Initiated Contact with Patient 01/01/14 [phone]     Chief Complaint  Patient presents with  . Urinary Retention     (Consider location/radiation/quality/duration/timing/severity/associated sxs/prior Treatment) HPI Comments: 69 y/o M with hx of HTN, bladder CA comes in with cc of urinary retention. Pt states that he has not urinated since a day ago. He is having bladder pain. No n/v/f/c. Pt has hematuria, but that is nor new for him. States that he was supposed to get some surgery done for his cancer, that he has been deferring.  The history is provided by the patient.    Past Medical History  Diagnosis Date  . Hypertension   . White coat hypertension   . Swelling of right knee joint     s/p arthroscopy 06-08-2013  . Bladder cancer   . Hematuria   . Frequency of urination    Past Surgical History  Procedure Laterality Date  . Knee arthroscopy Right 06-08-2013  . Cystoscopy w/ retrogrades Bilateral 06/22/2013    Procedure: CYSTOSCOPY WITH RETROGRADE PYELOGRAM;  Surgeon: Alexis Frock, MD;  Location: [facility];  Service: Urology;  Laterality: Bilateral;  . Transurethral resection of bladder tumor with gyrus (turbt-gyrus) N/A 06/22/2013    Procedure: TRANSURETHRAL RESECTION OF BLADDER TUMOR WITH GYRUS (TURBT-GYRUS);  Surgeon: Alexis Frock, MD;  Location: [facility];  Service: Urology;  Laterality: N/A;   History reviewed. No pertinent family history. History  Substance Use Topics  . Smoking status: Former Smoker -- 1.00 packs/day for 20 years    Types: Cigarettes    Quit date: 06/15/2010  . Smokeless tobacco: Never Used  . Alcohol Use: No    Review of Systems  Constitutional: Negative for activity change and appetite change.  Respiratory: Negative for cough and shortness of breath.   Cardiovascular: Negative for chest pain.  Gastrointestinal: Negative for abdominal pain.   Genitourinary: Positive for hematuria. Negative for dysuria.      Allergies  Review of patient's allergies indicates no known allergies.  Home Medications  No current outpatient prescriptions on file. BP 129/79  Pulse 59  Temp(Src) 97.4 F (36.3 C) (Oral)  Resp 18  Ht 6\' 3"  (1.905 m)  Wt 184 lb 3.2 oz (83.553 kg)  BMI 23.02 kg/m2  SpO2 100% Physical Exam  Nursing note and vitals reviewed. Constitutional: He is oriented to person, place, and time. He appears well-developed.  HENT:  Head: Normocephalic and atraumatic.  Eyes: Conjunctivae and EOM are normal. Pupils are equal, round, and reactive to light.  Neck: Normal range of motion. Neck supple.  Cardiovascular: Normal rate and regular rhythm.   Pulmonary/Chest: Effort normal and breath sounds normal.  Abdominal: Soft. Bowel sounds are normal. He exhibits no distension. There is no tenderness. There is no rebound and no guarding.  Genitourinary:  450 cc of urine in the foley bag, with gross hematuria.  Neurological: He is alert and oriented to person, place, and time.  Skin: Skin is warm.    ED Course  Procedures (including critical care time) Labs Review Labs Reviewed  URINALYSIS, ROUTINE W REFLEX MICROSCOPIC - Abnormal; Notable for the following:    Color, Urine RED (*)    APPearance CLOUDY (*)    Hgb urine dipstick LARGE (*)    Protein, ur 100 (*)    Leukocytes, UA SMALL (*)    All other components within normal limits  BASIC  METABOLIC PANEL - Abnormal; Notable for the following:    Sodium 131 (*)    Chloride 95 (*)    Glucose, Bld 104 (*)    Calcium 10.6 (*)    GFR calc non Af Amer 64 (*)    GFR calc Af Amer 74 (*)    All other components within normal limits  CBC WITH DIFFERENTIAL - Abnormal; Notable for the following:    WBC 10.7 (*)    All other components within normal limits  HEMOGLOBIN AND HEMATOCRIT, BLOOD - Abnormal; Notable for the following:    Hemoglobin 11.9 (*)    HCT 36.7 (*)    All  other components within normal limits  URINE CULTURE  SURGICAL PCR SCREEN  URINE MICROSCOPIC-ADD ON   Imaging Review X-ray Chest Pa And Lateral   01/01/2014   CLINICAL DATA:  Bladder cancer, hypertension  EXAM: CHEST  2 VIEW  COMPARISON:  None.  FINDINGS: Normal heart size and central vascularity. No focal pneumonia, collapse or consolidation. Negative for edema, effusion or pneumothorax. Leftward tracheal deviation with prominent right paratracheal density, suspect thyroid enlargement versus goiter.  IMPRESSION: No active chest disease  Leftward tracheal deviation, suspect enlarged thyroid   Electronically Signed   By: Daryll Brod M.D.   On: 01/01/2014 08:54   Ct Abdomen Pelvis W Contrast  01/01/2014   CLINICAL DATA:  Hematuria  EXAM: CT ABDOMEN AND PELVIS WITH CONTRAST  TECHNIQUE: Multidetector CT imaging of the abdomen and pelvis was performed using the standard protocol following bolus administration of intravenous contrast.  CONTRAST:  11mL OMNIPAQUE IOHEXOL 300 MG/ML SOLN, 1mL OMNIPAQUE IOHEXOL 300 MG/ML SOLN  COMPARISON:  06/22/2013  FINDINGS: Lung bases are free of acute infiltrate or sizable effusion.  The liver, gallbladder, spleen, adrenal glands and pancreas are within normal limits. Kidneys are well visualized bilaterally and reveal no evidence of renal calculi or urinary tract obstructive changes. A few small cystic areas are noted within the kidneys. No obstructive changes are seen.  The appendix is within normal limits. The bladder is partially distended. A Foley catheter is noted in place. In the superior anterior aspect of the urinary bladder there is a focal mass lesion identified which measures 3.1 x 2.8 cm consistent with the patient's given clinical history of bladder carcinoma. Additionally there are areas of increased attenuation identified on the arterial phase imaging suggestive of active hemorrhage. This would correspond with the patient's given clinical history. A small  calcification is noted posteriorly in the bladder likely representing a small stone. Aortic calcifications are noted without aneurysmal dilatation. No pelvic mass lesion or sidewall abnormality is noted. The osseous structures are within normal limits.  IMPRESSION: Bladder mass with findings suggestive of active hemorrhage. This is consistent with the patient's given clinical history.   Electronically Signed   By: Inez Catalina M.D.   On: 01/01/2014 09:51     EKG Interpretation None      MDM   Final diagnoses:  Hematuria  Urinary retention    PT with bladder ca, awaiting another surgery comes in with urinary retention. Foley catheter placed and drained 450 cc. Unfortunately, the RNs placed a 16 Fr catheter. We will start irrigating the foley - Dr. Jeffie Pollock from Urology to see patient, and seems like he is admitting him to their service.  Varney Biles, MD 01/02/14 [phone]

## 2014-01-02 NOTE — Transfer of Care (Signed)
Immediate Anesthesia Transfer of Care Note  Patient: Dustin Jordan  Procedure(s) Performed: Procedure(s): CYSTOSCOPY WITH RETROGRADE PYELOGRAM BILATERAL URETERS  AND TRANSURETHRAL RESECTION OF A BLADDER TUMOR WITH MULTIPLE BX (Bilateral)  Patient Location: PACU  Anesthesia Type:General  Level of Consciousness: awake, alert , oriented and patient cooperative  Airway & Oxygen Therapy: Patient Spontanous Breathing and Patient connected to face mask oxygen  Post-op Assessment: Report given to PACU RN, Post -op Vital signs reviewed and stable and Patient moving all extremities X 4  Post vital signs: stable  Complications: No apparent anesthesia complications

## 2014-01-02 NOTE — Anesthesia Postprocedure Evaluation (Signed)
  Anesthesia Post-op Note  Patient: Dustin Jordan  Procedure(s) Performed: Procedure(s) (LRB): CYSTOSCOPY WITH RETROGRADE PYELOGRAM BILATERAL URETERS  AND TRANSURETHRAL RESECTION OF A BLADDER TUMOR WITH MULTIPLE BX (Bilateral)  Patient Location: PACU  Anesthesia Type: General  Level of Consciousness: awake and alert   Airway and Oxygen Therapy: Patient Spontanous Breathing  Post-op Pain: mild  Post-op Assessment: Post-op Vital signs reviewed, Patient's Cardiovascular Status Stable, Respiratory Function Stable, Patent Airway and No signs of Nausea or vomiting  Last Vitals:  Filed Vitals:   01/02/14 1947  BP: 175/85  Pulse: 61  Temp: 36.6 C  Resp: 15    Post-op Vital Signs: stable   Complications: No apparent anesthesia complications

## 2014-01-02 NOTE — Plan of Care (Signed)
Problem: Phase I Progression Outcomes Goal: Voiding-avoid urinary catheter unless indicated Outcome: Not Progressing Pt had foley catheter placed in the ED for urinary retention and hematuria. Pt to have TURBT today.

## 2014-01-02 NOTE — Anesthesia Preprocedure Evaluation (Signed)
Anesthesia Evaluation  Patient identified by MRN, date of birth, ID band Patient awake    Reviewed: Allergy & Precautions, H&P , NPO status , Patient's Chart, lab work & pertinent test results  Airway Mallampati: I TM Distance: >3 FB Neck ROM: Full    Dental  (+) Dental Advisory Given, Edentulous Upper   Pulmonary neg pulmonary ROS, former smoker,  breath sounds clear to auscultation        Cardiovascular hypertension, Pt. on medications Rhythm:Regular Rate:Normal     Neuro/Psych negative neurological ROS  negative psych ROS   GI/Hepatic negative GI ROS, Neg liver ROS,   Endo/Other  negative endocrine ROS  Renal/GU negative Renal ROS     Musculoskeletal negative musculoskeletal ROS (+)   Abdominal   Peds  Hematology negative hematology ROS (+)   Anesthesia Other Findings   Reproductive/Obstetrics                           Anesthesia Physical  Anesthesia Plan  ASA: II  Anesthesia Plan: General   Post-op Pain Management:    Induction: Intravenous  Airway Management Planned: LMA and Oral ETT  Additional Equipment:   Intra-op Plan:   Post-operative Plan: Extubation in OR  Informed Consent: I have reviewed the patients History and Physical, chart, labs and discussed the procedure including the risks, benefits and alternatives for the proposed anesthesia with the patient or authorized representative who has indicated his/her understanding and acceptance.   Dental advisory given  Plan Discussed with: CRNA  Anesthesia Plan Comments:         Anesthesia Quick Evaluation

## 2014-01-02 NOTE — Brief Op Note (Signed)
01/01/2014 - 01/02/2014  6:45 PM  PATIENT:  Dustin Jordan  68 y.o. male  PRE-OPERATIVE DIAGNOSIS:  bladder cancer  POST-OPERATIVE DIAGNOSIS:  * No post-op diagnosis entered *  PROCEDURE:  Procedure(s): CYSTOSCOPY WITH RETROGRADE PYELOGRAM, AND BILATERAL STENT PLACEMENT AND TRANSURETHRAL RESECTION OF A BLADDER TUMOR (Bilateral)  SURGEON:  Surgeon(s) and Role:    * Alexis Frock, MD - Primary  PHYSICIAN ASSISTANT:   ASSISTANTS: none   ANESTHESIA:   general  EBL:  Total I/O In: 877.5 [I.V.:877.5] Out: 1150 [Urine:1150]  BLOOD ADMINISTERED:none  DRAINS: 82F 3 way foley, irrigation port plugged   LOCAL MEDICATIONS USED:  none  SPECIMEN:  Source of Specimen:  1 - bladder dome mass, 2 - Rt, Lt, Anterior, Posterior bladder mass edges, 3 - bladder erythema  DISPOSITION OF SPECIMEN:  PATHOLOGY  COUNTS:  YES  TOURNIQUET:  * No tourniquets in log *  DICTATION: .Other Dictation: Dictation Number [id number]  PLAN OF CARE: Admit for overnight observation  PATIENT DISPOSITION:  PACU - hemodynamically stable.   Delay start of Pharmacological VTE agent (>24hrs) due to surgical blood loss or risk of bleeding: yes

## 2014-01-02 NOTE — Progress Notes (Signed)
Pt signed informed consent for surgery today. Pt did not want to sign a blood consent at this time and stated that he wanted more time to think about that decision. Pt wiped down with CHG wipes in preparation for surgery.   Othella Boyer [facility] 01/02/2014 3:25 PM

## 2014-01-03 LAB — URINE CULTURE: Colony Count: >=100000

## 2014-01-03 MED ORDER — PHENAZOPYRIDINE HCL 95 MG PO TABS: 95.0000 mg | ORAL_TABLET | Freq: Three times a day (TID) | ORAL | Status: DC | PRN

## 2014-01-03 MED ORDER — SENNOSIDES-DOCUSATE SODIUM 8.6-50 MG PO TABS: 1.0000 | ORAL_TABLET | Freq: Two times a day (BID) | ORAL | Status: DC

## 2014-01-03 MED ORDER — DOXYCYCLINE HYCLATE 100 MG PO CAPS: 100.0000 mg | ORAL_CAPSULE | Freq: Two times a day (BID) | ORAL | Status: DC

## 2014-01-03 MED ORDER — OXYCODONE-ACETAMINOPHEN 5-325 MG PO TABS: 1.0000 | ORAL_TABLET | Freq: Four times a day (QID) | ORAL | Status: DC | PRN

## 2014-01-03 NOTE — Op Note (Signed)
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  PROCEDURES: 1. Transurethral resection of bladder tumour, volume large. 2. Bilateral retrograde pyelograms and interpretation.  ESTIMATED BLOOD LOSS:  Nil.  COMPLICATIONS:  None.  SPECIMEN: 1. Bladder dome mass. 2. Bladder erythema. 3. Right edge of bladder. 4. Left edge of bladder. 5. Anterior edge of bladder. 6. Posterior edge of bladder all for permanent pathology.  FINDINGS: 1. Very sessile-appearing bladder dome mass worrisome for possible     urothelial versus urachal carcinoma. 2. Mild amount of erythema around the edges of the mass.  Edge biopsy     was taken of what appeared to be normal edge mucosa to rule out     concomitant carcinoma in situ. 3. Small area of bladder erythema most consistent with catheter     irritation, biopsy obtained to rule out carcinoma in situ. 4. Unremarkable bilateral retrograde pyelograms.  ESTIMATED BLOOD LOSS:  Nil.  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  He was initially diagnosed in stage in September 2014, and counseled towards partial cystectomy is a clinically localized disease.  He subsequently failed to follow up due to the poor weather, he states.  He represented over the weekend with clot urinary retention, and CT imaging revealing likely regrowth of his bladder dome tumor; however, no obvious distant disease.  His urine has cleared somewhat with Foley catheter; however, this still remains a stone hematuria off and on.  Options were discussed including  restaging transurethral resection for goal on verifying if he still candidate for partial cystectomy and reducing hematuria and interval between now and when he undergoes definitive therapy.  He wished to proceed.  Informed consent was obtained and placed in medical record.  PROCEDURE IN DETAIL:  The patient being Dustin Jordan, was verified. Procedure being transurethral resection of bladder tumor was confirmed. Procedure was carried out.  Time-out was performed.  Intravenous antibiotics were administered.  General LMA anesthesia was induced.  The patient was placed into a low lithotomy position.  Sterile field was created by prepping and draping the patient's penis, perineum, and proximal thighs using iodine x3.  Next, cystourethroscopy was performed using a 22-French rigid scope with 12-degree offset lens.  Inspection of the anterior and posterior urethra unremarkable.  Inspection of the urinary bladder revealed significant amount of debris.  There was still small of clot, which was irrigated via the cystoscope.  Bilateral ureteral orifices were in the normal anatomic position.  There was a very large sessile-appearing bladder dome tumor worrisome for possible dome urothelial versus urachal carcinoma.  There was some erythema at the edges of this and there was also some erythema on the posterior and lateral aspects of the bladder most consistent with the catheter trauma. Attention was then  directed to bilateral retrograde pyelograms.  First, the ureteral orifice was cannulated with 6-French end-hole catheter and left retrograde pyelogram obtained.  Left retrograde pyelogram demonstrated a single left ureter, single system left kidney.  No filling defects or narrowing noted.  Similarly, right retrograde pyelogram was obtained.  Right retrograde pyelogram demonstrated single right ureter, single system right kidney.  No filling defects or narrowing noted.  There was prompt efflux on  delayed films.  Attention was then directed at bladder biopsies.  Cold cup biopsies were taken of the anterior-posterior right and left bladder approximately 1 cm away from the bladder mass, just to verify no carcinoma in situ, which would prevent proceeding with partial cystectomy.  These were purposely not full thickness seromuscular bites more of the urothelium itself to avoid bladder dome perforation.  These were set aside separately for permanent pathology.  The base of these biopsy sites was then coagulated with coagulation current via the resectoscope loop.  Additional cold cup biopsy forceps was taken of the bladder erythema and again, the base of this was coagulated with coagulation loop current.  Following these maneuvers, hemostasis appeared excellent from the biopsy sites.  Next, fulguration was performed of the distance approximately 2 cm circumferentially around the edges of the bladder dome tumor as there were some erythema in the sites to prevent ongoing hematuria.  Next, partial resection was performed at the bladder dome mass more for goal of that tissue diagnosis, resecting down approximately 1 cm into the mass and keeping it not quite flush with the bladder dome.  The suture fragments were set aside, labeled at the bladder dome tumor.  Base of this was control with fulguration coagulation current.  Following these maneuvers, hemostasis appeared excellent.  There, of coarse, was residual bladder dome tumor as expected.  There was no evidence of bladder perforation.  So, the continuous bladder irrigation was not warranted.  As such, the resectoscope was exchanged for new 22-French 3-way Foley catheter.  The irrigation port plugged, 20 mL of sterile water placed in the balloon. Procedure was then terminated.  The patient tolerated the procedure well.  There were no immediate periprocedural complications.  The patient was taken to the postanesthesia care unit in stable  condition.          ______________________________ Alexis Frock, MD     TM/MEDQ  D:  01/02/2014  T:  01/03/2014  Job:  [id number]

## 2014-01-03 NOTE — OR Nursing (Signed)
Deleted duplicate specimen per D Reymolds

## 2014-01-03 NOTE — Discharge Summary (Signed)
Physician Discharge Summary  Patient ID: Dustin Jordan MRN: [id number] DOB/AGE: 1946/04/30 68 y.o.  Admit date: 01/01/2014 Discharge date: 01/03/2014  Admission Diagnoses: Bladder Cancer, clot retention  Discharge Diagnoses:  Principal Problem:   Clot retention of urine Goiter  Discharged Condition: good  Hospital Course:   1 - Bladder Cancer - Pt with papillary bladder neoplasm by office cysto found 2014on work-up of gross hematuria. TURBT 05/2013 with at least T1G3 at dome and planned for partial cystectomy. Did not follow up and represted 01/01/2014 with clot retention and CT Urogram w/o distant disease but recurrence of large dome tumor and sig perivesical component worrisome for possibel T3 disease and admitted through ER with large foley catheter placed.  On 4/6 he underwent restaging transurethral resection of bladder tumor and bilateral retrograde pyelograms. Also had neck CT for tracheal deviation which revealed likely benign goiter, no neck adenopathy. Path pending at discharge, but likely still with dome-only tumor. By 4/7, the day of discharge, pt was voiding well w/o clots, tolerating PO diet, afebrile, and felt to be adequate for discharge.    Consults: None  Significant Diagnostic Studies: labs: surgical pathology - pending  Treatments: surgery: 01/02/2014: restaging transurethral resection of bladder tumor and bilateral retrograde pyelograms  Discharge Exam: Blood pressure 142/79, pulse 73, temperature 98.7 F (37.1 C), temperature source Oral, resp. rate 18, height 6\' 3"  (1.905 m), weight 83.553 kg (184 lb 3.2 oz), SpO2 100.00%. General appearance: alert, cooperative and appears stated age Head: Normocephalic, without obvious abnormality, atraumatic Neck: no adenopathy Back: symmetric, no curvature. ROM normal. No CVA tenderness. Resp: non-labored Cardio: regular rate and rhythm, S1, S2 normal, no murmur, click, rub or gallop GI: soft, non-tender; bowel sounds normal;  no masses,  no organomegaly Male genitalia: normal Extremities: extremities normal, atraumatic, no cyanosis or edema Pulses: 2+ and symmetric Lymph nodes: Cervical, supraclavicular, and axillary nodes normal. Neurologic: Grossly normal Incision/Wound: none  Disposition: 01-Home or Self Care     Medication List         BC HEADACHE POWDER PO  Take 1 packet by mouth every 4 (four) hours as needed (headache).     doxycycline 100 MG capsule  Commonly known as:  VIBRAMYCIN  Take 1 capsule (100 mg total) by mouth 2 (two) times daily. X 3 days to prevent urinary infection     hydrochlorothiazide 12.5 MG capsule  Commonly known as:  MICROZIDE  Take 1 capsule (12.5 mg total) by mouth daily.     oxyCODONE-acetaminophen 5-325 MG per tablet  Commonly known as:  ROXICET  Take 1 tablet by mouth every 6 (six) hours as needed for moderate pain or severe pain. Post-operatively     phenazopyridine 95 MG tablet  Commonly known as:  PYRIDIUM  Take 1 tablet (95 mg total) by mouth 3 (three) times daily as needed for pain. ( urinary burning )     senna-docusate 8.6-50 MG per tablet  Commonly known as:  Senokot-S  Take 1 tablet by mouth 2 (two) times daily. While taking pain meds to prevent constipation           Follow-up Information   Follow up with Alexis Frock, MD. (We will call you with appointment in about 1-2 weeks to discuss furhter cancer surgery.)    Specialty:  Urology   Contact information:   [facility]  [place] [address] [phone]       Signed: Alexis Frock 01/03/2014, 5:34 PM

## 2014-01-03 NOTE — Progress Notes (Signed)
RN retrieved the patient's valuables from the security office.  The patient now has his valuables in his room. Patient is contented to have his belongings back in his possession.

## 2014-01-03 NOTE — Discharge Instructions (Signed)
1 - You may have urinary urgency (bladder spasms) and bloody urine on / off. This is normal.  2 - Call MD or go to ER for fever >102, severe pain / nausea / vomiting not relieved by medications, or acute change in medical status  

## 2014-01-03 NOTE — Progress Notes (Signed)
F/c removed at about 9am. Pt states he has voided about 12 times which measured about 863ml total since f/c removed. Pt also states he has felt frequent urges which has improved after receiving hyoscyamine.

## 2014-01-04 ENCOUNTER — Encounter (HOSPITAL_COMMUNITY): Payer: Self-pay | Admitting: Urology

## 2014-01-17 ENCOUNTER — Other Ambulatory Visit: Payer: Self-pay | Admitting: Urology

## 2014-01-19 ENCOUNTER — Ambulatory Visit (INDEPENDENT_AMBULATORY_CARE_PROVIDER_SITE_OTHER): Payer: Medicare Other | Admitting: Family Medicine

## 2014-01-19 VITALS — BP 110/72 | HR 71 | Temp 97.5°F | Resp 16 | Ht 71.0 in | Wt 188.2 lb

## 2014-01-19 DIAGNOSIS — K625 Hemorrhage of anus and rectum: Secondary | ICD-10-CM

## 2014-01-19 DIAGNOSIS — I10 Essential (primary) hypertension: Secondary | ICD-10-CM

## 2014-01-19 DIAGNOSIS — E049 Nontoxic goiter, unspecified: Secondary | ICD-10-CM

## 2014-01-19 DIAGNOSIS — N323 Diverticulum of bladder: Secondary | ICD-10-CM

## 2014-01-19 LAB — COMPREHENSIVE METABOLIC PANEL
ALBUMIN: 3.8 g/dL (ref 3.5–5.2)
ALT: 22 U/L (ref 0–53)
AST: 23 U/L (ref 0–37)
Alkaline Phosphatase: 70 U/L (ref 39–117)
BUN: 23 mg/dL (ref 6–23)
CALCIUM: 9.4 mg/dL (ref 8.4–10.5)
CHLORIDE: 106 meq/L (ref 96–112)
CO2: 26 meq/L (ref 19–32)
CREATININE: 1.09 mg/dL (ref 0.50–1.35)
Glucose, Bld: 76 mg/dL (ref 70–99)
Potassium: 4.2 mEq/L (ref 3.5–5.3)
Sodium: 139 mEq/L (ref 135–145)
Total Bilirubin: 0.4 mg/dL (ref 0.2–1.2)
Total Protein: 7.4 g/dL (ref 6.0–8.3)

## 2014-01-19 LAB — POCT CBC
Granulocyte percent: 58 %G (ref 37–80)
HEMATOCRIT: 35.9 % — AB (ref 43.5–53.7)
Hemoglobin: 11.1 g/dL — AB (ref 14.1–18.1)
LYMPH, POC: 2.8 (ref 0.6–3.4)
MCH: 27.3 pg (ref 27–31.2)
MCHC: 30.9 g/dL — AB (ref 31.8–35.4)
MCV: 88.2 fL (ref 80–97)
MID (cbc): 0.6 (ref 0–0.9)
MPV: 9.3 fL (ref 0–99.8)
POC Granulocyte: 4.6 (ref 2–6.9)
POC LYMPH PERCENT: 34.9 %L (ref 10–50)
POC MID %: 7.1 %M (ref 0–12)
Platelet Count, POC: 313 10*3/uL (ref 142–424)
RBC: 4.07 M/uL — AB (ref 4.69–6.13)
RDW, POC: 14.3 %
WBC: 8 10*3/uL (ref 4.6–10.2)

## 2014-01-19 LAB — TSH: TSH: 1.453 u[IU]/mL (ref 0.350–4.500)

## 2014-01-19 LAB — IFOBT (OCCULT BLOOD): IFOBT: POSITIVE

## 2014-01-19 MED ORDER — HYDROCHLOROTHIAZIDE 12.5 MG PO CAPS: 12.5000 mg | ORAL_CAPSULE | Freq: Every day | ORAL | Status: DC

## 2014-01-19 NOTE — Patient Instructions (Addendum)
You have an appointment with [facility] tomorrow at 3:30 pm  [address], [place], [place] [address]  [phone]   If you start to feel weak or dizzy, or have any severe bleeding over night go to the ER  I have refilled your blood pressure medication.  However right now your blood pressure is on the low side so I would advise you not to take it for the next few days if you are able to check your BP.  If you start running over 130/90 restart your medication Be sure to drink plenty of water.    Avoid taking any NSAID medications such as ibuprofen, aleve, BC powders.  Also avoid aspirin Use tylenol as needed for aches and pains in the meantime.

## 2014-01-19 NOTE — Progress Notes (Signed)
[facility] [address] [place] [address] [phone]  Date:  01/19/2014   Name:  Dustin Jordan   DOB:  August 16, 1946   MRN:  [id number]  PCP:  No primary provider on file.    Chief Complaint: Rectal Bleeding   History of Present Illness:  Dustin Jordan is a 68 y.o. very pleasant male patient who presents with the following:  He was diagnosed with bladder cancer last fall- he had a transurethral resection of tumor in September 2014.   He then was readmitted from 4/5 to 01/03/14 with clot retention.  There was also concern for recurrent cancer on a CT, and he underwent restaging transurethral resection of bladder tumor on 4/6. I am not sure of the final pathology results.    He is here today with blood in his stool.  He has noted it over the past week or so. He will see bright red blood in the toilet bowel.  He does not have any pain with BM.  He does not feel that he has been constipated.   Denies any straining  He had told me that his colonosocpy was UTD at his physical last year, but on 2nd thought he thinks it may have been about15 years.  He saw Dr. Sarina Ser and was pleased with him.    He is urinating ok at this time Eating well, no abdominal pain.  Overall he feels well Does admit to using some NSAIDs and BC powders once or twice a week  He also had a CT of his neck while inpt due to a goiter- brings a copy of this for me to review today  Patient Active Problem List   Diagnosis Date Noted  . Clot retention of urine 01/01/2014  . Bladder cancer 07/26/2013  . HTN (hypertension) 04/20/2013  . Hematuria 04/20/2013    Past Medical History  Diagnosis Date  . Hypertension   . White coat hypertension   . Swelling of right knee joint     s/p arthroscopy 06-08-2013  . Bladder cancer   . Hematuria   . Frequency of urination     Past Surgical History  Procedure Laterality Date  . Knee arthroscopy Right 06-08-2013  . Cystoscopy w/ retrogrades  Bilateral 06/22/2013    Procedure: CYSTOSCOPY WITH RETROGRADE PYELOGRAM;  Surgeon: Alexis Frock, MD;  Location: [facility];  Service: Urology;  Laterality: Bilateral;  . Transurethral resection of bladder tumor with gyrus (turbt-gyrus) N/A 06/22/2013    Procedure: TRANSURETHRAL RESECTION OF BLADDER TUMOR WITH GYRUS (TURBT-GYRUS);  Surgeon: Alexis Frock, MD;  Location[facility];  Service: Urology;  Laterality: N/A;  . Cystoscopy with retrograde pyelogram, ureteroscopy and stent placement Bilateral 01/02/2014    Procedure: CYSTOSCOPY WITH RETROGRADE PYELOGRAM BILATERAL URETERS  AND TRANSURETHRAL RESECTION OF A BLADDER TUMOR WITH MULTIPLE BX;  Surgeon: Alexis Frock, MD;  Location: [facility];  Service: Urology;  Laterality: Bilateral;    History  Substance Use Topics  . Smoking status: Former Smoker -- 1.00 packs/day for 20 years    Types: Cigarettes    Quit date: 06/15/2010  . Smokeless tobacco: Never Used  . Alcohol Use: No    No family history on file.  No Known Allergies  Medication list has been reviewed and updated.  Current Outpatient Prescriptions on File Prior to Visit  Medication Sig Dispense Refill  . Aspirin-Salicylamide-Caffeine (BC HEADACHE POWDER PO) Take 1 packet by mouth every 4 (four) hours as needed (  headache).      . hydrochlorothiazide (MICROZIDE) 12.5 MG capsule Take 1 capsule (12.5 mg total) by mouth daily.  90 capsule  3   No current facility-administered medications on file prior to visit.    Review of Systems:  As per HPI- otherwise negative.   Physical Examination: Filed Vitals:   01/19/14 1508  BP: 108/62  Pulse: 71  Temp: 97.5 F (36.4 C)  Resp: 16   Filed Vitals:   01/19/14 1508  Height: 5\' 11"  (1.803 m)  Weight: 188 lb 3.2 oz (85.367 kg)   Body mass index is 26.26 kg/(m^2). Ideal Body Weight: Weight in (lb) to have BMI = 25: 178.9  GEN: WDWN, NAD, Non-toxic, A & O x 3, looks well HEENT: Atraumatic,  Normocephalic. Neck supple. No masses, No LAD.  Small goiter Ears and Nose: No external deformity. CV: RRR, No M/G/R. No JVD. No thrill. No extra heart sounds. PULM: CTA B, no wheezes, crackles, rhonchi. No retractions. No resp. distress. No accessory muscle use. ABD: S, NT, ND, +BS. No rebound. No HSM.  Benign exam EXTR: No c/c/e NEURO Normal gait.  PSYCH: Normally interactive. Conversant. Not depressed or anxious appearing.  Calm demeanor.  Rectal: there is dark, slightly clotted blood on finger. No tenderness or apparent fissure/ external hemorrhoid.  No gross active bleeding   Results for orders placed in visit on 01/19/14  POCT CBC      Result Value Ref Range   WBC 8.0  4.6 - 10.2 K/uL   Lymph, poc 2.8  0.6 - 3.4   POC LYMPH PERCENT 34.9  10 - 50 %L   MID (cbc) 0.6  0 - 0.9   POC MID % 7.1  0 - 12 %M   POC Granulocyte 4.6  2 - 6.9   Granulocyte percent 58.0  37 - 80 %G   RBC 4.07 (*) 4.69 - 6.13 M/uL   Hemoglobin 11.1 (*) 14.1 - 18.1 g/dL   HCT, POC 35.9 (*) 43.5 - 53.7 %   MCV 88.2  80 - 97 fL   MCH, POC 27.3  27 - 31.2 pg   MCHC 30.9 (*) 31.8 - 35.4 g/dL   RDW, POC 14.3     Platelet Count, POC 313  142 - 424 K/uL   MPV 9.3  0 - 99.8 fL  IFOBT (OCCULT BLOOD)      Result Value Ref Range   IFOBT Positive     hemoglobin was 11.9 earlier this month after surgical procedure, and 14 prior to surgery.    Assessment and Plan: Goiter - Plan: TSH  Rectal bleeding - Plan: POCT CBC, Comprehensive metabolic panel, IFOBT POC (occult bld, rslt in office)  HTN (hypertension) - Plan: hydrochlorothiazide (MICROZIDE) 12.5 MG capsule  Rectal bleeding.  Discussed with Dr. Collene Mares who very kindly agreed to see him tomorrow.  He has an outstanding balance with [facility] it seems; he plans to call them tomorrow and try to rectify this situation but I do not want this to wait.  Check TSH and plan likely referral to endocrine to further evaluate his goiter.   Hold BP medication for now- he will  follow his BP at home and restart if he gets over 130/90  Signed Lamar Blinks, MD

## 2014-01-20 NOTE — Addendum Note (Signed)
Addended by: Darreld Mclean on: 01/20/2014 06:47 AM   Modules accepted: Orders

## 2014-01-27 ENCOUNTER — Encounter: Payer: Self-pay | Admitting: *Deleted

## 2014-02-10 ENCOUNTER — Encounter (HOSPITAL_COMMUNITY): Payer: Self-pay | Admitting: Pharmacy Technician

## 2014-02-10 NOTE — Patient Instructions (Addendum)
Your procedure is scheduled on:  02/17/14  FRIDAY  Report to [place] at   0930    AM.  Call this number if you have problems the morning of surgery: [place]   Do not eat take anything by mouth :After Midnight.THURSDAY NIGHT   Take these medicines the morning of surgery with A SIP OF WATER:NONE   .  Contacts, dentures or partial plates, or metal hairpins  can not be worn to surgery. Your family will be responsible for glasses, dentures, hearing aides while you are in surgery  Leave suitcase in the car. After surgery it may be brought to your room.  For patients admitted to the hospital, checkout time is 11:00 AM day of  discharge.                DO NOT WEAR JEWELRY, LOTIONS, POWDERS, OR PERFUMES.  WOMEN-- DO NOT SHAVE LEGS OR UNDERARMS FOR 48 HOURS BEFORE SHOWERS. MEN MAY SHAVE FACE.  Patients discharged the day of surgery will not be allowed to drive home. IF going home the day of surgery, you must have a driver and someone to stay with you for the first 24 hours                                                                                                                        [facility] - Preparing for Surgery Before surgery, you can play an important role.  Because skin is not sterile, your skin needs to be as free of germs as possible.  You can reduce the number of germs on your skin by washing with CHG (chlorahexidine gluconate) soap before surgery.  CHG is an antiseptic cleaner which kills germs and bonds with the skin to continue killing germs even after washing. Please DO NOT use if you have an allergy to CHG or antibacterial soaps.  If your skin becomes reddened/irritated stop using the CHG and inform your nurse when you arrive at Short Stay. Do not shave (including legs and underarms) for at least 48 hours prior to the first CHG shower.  You may shave your face. Please follow these instructions  carefully:  1.  Shower with CHG Soap the night before surgery and the  morning of Surgery.  2.  If you choose to wash your hair, wash your hair first as usual with your  normal  shampoo.  3.  After you shampoo, rinse your hair and body thoroughly to remove the  shampoo.                           4.  Use CHG as you would any other liquid soap.  You can apply chg directly  to the skin and wash  Gently with a scrungie or clean washcloth.  5.  Apply the CHG Soap to your body ONLY FROM THE NECK DOWN.   Do not use on open                           Wound or open sores. Avoid contact with eyes, ears mouth and genitals (private parts).                        Genitals (private parts) with your normal soap.             6.  Wash thoroughly, paying special attention to the area where your surgery  will be performed.  7.  Thoroughly rinse your body with warm water from the neck down.  8.  DO NOT shower/wash with your normal soap after using and rinsing off  the CHG Soap.                9.  Pat yourself dry with a clean towel.            10.  Wear clean pajamas.            11.  Place clean sheets on your bed the night of your first shower and do not  sleep with pets. Day of Surgery : Do not apply any lotions/deodorants the morning of surgery.  Please wear clean clothes to the hospital/surgery center.  FAILURE TO FOLLOW THESE INSTRUCTIONS MAY RESULT IN THE CANCELLATION OF YOUR SURGERY PATIENT SIGNATURE_________________________________  NURSE SIGNATURE__________________________________  ________________________________________________________________________  WHAT IS A BLOOD TRANSFUSION? Blood Transfusion Information  A transfusion is the replacement of blood or some of its parts. Blood is made up of multiple cells which provide different functions.  Red blood cells carry oxygen and are used for blood loss replacement.  White blood cells fight against infection.  Platelets control  bleeding.  Plasma helps clot blood.  Other blood products are available for specialized needs, such as hemophilia or other clotting disorders. BEFORE THE TRANSFUSION  Who gives blood for transfusions?   Healthy volunteers who are fully evaluated to make sure their blood is safe. This is blood bank blood. Transfusion therapy is the safest it has ever been in the practice of medicine. Before blood is taken from a donor, a complete history is taken to make sure that person has no history of diseases nor engages in risky social behavior (examples are intravenous drug use or sexual activity with multiple partners). The donor's travel history is screened to minimize risk of transmitting infections, such as malaria. The donated blood is tested for signs of infectious diseases, such as HIV and hepatitis. The blood is then tested to be sure it is compatible with you in order to minimize the chance of a transfusion reaction. If you or a relative donates blood, this is often done in anticipation of surgery and is not appropriate for emergency situations. It takes many days to process the donated blood. RISKS AND COMPLICATIONS Although transfusion therapy is very safe and saves many lives, the main dangers of transfusion include:   Getting an infectious disease.  Developing a transfusion reaction. This is an allergic reaction to something in the blood you were given. Every precaution is taken to prevent this. The decision to have a blood transfusion has been considered carefully by your caregiver before blood is given. Blood is not given unless the benefits outweigh the risks. AFTER  THE TRANSFUSION  Right after receiving a blood transfusion, you will usually feel much better and more energetic. This is especially true if your red blood cells have gotten low (anemic). The transfusion raises the level of the red blood cells which carry oxygen, and this usually causes an energy increase.  The nurse  administering the transfusion will monitor you carefully for complications. HOME CARE INSTRUCTIONS  No special instructions are needed after a transfusion. You may find your energy is better. Speak with your caregiver about any limitations on activity for underlying diseases you may have. SEEK MEDICAL CARE IF:   Your condition is not improving after your transfusion.  You develop redness or irritation at the intravenous (IV) site. SEEK IMMEDIATE MEDICAL CARE IF:  Any of the following symptoms occur over the next 12 hours:  Shaking chills.  You have a temperature by mouth above 102 F (38.9 C), not controlled by medicine.  Chest, back, or muscle pain.  People around you feel you are not acting correctly or are confused.  Shortness of breath or difficulty breathing.  Dizziness and fainting.  You get a rash or develop hives.  You have a decrease in urine output.  Your urine turns a dark color or changes to pink, red, or brown. Any of the following symptoms occur over the next 10 days:  You have a temperature by mouth above 102 F (38.9 C), not controlled by medicine.  Shortness of breath.  Weakness after normal activity.  The white part of the eye turns yellow (jaundice).  You have a decrease in the amount of urine or are urinating less often.  Your urine turns a dark color or changes to pink, red, or brown. Document Released: 09/12/2000 Document Revised: 12/08/2011 Document Reviewed: 05/01/2008 [facility] Patient Information 2014 [place], [place].  _______________________________________________________________________

## 2014-02-10 NOTE — Progress Notes (Signed)
Chest x ray, CT neck, ekg 01/01/14  epic

## 2014-02-13 ENCOUNTER — Encounter (HOSPITAL_COMMUNITY): Payer: Self-pay

## 2014-02-13 ENCOUNTER — Encounter (HOSPITAL_COMMUNITY)
Admission: RE | Admit: 2014-02-13 | Discharge: 2014-02-13 | Disposition: A | Discharge status: Home / Self Care | Payer: Medicare Other | Source: Ambulatory Visit | Attending: Urology | Admitting: Urology

## 2014-02-13 DIAGNOSIS — Z01818 Encounter for other preprocedural examination: Secondary | ICD-10-CM | POA: Insufficient documentation

## 2014-02-13 NOTE — Progress Notes (Signed)
Cbc, cmet 01/19/14  epic

## 2014-02-16 NOTE — Progress Notes (Signed)
Notified of time change and to arrive at short stay 1030am.   Asked when does he start clear liquids?  States ate oatmeal and ?chicken for breakfast.  Reinstructed him to begin clear liquids now, drink fluids all day until midnight or bedtime tonight- then nothing by mouth.  Left voice mail with Foster Simpson at [facility] regarding the breakfast eaten today

## 2014-02-17 ENCOUNTER — Inpatient Hospital Stay (HOSPITAL_COMMUNITY)
Admission: RE | Admit: 2014-02-17 | Discharge: 2014-02-22 | DRG: 655 | Disposition: A | Discharge status: Home / Self Care | Payer: Medicare Other | Source: Ambulatory Visit | Attending: Urology | Admitting: Urology

## 2014-02-17 ENCOUNTER — Encounter (HOSPITAL_COMMUNITY): Payer: Medicare Other | Admitting: Anesthesiology

## 2014-02-17 ENCOUNTER — Encounter (HOSPITAL_COMMUNITY): Payer: Self-pay

## 2014-02-17 ENCOUNTER — Encounter (HOSPITAL_COMMUNITY)
Admission: RE | Disposition: A | Discharge status: Home / Self Care | Payer: Self-pay | Source: Ambulatory Visit | Attending: Urology

## 2014-02-17 ENCOUNTER — Inpatient Hospital Stay (HOSPITAL_COMMUNITY): Payer: Medicare Other | Admitting: Anesthesiology

## 2014-02-17 DIAGNOSIS — Z01812 Encounter for preprocedural laboratory examination: Secondary | ICD-10-CM

## 2014-02-17 DIAGNOSIS — N9989 Other postprocedural complications and disorders of genitourinary system: Secondary | ICD-10-CM | POA: Diagnosis not present

## 2014-02-17 DIAGNOSIS — C671 Malignant neoplasm of dome of bladder: Principal | ICD-10-CM | POA: Diagnosis present

## 2014-02-17 DIAGNOSIS — I10 Essential (primary) hypertension: Secondary | ICD-10-CM | POA: Diagnosis present

## 2014-02-17 DIAGNOSIS — Z87891 Personal history of nicotine dependence: Secondary | ICD-10-CM

## 2014-02-17 DIAGNOSIS — Y836 Removal of other organ (partial) (total) as the cause of abnormal reaction of the patient, or of later complication, without mention of misadventure at the time of the procedure: Secondary | ICD-10-CM | POA: Diagnosis not present

## 2014-02-17 DIAGNOSIS — IMO0002 Reserved for concepts with insufficient information to code with codable children: Secondary | ICD-10-CM | POA: Diagnosis not present

## 2014-02-17 HISTORY — PX: ROBOT ASSISTED LAPAROSCOPIC COMPLETE CYSTECT ILEAL CONDUIT: SHX5139

## 2014-02-17 HISTORY — PX: LYMPHADENECTOMY: SHX5960

## 2014-02-17 HISTORY — PX: CYSTOSCOPY: SHX5120

## 2014-02-17 LAB — TYPE AND SCREEN
ABO/RH(D): O POS
Antibody Screen: NEGATIVE

## 2014-02-17 LAB — HEMOGLOBIN AND HEMATOCRIT, BLOOD
HEMATOCRIT: 40.7 % (ref 39.0–52.0)
Hemoglobin: 12.7 g/dL — ABNORMAL LOW (ref 13.0–17.0)

## 2014-02-17 LAB — ABO/RH: ABO/RH(D): O POS

## 2014-02-17 SURGERY — ROBOTIC ASSISTED LAPAROSCOPIC COMPLETE CYSTECT ILEAL CONDUIT
Anesthesia: General

## 2014-02-17 MED ORDER — MIDAZOLAM HCL 2 MG/2ML IJ SOLN
INTRAMUSCULAR | Status: AC
  Filled 2014-02-17: qty 2

## 2014-02-17 MED ORDER — LIDOCAINE HCL (CARDIAC) 20 MG/ML IV SOLN
INTRAVENOUS | Status: DC | PRN
  Administered 2014-02-17: 100 mg via INTRAVENOUS

## 2014-02-17 MED ORDER — BUPIVACAINE LIPOSOME 1.3 % IJ SUSP
20.0000 mL | Freq: Once | INTRAMUSCULAR | Status: AC
  Administered 2014-02-17: 17 mL
  Filled 2014-02-17: qty 20

## 2014-02-17 MED ORDER — HYDROMORPHONE HCL PF 1 MG/ML IJ SOLN
0.2500 mg | INTRAMUSCULAR | Status: DC | PRN
  Administered 2014-02-17: 0.5 mg via INTRAVENOUS

## 2014-02-17 MED ORDER — ROCURONIUM BROMIDE 100 MG/10ML IV SOLN
INTRAVENOUS | Status: DC | PRN
  Administered 2014-02-17: 60 mg via INTRAVENOUS
  Administered 2014-02-17: 20 mg via INTRAVENOUS

## 2014-02-17 MED ORDER — GLYCOPYRROLATE 0.2 MG/ML IJ SOLN
INTRAMUSCULAR | Status: DC | PRN
  Administered 2014-02-17: .8 mg via INTRAVENOUS

## 2014-02-17 MED ORDER — ROCURONIUM BROMIDE 100 MG/10ML IV SOLN
INTRAVENOUS | Status: AC
  Filled 2014-02-17: qty 1

## 2014-02-17 MED ORDER — GENTAMICIN SULFATE 40 MG/ML IJ SOLN
420.0000 mg | INTRAVENOUS | Status: AC
  Administered 2014-02-17: 420 mg via INTRAVENOUS
  Filled 2014-02-17 (×2): qty 10.5

## 2014-02-17 MED ORDER — HYDRALAZINE HCL 20 MG/ML IJ SOLN
INTRAMUSCULAR | Status: DC | PRN
  Administered 2014-02-17 (×3): 4 mg via INTRAVENOUS

## 2014-02-17 MED ORDER — FENTANYL CITRATE 0.05 MG/ML IJ SOLN
INTRAMUSCULAR | Status: AC
  Filled 2014-02-17: qty 5

## 2014-02-17 MED ORDER — LACTATED RINGERS IV SOLN
INTRAVENOUS | Status: DC
  Administered 2014-02-17: 1000 mL via INTRAVENOUS

## 2014-02-17 MED ORDER — HEPARIN SODIUM (PORCINE) 1000 UNIT/ML IJ SOLN
INTRAMUSCULAR | Status: AC
Start: 2014-02-17 — End: 2014-02-17
  Filled 2014-02-17: qty 1

## 2014-02-17 MED ORDER — FENTANYL CITRATE 0.05 MG/ML IJ SOLN
INTRAMUSCULAR | Status: DC | PRN
  Administered 2014-02-17 (×2): 100 ug via INTRAVENOUS
  Administered 2014-02-17: 50 ug via INTRAVENOUS

## 2014-02-17 MED ORDER — SENNA 8.6 MG PO TABS
1.0000 | ORAL_TABLET | Freq: Two times a day (BID) | ORAL | Status: DC
  Administered 2014-02-17 – 2014-02-21 (×9): 8.6 mg via ORAL
  Filled 2014-02-17 (×9): qty 1

## 2014-02-17 MED ORDER — HYDROMORPHONE HCL PF 1 MG/ML IJ SOLN
INTRAMUSCULAR | Status: AC
  Filled 2014-02-17: qty 1

## 2014-02-17 MED ORDER — KCL IN DEXTROSE-NACL 20-5-0.45 MEQ/L-%-% IV SOLN
INTRAVENOUS | Status: AC
  Filled 2014-02-17: qty 1000

## 2014-02-17 MED ORDER — NEOSTIGMINE METHYLSULFATE 10 MG/10ML IV SOLN
INTRAVENOUS | Status: AC
  Filled 2014-02-17: qty 1

## 2014-02-17 MED ORDER — INDOCYANINE GREEN 25 MG IV SOLR
10.0000 mg | INTRAVENOUS | Status: DC | PRN
  Administered 2014-02-17: 10 mg via INTRAVENOUS

## 2014-02-17 MED ORDER — LABETALOL HCL 5 MG/ML IV SOLN
INTRAVENOUS | Status: AC
  Filled 2014-02-17: qty 4

## 2014-02-17 MED ORDER — LABETALOL HCL 5 MG/ML IV SOLN
5.0000 mg | INTRAVENOUS | Status: DC | PRN
  Administered 2014-02-17: 5 mg via INTRAVENOUS

## 2014-02-17 MED ORDER — HYDROMORPHONE HCL PF 1 MG/ML IJ SOLN
INTRAMUSCULAR | Status: DC | PRN
  Administered 2014-02-17: 0.5 mg via INTRAVENOUS
  Administered 2014-02-17: 1 mg via INTRAVENOUS
  Administered 2014-02-17: 0.5 mg via INTRAVENOUS

## 2014-02-17 MED ORDER — HYDROMORPHONE HCL PF 2 MG/ML IJ SOLN
INTRAMUSCULAR | Status: AC
  Filled 2014-02-17: qty 1

## 2014-02-17 MED ORDER — ONDANSETRON HCL 4 MG/2ML IJ SOLN
INTRAMUSCULAR | Status: DC | PRN
  Administered 2014-02-17: 4 mg via INTRAVENOUS

## 2014-02-17 MED ORDER — SODIUM CHLORIDE 0.9 % IJ SOLN
INTRAMUSCULAR | Status: AC
  Filled 2014-02-17: qty 3

## 2014-02-17 MED ORDER — PROPOFOL 10 MG/ML IV BOLUS
INTRAVENOUS | Status: AC
Start: 2014-02-17 — End: 2014-02-17
  Filled 2014-02-17: qty 20

## 2014-02-17 MED ORDER — PROPOFOL 10 MG/ML IV BOLUS
INTRAVENOUS | Status: DC | PRN
  Administered 2014-02-17: 200 mg via INTRAVENOUS

## 2014-02-17 MED ORDER — HYDROMORPHONE HCL PF 1 MG/ML IJ SOLN
0.5000 mg | INTRAMUSCULAR | Status: DC | PRN
  Administered 2014-02-18 – 2014-02-21 (×9): 1 mg via INTRAVENOUS
  Filled 2014-02-17 (×9): qty 1

## 2014-02-17 MED ORDER — LACTATED RINGERS IV SOLN
INTRAVENOUS | Status: DC | PRN
  Administered 2014-02-17 (×2): via INTRAVENOUS

## 2014-02-17 MED ORDER — NEOSTIGMINE METHYLSULFATE 10 MG/10ML IV SOLN
INTRAVENOUS | Status: DC | PRN
  Administered 2014-02-17: 5 mg via INTRAVENOUS

## 2014-02-17 MED ORDER — LACTATED RINGERS IV SOLN: INTRAVENOUS | Status: DC

## 2014-02-17 MED ORDER — CEFAZOLIN SODIUM-DEXTROSE 2-3 GM-% IV SOLR
INTRAVENOUS | Status: AC
Start: 2014-02-17 — End: 2014-02-17
  Filled 2014-02-17: qty 50

## 2014-02-17 MED ORDER — SODIUM CHLORIDE 0.9 % IV BOLUS (SEPSIS)
1000.0000 mL | Freq: Once | INTRAVENOUS | Status: AC
  Administered 2014-02-17: 1000 mL via INTRAVENOUS

## 2014-02-17 MED ORDER — MIDAZOLAM HCL 5 MG/5ML IJ SOLN
INTRAMUSCULAR | Status: DC | PRN
  Administered 2014-02-17: 2 mg via INTRAVENOUS

## 2014-02-17 MED ORDER — LACTATED RINGERS IV SOLN
INTRAVENOUS | Status: DC | PRN
  Administered 2014-02-17: 13:00:00 via INTRAVENOUS

## 2014-02-17 MED ORDER — HYDRALAZINE HCL 20 MG/ML IJ SOLN
INTRAMUSCULAR | Status: AC
  Filled 2014-02-17: qty 1

## 2014-02-17 MED ORDER — DOCUSATE SODIUM 100 MG PO CAPS
100.0000 mg | ORAL_CAPSULE | Freq: Two times a day (BID) | ORAL | Status: DC
  Administered 2014-02-17 – 2014-02-21 (×9): 100 mg via ORAL
  Filled 2014-02-17 (×11): qty 1

## 2014-02-17 MED ORDER — STERILE WATER FOR IRRIGATION IR SOLN
Status: DC | PRN
  Administered 2014-02-17: 300 mL via INTRAVESICAL

## 2014-02-17 MED ORDER — KCL IN DEXTROSE-NACL 20-5-0.45 MEQ/L-%-% IV SOLN
INTRAVENOUS | Status: DC
  Administered 2014-02-17: 18:00:00 via INTRAVENOUS
  Administered 2014-02-18: 1000 mL via INTRAVENOUS
  Administered 2014-02-18 – 2014-02-20 (×4): via INTRAVENOUS
  Filled 2014-02-17 (×8): qty 1000

## 2014-02-17 MED ORDER — HYDROCHLOROTHIAZIDE 12.5 MG PO CAPS
12.5000 mg | ORAL_CAPSULE | Freq: Every morning | ORAL | Status: DC
  Administered 2014-02-18 – 2014-02-21 (×4): 12.5 mg via ORAL
  Filled 2014-02-17 (×5): qty 1

## 2014-02-17 MED ORDER — GLYCOPYRROLATE 0.2 MG/ML IJ SOLN
INTRAMUSCULAR | Status: AC
  Filled 2014-02-17: qty 4

## 2014-02-17 MED ORDER — OXYCODONE HCL 5 MG PO TABS
5.0000 mg | ORAL_TABLET | ORAL | Status: DC | PRN
  Administered 2014-02-19 – 2014-02-22 (×8): 5 mg via ORAL
  Filled 2014-02-17 (×8): qty 1

## 2014-02-17 MED ORDER — ACETAMINOPHEN 500 MG PO TABS
1000.0000 mg | ORAL_TABLET | Freq: Three times a day (TID) | ORAL | Status: AC
  Administered 2014-02-17 – 2014-02-18 (×3): 1000 mg via ORAL
  Filled 2014-02-17 (×3): qty 2

## 2014-02-17 MED ORDER — SODIUM CHLORIDE 0.9 % IJ SOLN
INTRAMUSCULAR | Status: AC
  Filled 2014-02-17: qty 20

## 2014-02-17 MED ORDER — CEFAZOLIN SODIUM-DEXTROSE 2-3 GM-% IV SOLR
2.0000 g | INTRAVENOUS | Status: AC
Start: 2014-02-17 — End: 2014-02-17
  Administered 2014-02-17: 2 g via INTRAVENOUS

## 2014-02-17 MED ORDER — SODIUM CHLORIDE 0.9 % IJ SOLN
INTRAMUSCULAR | Status: AC
  Filled 2014-02-17: qty 10

## 2014-02-17 MED ORDER — ONDANSETRON HCL 4 MG/2ML IJ SOLN: 4.0000 mg | INTRAMUSCULAR | Status: DC | PRN

## 2014-02-17 MED ORDER — LIDOCAINE HCL (CARDIAC) 20 MG/ML IV SOLN
INTRAVENOUS | Status: AC
  Filled 2014-02-17: qty 5

## 2014-02-17 MED ORDER — ONDANSETRON HCL 4 MG/2ML IJ SOLN
INTRAMUSCULAR | Status: AC
  Filled 2014-02-17: qty 2

## 2014-02-17 SURGICAL SUPPLY — 95 items
ADH SKN CLS APL DERMABOND .7 (GAUZE/BANDAGES/DRESSINGS) ×6
APL ESCP 34 STRL LF DISP (HEMOSTASIS)
APPLICATOR SURGIFLO ENDO (HEMOSTASIS) IMPLANT
BAG SPEC RTRVL LRG 6X4 10 (ENDOMECHANICALS)
BAG URINE DRAINAGE (UROLOGICAL SUPPLIES) ×2 IMPLANT
BAG URO CATCHER STRL LF (DRAPE) ×4 IMPLANT
BLADE SURG SZ10 CARB STEEL (BLADE) IMPLANT
CABLE HIGH FREQUENCY MONO STRZ (ELECTRODE) ×4 IMPLANT
CANISTER SUCTION 2500CC (MISCELLANEOUS) ×2 IMPLANT
CATH FOLEY 2WAY 5CC 20FR (CATHETERS) ×2 IMPLANT
CATH FOLEY 2WAY SLVR 18FR 30CC (CATHETERS) ×2 IMPLANT
CATH ROBINSON RED A/P 16FR (CATHETERS) ×2 IMPLANT
CHLORAPREP W/TINT 26ML (MISCELLANEOUS) ×4 IMPLANT
CLIP LIGATING HEM O LOK PURPLE (MISCELLANEOUS) ×12 IMPLANT
CLIP LIGATING HEMO LOK XL GOLD (MISCELLANEOUS) ×4 IMPLANT
CLOTH BEACON ORANGE TIMEOUT ST (SAFETY) ×4 IMPLANT
COVER MAYO STAND STRL (DRAPES) ×4 IMPLANT
COVER SURGICAL LIGHT HANDLE (MISCELLANEOUS) ×4 IMPLANT
COVER TIP SHEARS 8 DVNC (MISCELLANEOUS) ×2 IMPLANT
COVER TIP SHEARS 8MM DA VINCI (MISCELLANEOUS) ×2
DECANTER SPIKE VIAL GLASS SM (MISCELLANEOUS) ×4 IMPLANT
DERMABOND ADVANCED (GAUZE/BANDAGES/DRESSINGS) ×6
DERMABOND ADVANCED .7 DNX12 (GAUZE/BANDAGES/DRESSINGS) ×4 IMPLANT
DRAPE ARM DVNC X/XI (DISPOSABLE) IMPLANT
DRAPE CAMERA CLOSED 9X96 (DRAPES) ×4 IMPLANT
DRAPE COLUMN DVNC XI (DISPOSABLE) IMPLANT
DRAPE DA VINCI XI ARM (DISPOSABLE)
DRAPE DA VINCI XI COLUMN (DISPOSABLE)
DRAPE WARM FLUID 44X44 (DRAPE) ×4 IMPLANT
DRSG TEGADERM 6X8 (GAUZE/BANDAGES/DRESSINGS) ×8 IMPLANT
ELECT CAUTERY BLADE 6.4 (BLADE) ×2 IMPLANT
ELECT REM PT RETURN 9FT ADLT (ELECTROSURGICAL) ×4
ELECTRODE REM PT RTRN 9FT ADLT (ELECTROSURGICAL) ×2 IMPLANT
GLOVE BIO SURGEON STRL SZ 6.5 (GLOVE) ×6 IMPLANT
GLOVE BIO SURGEONS STRL SZ 6.5 (GLOVE) ×2
GLOVE BIOGEL M STRL SZ7.5 (GLOVE) ×12 IMPLANT
GOWN STRL REUS W/TWL LRG LVL3 (GOWN DISPOSABLE) ×20 IMPLANT
HEMOSTAT SURGICEL 4X8 (HEMOSTASIS) ×2 IMPLANT
KIT ACCESSORY DA VINCI DISP (KITS) ×2
KIT ACCESSORY DVNC DISP (KITS) ×2 IMPLANT
KIT PROCEDURE DA VINCI SI (MISCELLANEOUS) ×2
KIT PROCEDURE DVNC SI (MISCELLANEOUS) ×2 IMPLANT
LOOP MINI RED (MISCELLANEOUS) ×2 IMPLANT
LOOP VESSEL MAXI BLUE (MISCELLANEOUS) ×2 IMPLANT
MANIFOLD NEPTUNE II (INSTRUMENTS) ×4 IMPLANT
NDL INSUFFLATION 14GA 120MM (NEEDLE) ×2 IMPLANT
NEEDLE INSUFFLATION 14GA 120MM (NEEDLE) ×4 IMPLANT
PACK CYSTO (CUSTOM PROCEDURE TRAY) ×4 IMPLANT
PACK ROBOT UROLOGY CUSTOM (CUSTOM PROCEDURE TRAY) ×4 IMPLANT
POSITIONER SURGICAL ARM (MISCELLANEOUS) ×8 IMPLANT
POUCH ENDO CATCH II 15MM (MISCELLANEOUS) ×2 IMPLANT
POUCH SPECIMEN RETRIEVAL 10MM (ENDOMECHANICALS) IMPLANT
PUMP PAIN ON-Q (MISCELLANEOUS) ×2 IMPLANT
RELOAD GOLD (STAPLE) ×2 IMPLANT
RELOAD GREEN (STAPLE) ×2 IMPLANT
RELOAD WHITE ECR60W (STAPLE) ×4 IMPLANT
SEAL CANN UNIV 5-8 DVNC XI (MISCELLANEOUS) IMPLANT
SEAL XI 5MM-8MM UNIVERSAL (MISCELLANEOUS)
SET TUBE IRRIG SUCTION NO TIP (IRRIGATION / IRRIGATOR) ×4 IMPLANT
SOLUTION ELECTROLUBE (MISCELLANEOUS) ×4 IMPLANT
SPONGE LAP 18X18 X RAY DECT (DISPOSABLE) ×4 IMPLANT
SPONGE LAP 4X18 X RAY DECT (DISPOSABLE) ×4 IMPLANT
STAPLE ECHEON FLEX 60 POW ENDO (STAPLE) ×2 IMPLANT
STENT SET URETHERAL LEFT 7FR (STENTS) ×2 IMPLANT
STENT SET URETHERAL RIGHT 7FR (STENTS) ×2 IMPLANT
SURGIFLO W/THROMBIN 8M KIT (HEMOSTASIS) IMPLANT
SUT CHROMIC 4 0 RB 1X27 (SUTURE) ×2 IMPLANT
SUT ETHILON 3 0 PS 1 (SUTURE) ×4 IMPLANT
SUT MNCRL AB 4-0 PS2 18 (SUTURE) ×8 IMPLANT
SUT PDS AB 0 CTX 36 PDP370T (SUTURE) ×12 IMPLANT
SUT PDS AB 1 CTX 36 (SUTURE) ×4 IMPLANT
SUT SILK 3 0 SH 30 (SUTURE) IMPLANT
SUT SILK 3 0 SH CR/8 (SUTURE) ×2 IMPLANT
SUT V-LOC BARB 180 2/0GR6 GS22 (SUTURE) ×4
SUT VIC AB 2-0 UR5 27 (SUTURE) ×8 IMPLANT
SUT VIC AB 3-0 SH 27 (SUTURE)
SUT VIC AB 3-0 SH 27X BRD (SUTURE) ×2 IMPLANT
SUT VIC AB 3-0 SH 27XBRD (SUTURE) ×2 IMPLANT
SUT VIC AB 4-0 PS2 18 (SUTURE) ×4 IMPLANT
SUT VIC AB 4-0 RB1 27 (SUTURE)
SUT VIC AB 4-0 RB1 27XBRD (SUTURE) ×8 IMPLANT
SUT VLOC BARB 180 ABS3/0GR12 (SUTURE) ×8
SUTURE V-LC BRB 180 2/0GR6GS22 (SUTURE) IMPLANT
SUTURE VLOC BRB 180 ABS3/0GR12 (SUTURE) ×2 IMPLANT
SYSTEM UROSTOMY GENTLE TOUCH (WOUND CARE) ×2 IMPLANT
TOWEL NATURAL 10PK STERILE (DISPOSABLE) ×4 IMPLANT
TOWEL OR NON WOVEN STRL DISP B (DISPOSABLE) ×4 IMPLANT
TROCAR 12M 150ML BLUNT (TROCAR) ×4 IMPLANT
TROCAR BLADELESS 15MM (ENDOMECHANICALS) ×4 IMPLANT
TUBING CONNECTING 10 (TUBING) ×3 IMPLANT
TUBING CONNECTING 10' (TUBING) ×1
URINEMETER 200ML W/220 (MISCELLANEOUS) ×2 IMPLANT
WATER STERILE IRR 1000ML UROMA (IV SOLUTION) ×4 IMPLANT
WATER STERILE IRR 1500ML POUR (IV SOLUTION) ×8 IMPLANT
YANKAUER SUCT BULB TIP 10FT TU (MISCELLANEOUS) IMPLANT

## 2014-02-17 NOTE — Anesthesia Preprocedure Evaluation (Addendum)
Anesthesia Evaluation  Patient identified by MRN, date of birth, ID band Patient awake    Reviewed: Allergy & Precautions, H&P , NPO status , Patient's Chart, lab work & pertinent test results  Airway Mallampati: II TM Distance: >3 FB Neck ROM: full    Dental  (+) Edentulous Upper, Dental Advisory Given, Missing   Pulmonary neg pulmonary ROS, former smoker,  breath sounds clear to auscultation  Pulmonary exam normal       Cardiovascular Exercise Tolerance: Good hypertension, Pt. on medications + Past MI Rhythm:regular Rate:Normal  Possible inf. MI on ECG   Neuro/Psych negative neurological ROS  negative psych ROS   GI/Hepatic negative GI ROS, Neg liver ROS,   Endo/Other  negative endocrine ROS  Renal/GU negative Renal ROS  negative genitourinary   Musculoskeletal   Abdominal   Peds  Hematology negative hematology ROS (+)   Anesthesia Other Findings   Reproductive/Obstetrics negative OB ROS                         Anesthesia Physical Anesthesia Plan  ASA: III  Anesthesia Plan: General   Post-op Pain Management:    Induction: Intravenous  Airway Management Planned: Oral ETT  Additional Equipment:   Intra-op Plan:   Post-operative Plan: Extubation in OR  Informed Consent: I have reviewed the patients History and Physical, chart, labs and discussed the procedure including the risks, benefits and alternatives for the proposed anesthesia with the patient or authorized representative who has indicated his/her understanding and acceptance.   Dental Advisory Given  Plan Discussed with: CRNA and Surgeon  Anesthesia Plan Comments:         Anesthesia Quick Evaluation

## 2014-02-17 NOTE — Progress Notes (Signed)
Blood pressure - 154/87

## 2014-02-17 NOTE — Discharge Instructions (Signed)
1. Activity:  You are encouraged to ambulate frequently (about every hour during waking hours) to help prevent blood clots from forming in your legs or lungs.  However, you should not engage in any heavy lifting (> 10-15 lbs), strenuous activity, or straining. 2. Diet: You should continue a clear liquid diet until passing gas from below.  Once this occurs, you may advance your diet to a soft diet that would be easy to digest (i.e soups, scrambled eggs, mashed potatoes, etc.) for 24 hours just as you would if getting over a bad stomach flu.  If tolerating this diet well for 24 hours, you may then begin eating regular food.  It will be normal to have some amount of bloating, nausea, and abdominal discomfort intermittently. 3. Prescriptions:  You will be provided a prescription for pain medication to take as needed.  If your pain is not severe enough to require the prescription pain medication, you may take Tylenol instead.  You should also take an over the counter stool softener (Colace 100 mg twice daily) to avoid straining with bowel movements as the pain medication may constipate you.  4. Catheter care: You will be taught how to take care of the catheter by the nursing staff prior to discharge from the hospital.  You may use both a leg bag and the larger bedside bag but it is recommended to at least use the bigger bedside bag at nighttime as the leg bag is small and will fill up overnight and also does not drain as well when lying flat. You may periodically feel a strong urge to void with the catheter in place.  This is a bladder spasm and most often can occur when having a bowel movement or when you are moving around. It is typically self-limited and usually will stop after a few minutes.  You may use some Vaseline or Neosporin around the tip of the catheter to reduce friction at the tip of the penis. 5. Incisions: You may remove your dressing bandages the 2nd day after surgery.  You most likely will have a  few small staples in each of the incisions and once the bandages are removed, the incisions may stay open to air.  You may start showering (not soaking or bathing in water) 48 hours after surgery and the incisions simply need to be patted dry after the shower.  No additional care is needed. 6. What to call us about: You should call the office [phone]) if you develop fever > 101, persistent vomiting, or the catheter stops draining. Also, feel free to call with any other questions you may have and remember the handout that was provided to you as a reference preoperatively which answers many of the common questions that arise after surgery.  You may resume aspirin, advil, aleve, vitamins, and supplements 7 days after surgery.

## 2014-02-17 NOTE — Progress Notes (Signed)
Labetalol 5 mg IVP given for elevated blood pressures. 

## 2014-02-17 NOTE — H&P (Signed)
Dustin Jordan is an 68 y.o. male.    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 05/2013 - TURBT at least T1 high-grade unable to completely resect endoscopically, did not keep follow-up. 12/2013 - TURBT at with large sessile dome tumor again at least T1G3. 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 CT clinicall localized. Prior smoker, now quit. Mass NOT amenable to complete endoscopic resection.  PMH sig for HTN, Goiter (some tracheal deviation),  No CV disease. No blood thinners.  Today Dustin Jordan is seen to proceed with robotic partial cystectomy. He has been extensively counseled previously.    Past Medical History  Diagnosis Date  . Hypertension   . White coat hypertension   . Swelling of right knee joint     s/p arthroscopy 06-08-2013  . Bladder cancer   . Hematuria   . Frequency of urination     Past Surgical History  Procedure Laterality Date  . Knee arthroscopy Right 06-08-2013  . Cystoscopy w/ retrogrades Bilateral 06/22/2013    Procedure: CYSTOSCOPY WITH RETROGRADE PYELOGRAM;  Surgeon: Alexis Frock, MD;  Location: [facility];  Service: Urology;  Laterality: Bilateral;  . Transurethral resection of bladder tumor with gyrus (turbt-gyrus) N/A 06/22/2013    Procedure: TRANSURETHRAL RESECTION OF BLADDER TUMOR WITH GYRUS (TURBT-GYRUS);  Surgeon: Alexis Frock, MD;  Location: [facility];  Service: Urology;  Laterality: N/A;  . Cystoscopy with retrograde pyelogram, ureteroscopy and stent placement Bilateral 01/02/2014    Procedure: CYSTOSCOPY WITH RETROGRADE PYELOGRAM BILATERAL URETERS  AND TRANSURETHRAL RESECTION OF A BLADDER TUMOR WITH MULTIPLE BX;  Surgeon: Alexis Frock, MD;  Location: [facility];  Service: Urology;  Laterality: Bilateral;    No family history on file. Social History:   reports that he quit smoking about 3 years ago. His smoking use included Cigarettes. He has a 20 pack-year smoking history. He has never used smokeless tobacco. He reports that he does not drink alcohol or use illicit drugs.  Allergies: No Known Allergies  No prescriptions prior to admission    No results found for this or any previous visit (from the past 48 hour(s)). No results found.  Review of Systems  Constitutional: Negative.  Negative for fever and chills.  HENT: Negative.   Eyes: Negative.   Respiratory: Negative.   Cardiovascular: Negative.   Gastrointestinal: Negative.   Genitourinary: Negative.   Musculoskeletal: Negative.   Skin: Negative.   Neurological: Negative.   Endo/Heme/Allergies: Negative.   Psychiatric/Behavioral: Negative.     There were no vitals taken for this visit. Physical Exam  Constitutional: He appears well-developed and well-nourished.  HENT:  Head: Normocephalic and atraumatic.  Eyes: EOM are normal. Pupils are equal, round, and reactive to light.  Neck: Normal range of motion. Neck supple.  Cardiovascular: Normal rate and regular rhythm.   Respiratory: Effort normal and breath sounds normal.  GI: Soft. Bowel sounds are normal.  Genitourinary: Penis normal.  Musculoskeletal: Normal range of motion.  Neurological: He is alert.  Skin: Skin is warm and dry.  Psychiatric: He has a normal mood and affect. His behavior is normal. Judgment and thought content normal.     Assessment/Plan  1 - High-Grade Bladder Dome Cancer - We rediscussed the role of partial cystectomy + lymph node dissection with the overall goal of complete surgical excision (negative margins) and better staging /  diagnosis. We specifically rediscussed alternatives including radical cystectomy (considered gold-standard, lower risk of recurrence / progression) chemo-radiation, palliative therapies, and the role of neoadjuvant chemotherapy. We then rediscussed surgical approaches  including robotic and open techniques with robotic associated with a shorter convalescence. I showed the patient on their abdomen the approximately 4-6 incision (trocar) sites as well as presumed extraction sites with robotic approach as well as possible open incision sites. We specifically readdressed that there may be need to alter operative plans according to intraopertive findings including conversion to open procedure. We rediscussed specific peri-operative risks including bleeding, infection, deep vein thrombosis, pulmonary embolism, compartment syndrome, nuropathy / neuropraxia, injury to adjacent structures, heart attack, stroke, death, as well as long-term risks such as non-cure / need for additional therapy and need for imaging and lab based post-op surveillance protocols. We discussed typical hospital course of approximately 2-3 day hospitalization, need for peri-operative drains / catheters, and typical post-hospital course with return to most non-strenuous activities by 4 weeks and ability to return to most jobs and more strenuous activity such as exercise by 8 weeks but with complete return to baseline often taking 2mos plus.  After this lengthy and detail discussion, including answering all of the patient's questions to their satisfaction, they have chosen to proceed with robotic partial cystectomy today as planned, will arrange with concomitatn cysto and ICG dye for lymphangiography.   Alexis Frock 02/17/2014, 5:44 AM

## 2014-02-17 NOTE — Brief Op Note (Signed)
02/17/2014  4:31 PM  PATIENT:  Dustin Jordan  68 y.o. male  PRE-OPERATIVE DIAGNOSIS:  BLADDER CANCER  POST-OPERATIVE DIAGNOSIS:  bladder cancer  PROCEDURE:  Procedure(s): ROBOTIC ASSISTED LAPAROSCOPIC PARTIAL CYSTECTOMY (N/A) BILATERAL LYMPH NODE DISSECTION (Bilateral) CYSTOSCOPY WITH INDOCYANINE GREEN DYE (N/A)  SURGEON:  Surgeon(s) and Role:    * Alexis Frock, MD - Primary  PHYSICIAN ASSISTANT:   ASSISTANTS: Felipa Furnace, PA   ANESTHESIA:   local and general  EBL:  Total I/O In: 1000 [I.V.:1000] Out: -   BLOOD ADMINISTERED:none  DRAINS: JP, Foley   LOCAL MEDICATIONS USED:  MARCAINE     SPECIMEN:  Source of Specimen:  pelvic lymph nodes, partial cystectomy, revised anterior  DISPOSITION OF SPECIMEN:  PATHOLOGY  COUNTS:  YES  TOURNIQUET:  * No tourniquets in log *  DICTATION: .Other Dictation: Dictation Number [id number]  PLAN OF CARE: Admit to inpatient   PATIENT DISPOSITION:  PACU - hemodynamically stable.   Delay start of Pharmacological VTE agent (>24hrs) due to surgical blood loss or risk of bleeding: yes

## 2014-02-17 NOTE — Progress Notes (Signed)
Dr. Landry Dyke made aware of patient's blood pressures- orders written.

## 2014-02-17 NOTE — Anesthesia Postprocedure Evaluation (Signed)
  Anesthesia Post-op Note  Patient: Dustin Jordan  Procedure(s) Performed: Procedure(s) (LRB): ROBOTIC ASSISTED LAPAROSCOPIC PARTIAL CYSTECTOMY (N/A) BILATERAL LYMPH NODE DISSECTION (Bilateral) CYSTOSCOPY WITH INDOCYANINE GREEN DYE (N/A)  Patient Location: PACU  Anesthesia Type: General  Level of Consciousness: awake and alert   Airway and Oxygen Therapy: Patient Spontanous Breathing  Post-op Pain: mild  Post-op Assessment: Post-op Vital signs reviewed, Patient's Cardiovascular Status Stable, Respiratory Function Stable, Patent Airway and No signs of Nausea or vomiting  Last Vitals:  Filed Vitals:   02/17/14 2220  BP: 119/78  Pulse: 76  Temp: 36.8 C  Resp: 12    Post-op Vital Signs: stable   Complications: No apparent anesthesia complications

## 2014-02-17 NOTE — Progress Notes (Signed)
Hgb. And Hct. Drawn  By lab.

## 2014-02-17 NOTE — Transfer of Care (Signed)
Immediate Anesthesia Transfer of Care Note  Patient: Dustin Jordan  Procedure(s) Performed: Procedure(s): ROBOTIC ASSISTED LAPAROSCOPIC PARTIAL CYSTECTOMY (N/A) BILATERAL LYMPH NODE DISSECTION (Bilateral) CYSTOSCOPY WITH INDOCYANINE GREEN DYE (N/A)  Patient Location: PACU  Anesthesia Type:General  Level of Consciousness: awake, alert  and oriented  Airway & Oxygen Therapy: Patient Spontanous Breathing and Patient connected to face mask oxygen  Post-op Assessment: Report given to PACU RN and Post -op Vital signs reviewed and stable  Post vital signs: Reviewed and stable  Complications: No apparent anesthesia complications

## 2014-02-17 NOTE — Progress Notes (Signed)
Hgb. 12.7- Hct. 40.7- results noted

## 2014-02-18 LAB — BASIC METABOLIC PANEL
BUN: 15 mg/dL (ref 6–23)
CALCIUM: 8.8 mg/dL (ref 8.4–10.5)
CO2: 26 mEq/L (ref 19–32)
CREATININE: 1.45 mg/dL — AB (ref 0.50–1.35)
Chloride: 101 mEq/L (ref 96–112)
GFR, EST AFRICAN AMERICAN: 56 mL/min — AB (ref >90)
GFR, EST NON AFRICAN AMERICAN: 48 mL/min — AB (ref >90)
GLUCOSE: 131 mg/dL — AB (ref 70–99)
Potassium: 4.3 mEq/L (ref 3.7–5.3)
Sodium: 137 mEq/L (ref 137–147)

## 2014-02-18 LAB — HEMOGLOBIN AND HEMATOCRIT, BLOOD
HCT: 34.5 % — ABNORMAL LOW (ref 39.0–52.0)
HEMOGLOBIN: 10.8 g/dL — AB (ref 13.0–17.0)

## 2014-02-18 MED ORDER — OXYBUTYNIN CHLORIDE 5 MG PO TABS
5.0000 mg | ORAL_TABLET | Freq: Three times a day (TID) | ORAL | Status: DC | PRN
  Administered 2014-02-18 – 2014-02-19 (×2): 5 mg via ORAL
  Filled 2014-02-18 (×2): qty 1

## 2014-02-18 NOTE — Op Note (Signed)
NAMEJESSON, [name] NO.:  [id number]  MEDICAL RECORD NO.:  [id number]  LOCATION:  6761                         FACILITY:  [facility]  PHYSICIAN:  Alexis Frock, MD     DATE OF BIRTH:  03-Mar-1946  DATE OF PROCEDURE:  02/17/2014 DATE OF DISCHARGE:                              OPERATIVE REPORT   DIAGNOSIS:  Large high-grade bladder dome cancer.  PROCEDURE: 1. Robotic-assisted laparoscopic partial cystectomy. 2. Bilateral pelvic lymphadenectomy. 3. Cystoscopy with dye injection. 4. ICG lymphangiography.  ESTIMATED BLOOD LOSS:  Less than 50 mL.  COMPLICATIONS:  None.  SPECIMENS: 1. Right external iliac lymph nodes. 2. Right perivesical lymph node, sentinel. 3. Right obturator lymph nodes. 4. Right common iliac lymph nodes. 5. Aortic bifurcation lymph nodes. 6. Left external iliac lymph nodes. 7. Left obturator lymph nodes. 8. Partial cystectomy. 9. Revised anterior margin; all for permanent pathology.  DRAINS: 1. Jackson-Pratt drain bulb suction. 2. Foley catheter straight drain.  ASSISTANT:  Leta Baptist, PA.  FINDINGS: 1. Large sessile bladder dome tumor by cystoscopy. 2. Area of right perivesical sentinel lymph node drainage along the     right bladder pedicle, otherwise no sentinel lymph nodes with ICG     lymphangiography. 3. Excellent marking of tumor borders with ICG marking.  INDICATION:  [name] is a pleasant 68 year old gentleman with history of variable medical compliance.  He has had a high-grade bladder dome tumor for some time.  He underwent initial resection over a year ago and failed to follow up.  He represented with recurrence of tumor and a clot retention and underwent restaging imaging, restaging resection, which corroborated once again high-grade large dome tumor.  This was not amenable to complete endoscopic resection given its size and location. Options were discussed including radical cystectomy versus chemo  and radiation versus partial cystectomy and he wished to proceed with the latter understanding possible inferior oncologic outcomes.  Informed consent was obtained and placed in medical record.  PROCEDURE IN DETAIL:  The patient being Dustin Jordan and procedure being robotic partial cystectomy was confirmed.  Procedure was carried out.  Time-out was performed.  Intravenous antibiotics were administered.  General endotracheal anesthesia was introduced.  The patient was placed into a low lithotomy position.  Sterile field was created by prepping and draping the patient's penis, perineum, and proximal thighs using iodine x3.  Next, cystourethroscopy was performed using a 24-French injection with cystoscope.  Inspection of the anterior and posterior urethra unremarkable.  Inspection of the urinary bladder revealed a large predominantly sessile bladder dome tumor.  There were no satellite lesions grossly.  The edges of this were then injected using small aliquots of 10 mL of ICG in total into the seromuscular layer, approximately 2 cm away from the gross borders of the tumor to help with a tumor marking and lymphangiography.  The injection scope was then removed and the patient was repositioned into a full Trendelenburg position with tape across his chest with padding and re-prepping and draping his infra xiphoid, abdomen using chlorhexidine gluconate, and his penis, perineum, and proximal thighs using iodine x3.  Foley catheter was placed for easier straight drain.  Next, high-flow low pressure pneumoperitoneum  was obtained using Veress technique in the supraumbilical midline having passed the aspiration and drop test, and a 12-mm robotic camera port was placed into this location.  Laparoscopic examination of the peritoneal cavity revealed no significant adhesions, no visceral injury.  There was ICG tattooing of the borders of the tumor even grossly, that was successful.  Additional ports  were then placed as follows:  Left paramedian 8-mm robotic port and left far lateral 8-mm robotic port, right paramedian 8-mm robotic port, right far lateral 12- mm assist port, right paramedian 5 mm dissection port.  Robot was docked and passed its electronic checks. Attention was then directed at lymphangiography using near infrared fluorescence light.  The entire pelvis was interrogated.  The borders of the tumor were clearly visible and there was 1 area of sentinel nodes corresponding to likely a perivesical group on the right side in the pelvis.  The incision was made lateral to the right medial umbilical ligaments from the area of the internal ring coursing along the course of the iliac vessels following towards the area of the aortic bifurcation. First, the right external iliac lymph node group was dissected with the confines being external iliac artery, vein, pelvic side wall, and ureter, these were set aside, labeled external iliac lymph nodes.  Next, right obturator group was mobilized and set aside for permanent pathology, confines being external iliac vein, pelvic side wall, obturator nerve.  Lymphostasis was achieved with cold clips.  Common group was then dissected with confines being the ureter laterally and aortic bifurcation superomedially.  Lymphostasis was achieved with cold clips.  Targeted tissue overlying the area of the aortic bifurcation was also mobilized with very careful lymphostasis achieved with cold clips. This was set aside and labeled aortic bifurcation.  A separate single sentinel node was seen to go along the course of the superior bladder pedicle.  Lymphostasis was achieved with cold clips and set aside, labeled right perivesical lymph node, sentinel.  Mirror image lymphadenectomy was performed similarly on the left side, taking the left external iliac and obturator groups.  There were scant lymphoid tissue overlying the common iliac on the left,  therefore no tissue was taken.  The ureter was visualized and uninjured throughout its course as was the left obturator nerve.  Again lymphostasis was achieved with cold clips.  Attention was directed at partial cystectomy.  The bladder was filled with 250 mL.  The pelvis was interrogated once again under near infrared fluorescence light where the margins of previously marked tumor were clearly seen.  Very careful systematic resection was performed in a circumferential fashion following previously marked borders to what appeared to be the bladder mucosa.  The bladder mucosa was then entered in the anterior plane thus allowing inspection both the inside and outside of the bladder and very careful resection was performed of this bladder dome tumor keeping what appeared to be at least a 1 to 2 cm margin around the tumor.  This was placed in the EndoCatch bag for later retrieval.  The anterior aspect appeared to be somewhat closer than desired, therefore revised anterior margin was taken and set aside for permanent pathology.  The ureteral orifices were inspected and there was clear efflux of urine from these.  The area of partial cystectomy as well away from this.  Repair of the bladder was then performed in several layers, first with a running 3-0 V-Loc at the area of the bladder mucosa and muscle followed by an imbricating layer of 2-0  V-Loc followed by 0 V-Loc at the level of the peritoneum overlying the bladder.  The bladder was then irrigated with 100 mL of fluid. NO large leaks were detected.  Closed suction drain was then brought through the previous left lateral most robotic port site in the area of the pelvis.  The robotic port site was closed to the level of the fascia using a Carter-Thomason suture passer.  Robot was then undocked. Specimen was retrieved by extending the previous camera port site for a total distance of roughly 3 cm, removing the partial cystectomy specimen.  This  site was closed at the level of the fascia using figure- of-eight PDS x3 followed by Scarpa's using running Vicryl.  Drain system was applied.  All skin incisions were infiltrated with dilute lipolized Marcaine and then reapproximated using subcuticular Monocryl followed by Dermabond.  Procedure was then terminated.  The patient tolerated the procedure well.  There were no immediate periprocedural complications. The patient was taken to the postanesthesia care unit in stable condition.          ______________________________ Alexis Frock, MD     TM/MEDQ  D:  02/17/2014  T:  02/18/2014  Job:  [id number]

## 2014-02-18 NOTE — Progress Notes (Signed)
Feels good Few bladder spasms Belly looks normal Hb normal *serum Cr increased from 1.09 to 1.45 JP 450 Advance diet and monitor labs

## 2014-02-19 LAB — BASIC METABOLIC PANEL
BUN: 13 mg/dL (ref 6–23)
CALCIUM: 9.4 mg/dL (ref 8.4–10.5)
CHLORIDE: 100 meq/L (ref 96–112)
CO2: 28 mEq/L (ref 19–32)
CREATININE: 1.56 mg/dL — AB (ref 0.50–1.35)
GFR calc Af Amer: 51 mL/min — ABNORMAL LOW (ref >90)
GFR, EST NON AFRICAN AMERICAN: 44 mL/min — AB (ref >90)
Glucose, Bld: 111 mg/dL — ABNORMAL HIGH (ref 70–99)
Potassium: 4.9 mEq/L (ref 3.7–5.3)
Sodium: 136 mEq/L — ABNORMAL LOW (ref 137–147)

## 2014-02-19 LAB — CREATININE, FLUID (PLEURAL, PERITONEAL, JP DRAINAGE): Creat, Fluid: >50 mg/dL

## 2014-02-19 MED ORDER — ZOLPIDEM TARTRATE 5 MG PO TABS: 5.0000 mg | ORAL_TABLET | Freq: Every evening | ORAL | Status: DC | PRN

## 2014-02-19 MED ORDER — ONDANSETRON HCL 4 MG/2ML IJ SOLN: 4.0000 mg | INTRAMUSCULAR | Status: DC | PRN

## 2014-02-19 MED ORDER — OXYBUTYNIN CHLORIDE 5 MG PO TABS
5.0000 mg | ORAL_TABLET | Freq: Three times a day (TID) | ORAL | Status: DC
  Administered 2014-02-19 – 2014-02-21 (×9): 5 mg via ORAL
  Filled 2014-02-19 (×12): qty 1

## 2014-02-19 MED ORDER — BELLADONNA ALKALOIDS-OPIUM 16.2-60 MG RE SUPP
1.0000 | Freq: Four times a day (QID) | RECTAL | Status: DC | PRN
  Administered 2014-02-19 – 2014-02-20 (×4): 1 via RECTAL
  Filled 2014-02-19 (×5): qty 1

## 2014-02-19 MED ORDER — PHENOL 1.4 % MT LIQD
1.0000 | OROMUCOSAL | Status: DC | PRN
  Filled 2014-02-19: qty 177

## 2014-02-19 MED ORDER — ACETAMINOPHEN 325 MG PO TABS: 650.0000 mg | ORAL_TABLET | ORAL | Status: DC | PRN

## 2014-02-19 MED ORDER — BACITRACIN-NEOMYCIN-POLYMYXIN 400-5-5000 EX OINT: 1.0000 "application " | TOPICAL_OINTMENT | Freq: Three times a day (TID) | CUTANEOUS | Status: DC | PRN

## 2014-02-19 MED ORDER — MENTHOL 3 MG MT LOZG
1.0000 | LOZENGE | OROMUCOSAL | Status: DC | PRN
  Filled 2014-02-19: qty 9

## 2014-02-19 MED ORDER — BISACODYL 10 MG RE SUPP: 10.0000 mg | Freq: Every day | RECTAL | Status: DC | PRN

## 2014-02-19 NOTE — Progress Notes (Signed)
2 Days Post-Op Subjective: Patient reports: Suprapubic pain.  No BM yet.  Objective: Vital signs in last 24 hours: Temp:  [98 F (36.7 C)-98.1 F (36.7 C)] 98 F (36.7 C) (05/24 0408) Pulse Rate:  [65-73] 65 (05/24 0408) Resp:  [18-20] 18 (05/24 0408) BP: (101-158)/(61-75) 112/69 mmHg (05/24 0408) SpO2:  [99 %-100 %] 99 % (05/24 0408)  Intake/Output from previous day: 05/23 0701 - 05/24 0700 In: 2085 [P.O.:360; I.V.:1725] Out: 1980 [Urine:1600; Drains:380] Intake/Output this shift: Total I/O In: -  Out: 70 [Drains:70]  Physical Exam:  General:  Abdomen: Soft, tender in suprapubic area. Wounds clean and dry. Foley draining clear urine. JP darinage: 380 ml yesterday.  70 ml thus far today. Creatinine: 1.56.  Was 1.45 yesterday and 1.09 pre-op.  Lab Results:  Recent Labs  02/17/14 1732 02/18/14 0436  HGB 12.7* 10.8*  HCT 40.7 34.5*   BMET  Recent Labs  02/18/14 0436 02/19/14 0703  NA 137 136*  K 4.3 4.9  CL 101 100  CO2 26 28  GLUCOSE 131* 111*  BUN 15 13  CREATININE 1.45* 1.56*  CALCIUM 8.8 9.4   No results found for this basename: LABPT, INR,  in the last 72 hours No results found for this basename: LABURIN,  in the last 72 hours Results for orders placed during the hospital encounter of 01/01/14  URINE CULTURE     Status: None   Collection Time    01/01/14  6:01 AM      Result Value Ref Range Status   Specimen Description URINE, CATHETERIZED   Final   Special Requests NONE   Final   Culture  Setup Time     Final   Value: 01/01/2014 13:21     Performed at [facility]     Final   Value: >=100,000 COLONIES/ML     Performed at [facility]   Culture     Final   Value: STAPHYLOCOCCUS SPECIES (COAGULASE NEGATIVE)     Note: RIFAMPIN AND GENTAMICIN SHOULD NOT BE USED AS SINGLE DRUGS FOR TREATMENT OF STAPH INFECTIONS.     Performed at [facility]   Report Status 01/03/2014 FINAL   Final   Organism ID, Bacteria  STAPHYLOCOCCUS SPECIES (COAGULASE NEGATIVE)   Final  SURGICAL PCR SCREEN     Status: None   Collection Time    01/02/14  8:16 AM      Result Value Ref Range Status   MRSA, PCR NEGATIVE  NEGATIVE Final   Staphylococcus aureus NEGATIVE  NEGATIVE Final   Comment:            The Xpert SA Assay (FDA     approved for NASAL specimens     in patients over 78 years of age),     is one component of     a comprehensive surveillance     program.  Test performance has     been validated by [employer] for patients greater     than or equal to 66 year old.     It is not intended     to diagnose infection nor to     guide or monitor treatment.    Studies/Results: No results found.  Assessment/Plan: Bladder cancer S/P robotic assisted laparoscopic partial cystectomy. Check JP drainage for creatinine Advance diet   LOS: 2 days   Arvil Persons 02/19/2014, 8:33 AM

## 2014-02-19 NOTE — Progress Notes (Addendum)
39ml light pink drainage from JP this shift.  128ml yellow urine from foley this shift.  JP insertion site leaking ss fluid, saturating dsg and requiring dsg change and reinforcement.  Dr Janice Norrie on unit and made aware of above.  No new orders received.

## 2014-02-20 ENCOUNTER — Inpatient Hospital Stay (HOSPITAL_COMMUNITY): Payer: Medicare Other

## 2014-02-20 LAB — BASIC METABOLIC PANEL
BUN: 24 mg/dL — AB (ref 6–23)
CALCIUM: 10.2 mg/dL (ref 8.4–10.5)
CO2: 26 mEq/L (ref 19–32)
CREATININE: 2.87 mg/dL — AB (ref 0.50–1.35)
Chloride: 98 mEq/L (ref 96–112)
GFR calc Af Amer: 25 mL/min — ABNORMAL LOW (ref >90)
GFR, EST NON AFRICAN AMERICAN: 21 mL/min — AB (ref >90)
Glucose, Bld: 131 mg/dL — ABNORMAL HIGH (ref 70–99)
Potassium: 5.4 mEq/L — ABNORMAL HIGH (ref 3.7–5.3)
Sodium: 134 mEq/L — ABNORMAL LOW (ref 137–147)

## 2014-02-20 LAB — CBC
HEMATOCRIT: 39 % (ref 39.0–52.0)
Hemoglobin: 12.4 g/dL — ABNORMAL LOW (ref 13.0–17.0)
MCH: 26.4 pg (ref 26.0–34.0)
MCHC: 31.8 g/dL (ref 30.0–36.0)
MCV: 83.2 fL (ref 78.0–100.0)
Platelets: 281 10*3/uL (ref 150–400)
RBC: 4.69 MIL/uL (ref 4.22–5.81)
RDW: 13.5 % (ref 11.5–15.5)
WBC: 12.5 10*3/uL — ABNORMAL HIGH (ref 4.0–10.5)

## 2014-02-20 MED ORDER — DEXTROSE-NACL 5-0.45 % IV SOLN
INTRAVENOUS | Status: DC
  Administered 2014-02-20 – 2014-02-21 (×3): via INTRAVENOUS

## 2014-02-20 NOTE — Progress Notes (Signed)
3 Days Post-Op Subjective: Patient reports : Mild to moderate abdominal pain, but less than yesterday.  No nausea.    Objective: Vital signs in last 24 hours: Temp:  [97.9 F (36.6 C)-98.6 F (37 C)] 97.9 F (36.6 C) (05/25 0531) Pulse Rate:  [78-85] 78 (05/25 0531) Resp:  [18-20] 20 (05/25 0531) BP: (91-142)/(50-73) 141/71 mmHg (05/25 0531) SpO2:  [100 %] 100 % (05/25 0531)  Intake/Output from previous day: 05/24 0701 - 05/25 0700 In: 1740 [P.O.:720; I.V.:1020] Out: 965 [Urine:245; Drains:720] Intake/Output this shift: Total I/O In: -  Out: 90 [Drains:90]  Physical Exam:  General:  Abdomen: Soft but tender in mid abdomen and suprapubic area. Urinary output: 245 ml yesterday JP drainage: 720 ml yesterday.  90 ml thus far today JP drainage creatinine: >50  Lab Results:  Recent Labs  02/17/14 1732 02/18/14 0436  HGB 12.7* 10.8*  HCT 40.7 34.5*   BMET  Recent Labs  02/18/14 0436 02/19/14 0703  NA 137 136*  K 4.3 4.9  CL 101 100  CO2 26 28  GLUCOSE 131* 111*  BUN 15 13  CREATININE 1.45* 1.56*  CALCIUM 8.8 9.4   No results found for this basename: LABPT, INR,  in the last 72 hours No results found for this basename: LABURIN,  in the last 72 hours Results for orders placed during the hospital encounter of 01/01/14  URINE CULTURE     Status: None   Collection Time    01/01/14  6:01 AM      Result Value Ref Range Status   Specimen Description URINE, CATHETERIZED   Final   Special Requests NONE   Final   Culture  Setup Time     Final   Value: 01/01/2014 13:21     Performed at [facility]     Final   Value: >=100,000 COLONIES/ML     Performed at [facility]   Culture     Final   Value: STAPHYLOCOCCUS SPECIES (COAGULASE NEGATIVE)     Note: RIFAMPIN AND GENTAMICIN SHOULD NOT BE USED AS SINGLE DRUGS FOR TREATMENT OF STAPH INFECTIONS.     Performed at [facility]   Report Status 01/03/2014 FINAL   Final   Organism  ID, Bacteria STAPHYLOCOCCUS SPECIES (COAGULASE NEGATIVE)   Final  SURGICAL PCR SCREEN     Status: None   Collection Time    01/02/14  8:16 AM      Result Value Ref Range Status   MRSA, PCR NEGATIVE  NEGATIVE Final   Staphylococcus aureus NEGATIVE  NEGATIVE Final   Comment:            The Xpert SA Assay (FDA     approved for NASAL specimens     in patients over 77 years of age),     is one component of     a comprehensive surveillance     program.  Test performance has     been validated by [employer] for patients greater     than or equal to 35 year old.     It is not intended     to diagnose infection nor to     guide or monitor treatment.    Studies/Results: No results found.  Assessment/Plan:  S/P partial cystectomy  Check BUN and creatinine today.  Increase IV fluids.  Monitor urinary output.   LOS: 3 days   Arvil Persons 02/20/2014, 10:27 AM

## 2014-02-20 NOTE — Progress Notes (Signed)
Patient ID: Dustin Jordan, male   DOB: 11/28/1945, 68 y.o.   MRN: [id number] Creatinine: 2.87 was 1.56 on 5/24.  K: 5.4 JP drainage : 200 ml. Will get CT cystogram to rule out intraperitoneal extravasation of urine.

## 2014-02-20 NOTE — Progress Notes (Signed)
Patient ID: Dustin Jordan, male   DOB: 09-30-1945, 69 y.o.   MRN: [id number] CT cystogram: extraperitoneal extravasation of contrast.  ? Small intraperitoneal leak. Patient feels fine.  Abdominal pain is less. Clear urine in Foley bag. Since extravasation is extraperitoneal will manage it conservatively.

## 2014-02-21 ENCOUNTER — Encounter (HOSPITAL_COMMUNITY): Payer: Self-pay | Admitting: Urology

## 2014-02-21 LAB — COMPREHENSIVE METABOLIC PANEL
ALBUMIN: 2.8 g/dL — AB (ref 3.5–5.2)
ALT: 8 U/L (ref 0–53)
AST: 11 U/L (ref 0–37)
Alkaline Phosphatase: 51 U/L (ref 39–117)
BUN: 26 mg/dL — ABNORMAL HIGH (ref 6–23)
CO2: 25 mEq/L (ref 19–32)
Calcium: 9.6 mg/dL (ref 8.4–10.5)
Chloride: 98 mEq/L (ref 96–112)
Creatinine, Ser: 2.76 mg/dL — ABNORMAL HIGH (ref 0.50–1.35)
GFR calc Af Amer: 26 mL/min — ABNORMAL LOW (ref >90)
GFR calc non Af Amer: 22 mL/min — ABNORMAL LOW (ref >90)
Glucose, Bld: 116 mg/dL — ABNORMAL HIGH (ref 70–99)
Potassium: 4.9 mEq/L (ref 3.7–5.3)
SODIUM: 133 meq/L — AB (ref 137–147)
Total Bilirubin: 0.7 mg/dL (ref 0.3–1.2)
Total Protein: 7 g/dL (ref 6.0–8.3)

## 2014-02-21 LAB — CBC
HCT: 35 % — ABNORMAL LOW (ref 39.0–52.0)
Hemoglobin: 11.1 g/dL — ABNORMAL LOW (ref 13.0–17.0)
MCH: 26.4 pg (ref 26.0–34.0)
MCHC: 31.7 g/dL (ref 30.0–36.0)
MCV: 83.3 fL (ref 78.0–100.0)
Platelets: 255 10*3/uL (ref 150–400)
RBC: 4.2 MIL/uL — ABNORMAL LOW (ref 4.22–5.81)
RDW: 13.4 % (ref 11.5–15.5)
WBC: 11.3 10*3/uL — ABNORMAL HIGH (ref 4.0–10.5)

## 2014-02-21 MED ORDER — SULFAMETHOXAZOLE-TMP DS 800-160 MG PO TABS
1.0000 | ORAL_TABLET | Freq: Two times a day (BID) | ORAL | Status: DC
  Administered 2014-02-21 (×2): 1 via ORAL
  Filled 2014-02-21 (×4): qty 1

## 2014-02-21 NOTE — Care Management Note (Addendum)
    Page 1 of 1   02/22/2014     9:53:51 AM CARE MANAGEMENT NOTE 02/22/2014  Patient:  Dustin Jordan, Dustin Jordan   Account Number:  [id number]  Date Initiated:  02/21/2014  Documentation initiated by:  Dessa Phi  Subjective/Objective Assessment:   68 Y/O M ADMITTED W/BLADDER CA.     Action/Plan:   FROM HOME.HAS PCP,PHARMACY.   Anticipated DC Date:  02/22/2014   Anticipated DC Plan:  [place]  CM consult      Choice offered to / List presented to:             Status of service:  Completed, signed off Medicare Important Message given?  YES (If response is "NO", the following Medicare IM given date fields will be blank) Date Medicare IM given:  02/21/2014 Date Additional Medicare IM given:    Discharge Disposition:  HOME/SELF CARE  Per UR Regulation:  Reviewed for med. necessity/level of care/duration of stay  If discussed at [place] of Stay Meetings, dates discussed:    Comments:  02/21/14 Linton Stolp RN,BSN NCM [phone] POD#4 PARTIAL CYSTECTOMY.JP DRAIN.NO ANTICIPATED D/C NEEDS.

## 2014-02-21 NOTE — Progress Notes (Signed)
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   2 - Urine Leak - pt with high JP output post-op, up to 50% of urine output with corresponding rise in Cr c/w systemic resorption.  CT Cystogram 5/25 confirms small anterior dome leak at site of bladder repair, small volume, extra-peritoneal. JP taken off suction 5/25.   Today Dustin Jordan is w/o complaints. He is ambulatory most of day, tolerating PO intake, pain controlled. No fevers.   Objective: Vital signs in last 24 hours: Temp:  [97.8 F (36.6 C)-98.7 F (37.1 C)] 98.7 F (37.1 C) (05/26 0550) Pulse Rate:  [79-95] 79 (05/26 0550) Resp:  [18-20] 18 (05/26 0550) BP: (104-136)/(67-83) 136/80 mmHg (05/26 0550) SpO2:  [100 %] 100 % (05/26 0550) Last BM Date: 02/17/14  Intake/Output from previous day: 05/25 0701 - 05/26 0700 In: 2710.8 [P.O.:660; I.V.:2050.8] Out: 1075 [Urine:500; Drains:575] Intake/Output this shift:    General appearance: alert, cooperative, appears stated age and ambulating in hall Ears: normal TM's and external ear canals both ears Nose: Nares normal. Septum midline. Mucosa normal. No drainage or sinus tenderness. Throat: lips, mucosa, and tongue normal; teeth and gums normal Neck: supple, symmetrical, trachea midline Back: symmetric, no curvature. ROM normal. No CVA tenderness. Resp: non-labored Chest wall: no tenderness Cardio: Nl rate GI: soft, non-tender; bowel sounds normal; no masses,  no organomegaly Male genitalia: normal, foley c/d/i with clear urine in foely bad which is dependant.  Extremities: extremities normal, atraumatic, no cyanosis or edema Pulses: 2+ and symmetric Skin: Skin color, texture, turgor normal. No rashes or lesions Lymph nodes: Cervical, supraclavicular, and axillary nodes  normal. Neurologic: Grossly normal Incision/Wound: recent port sites / extraction sites all c/d/i. JP with scant uriniferous fluid in bulb off suction.   Lab Results:   Recent Labs  02/20/14 1110 02/21/14 0353  WBC 12.5* 11.3*  HGB 12.4* 11.1*  HCT 39.0 35.0*  PLT 281 255   BMET  Recent Labs  02/20/14 1110 02/21/14 0353  NA 134* 133*  K 5.4* 4.9  CL 98 98  CO2 26 25  GLUCOSE 131* 116*  BUN 24* 26*  CREATININE 2.87* 2.76*  CALCIUM 10.2 9.6   PT/INR No results found for this basename: LABPROT, INR,  in the last 72 hours ABG No results found for this basename: PHART, PCO2, PO2, HCO3,  in the last 72 hours  Studies/Results: Ct Pelvis Wo Contrast  02/20/2014   CLINICAL DATA:  68 year old male status post partial bladder resection with increasing drainage. Evaluate for leak.  EXAM: CT PELVIS WITHOUT CONTRAST  TECHNIQUE: Multidetector CT imaging of the pelvis was performed following the standard protocol without intravenous contrast.  COMPARISON:  01/01/2014 CT  FINDINGS: 100 cc of Omnipaque 300 head was instilled into the bladder via the patient's Foley catheter using low gravity drip.  There is contrast extravasation from the anterior superior bladder into the anterior and left extraperitoneal space compatible with bladder leak. Extravasated contrast extends into the lower abdomen anterior to the left psoas muscle.  A tiny amount of high-density along the anterior mid pelvis (image 17) is indeterminate location with could lie intraperitoneal.  Postoperative changes with gas and fluid in the anterior lower pelvis is noted.  A percutaneous drain within the pelvis is identified as well as a Foley catheter within the bladder.  IMPRESSION: Extraperitoneal  bladder leak from the anterior superior bladder. A tiny amount of high density along the anterior mid pelvis is equivocal for extravasated intraperitoneal contrast.  Critical Value/emergent results were called by telephone at the time of  interpretation on 02/20/2014 at 3:37 PM to Dr. Lowella Bandy , who verbally acknowledged these results.   Electronically Signed   By: Hassan Rowan M.D.   On: 02/20/2014 15:38    Anti-infectives: Anti-infectives   Start     Dose/Rate Route Frequency Ordered Stop   02/21/14 1000  sulfamethoxazole-trimethoprim (BACTRIM DS) 800-160 MG per tablet 1 tablet     1 tablet Oral Every 12 hours 02/21/14 0723     02/17/14 1027  gentamicin (GARAMYCIN) 420 mg in dextrose 5 % 100 mL IVPB     420 mg 221 mL/hr over 30 Minutes Intravenous 30 min pre-op 02/17/14 1027 02/17/14 1422   02/17/14 1027  ceFAZolin (ANCEF) IVPB 2 g/50 mL premix     2 g 100 mL/hr over 30 Minutes Intravenous 30 min pre-op 02/17/14 1027 02/17/14 1342      Assessment/Plan:  1 - High-Grade Bladder Dome Cancer - path pending and will dictate further management.   2 - Urine Leak - much improved with JP off suction, now >80% output foley. Continue with this mode for now. Pt has good understanding of issue and need to keep foley bag below level of bladder / abdomen.  Cr stable. Begin bid bactrim proph.   3 -Disposition - [facility] in house for JP / foley output monitoring, likely DC tomorrow if outputs remain favorable. BMP again tomorrow AM.   Alexis Frock 02/21/2014

## 2014-02-22 LAB — BASIC METABOLIC PANEL
BUN: 28 mg/dL — ABNORMAL HIGH (ref 6–23)
CALCIUM: 9.8 mg/dL (ref 8.4–10.5)
CO2: 26 meq/L (ref 19–32)
Chloride: 95 mEq/L — ABNORMAL LOW (ref 96–112)
Creatinine, Ser: 1.59 mg/dL — ABNORMAL HIGH (ref 0.50–1.35)
GFR calc Af Amer: 50 mL/min — ABNORMAL LOW (ref >90)
GFR calc non Af Amer: 43 mL/min — ABNORMAL LOW (ref >90)
GLUCOSE: 98 mg/dL (ref 70–99)
Potassium: 4.4 mEq/L (ref 3.7–5.3)
SODIUM: 134 meq/L — AB (ref 137–147)

## 2014-02-22 MED ORDER — SULFAMETHOXAZOLE-TMP DS 800-160 MG PO TABS: 1.0000 | ORAL_TABLET | Freq: Every day | ORAL | Status: DC

## 2014-02-22 MED ORDER — OXYBUTYNIN CHLORIDE 5 MG PO TABS: 5.0000 mg | ORAL_TABLET | Freq: Three times a day (TID) | ORAL | Status: DC | PRN

## 2014-02-22 MED ORDER — OXYCODONE-ACETAMINOPHEN 5-325 MG PO TABS: 1.0000 | ORAL_TABLET | Freq: Four times a day (QID) | ORAL | Status: DC | PRN

## 2014-02-22 MED ORDER — SENNOSIDES-DOCUSATE SODIUM 8.6-50 MG PO TABS: 1.0000 | ORAL_TABLET | Freq: Two times a day (BID) | ORAL | Status: DC

## 2014-02-22 NOTE — Discharge Summary (Signed)
Physician Discharge Summary  Patient ID: Dustin Jordan MRN: [id number] DOB/AGE: Aug 28, 1946 68 y.o.  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   2 - Urine Leak - pt with high JP output post-op, up to 50% of urine output with corresponding rise in Cr c/w systemic resorption. CT Cystogram 5/25 confirms small anterior dome leak at site of bladder repair, small volume, extra-peritoneal. JP taken off suction 5/25 with resolution of leak with 800cc foley / 10cc JP 24 hrs prior to discharge therefore JP removed. Serum Cr at time of discharge also near normal back to <1.6 suggesting gross resolution of leak.  In summary Pt's post-op stay relatively uneventful and will be discharged with foley in place with plan for office cystogram prior to removal.        Consults: None  Significant Diagnostic Studies: labs: as per above  Treatments: IV hydration, antibiotics: bactrim and surgery: robotic partial cystectomy with cysto / ICG injection and pelvic lymphadenectomy 02/17/2014   Discharge Exam: Blood pressure 110/63, pulse 75, temperature 98 F (36.7 C), temperature source Oral, resp. rate 18, height 6\' 3"  (1.905 m), weight 81.647 kg (180 lb), SpO2 100.00%. General appearance: alert, cooperative and appears stated age Nose: Nares normal. Septum midline. Mucosa normal. No drainage or sinus tenderness. Throat: lips, mucosa, and tongue normal; teeth and gums normal Neck: supple, symmetrical, trachea midline Back: symmetric, no curvature. ROM normal. No CVA tenderness. Resp: non  labored Chest wall: no tenderness Cardio: regular rate and rhythm, S1, S2 normal, no murmur, click, rub or gallop GI: soft, non-tender; bowel sounds normal; no masses,  no organomegaly Male genitalia: normal, foley c/d/i with clear yellow urine Extremities: extremities normal, atraumatic, no cyanosis or edema Pulses: 2+ and symmetric Skin: Skin color, texture, turgor normal. No rashes or lesions Lymph nodes: Cervical, supraclavicular, and axillary nodes normal. Neurologic: Grossly normal Incision/Wound: Recent port and extraction sites c/d/i, no hernias. JP with scan fluid on / off suction, therefore removed and dry dressing applied.   Disposition: 01-Home or Self Care     Medication List    STOP taking these medications       BC HEADACHE POWDER PO      TAKE these medications       acetaminophen 500 MG tablet  Commonly known as:  TYLENOL  Take 500 mg by mouth every 6 (six) hours as needed for moderate pain.     hydrochlorothiazide 12.5 MG capsule  Commonly known as:  MICROZIDE  Take 12.5 mg by mouth every morning.     MAGNESIA PO  Take 1 tablet by mouth daily as needed. Pt only takes when the dr tells him his magnesia levels are low.     oxybutynin 5 MG tablet  Commonly known as:  DITROPAN  Take 1 tablet (5 mg total) by mouth every 8 (eight) hours as needed for bladder spasms. While catheter in place     oxyCODONE-acetaminophen 5-325 MG per tablet  Commonly known as:  ROXICET  Take 1-2 tablets by mouth every 6 (six) hours as needed for moderate pain or severe pain. Post-operatively     senna-docusate 8.6-50 MG per  tablet  Commonly known as:  Senokot-S  Take 1 tablet by mouth 2 (two) times daily. To prevent constipation while taking pain meds.     sulfamethoxazole-trimethoprim 800-160 MG per tablet  Commonly known as:  BACTRIM DS  Take 1 tablet by mouth daily. X 2 weeks to prevent post-op infection           Follow-up Information   Follow up with Alexis Frock, MD On 02/27/2014. (at 11:30)    Specialty:  Urology   Contact information:   [place] [address] [phone]       Signed: Alexis Frock 02/22/2014, 6:41 AM

## 2014-03-22 ENCOUNTER — Ambulatory Visit (INDEPENDENT_AMBULATORY_CARE_PROVIDER_SITE_OTHER): Payer: Medicare Other

## 2014-03-22 ENCOUNTER — Ambulatory Visit (INDEPENDENT_AMBULATORY_CARE_PROVIDER_SITE_OTHER): Payer: Medicare Other | Admitting: Emergency Medicine

## 2014-03-22 VITALS — BP 130/70 | HR 53 | Temp 97.8°F | Resp 18 | Ht 72.5 in | Wt 184.0 lb

## 2014-03-22 DIAGNOSIS — R200 Anesthesia of skin: Secondary | ICD-10-CM

## 2014-03-22 DIAGNOSIS — R202 Paresthesia of skin: Principal | ICD-10-CM

## 2014-03-22 DIAGNOSIS — R209 Unspecified disturbances of skin sensation: Secondary | ICD-10-CM

## 2014-03-22 MED ORDER — PREDNISONE 10 MG PO TABS: ORAL_TABLET | ORAL | Status: DC

## 2014-03-22 NOTE — Progress Notes (Signed)
   Subjective:    Patient ID: Dustin Jordan, male    DOB: 14-Nov-1945, 68 y.o.   MRN: [id number]  HPI Pt presents with numbness and tingling in right hand. He denies weakness. He denies injury. He has a history of bladder CA. He denies any pain in his neck.    Review of Systems     Objective:   Physical Exam there is a positive Tinel sign over the ulnar nerve at the elbow. There is a negative hamate tenderness. There is no weakness noted of the hand. There is numbness in the ulnar nerve distribution to the finger and ulnar side of the fourth finger  [facility] reading (PRIMARY) by  Dr.Daub moderate arthritic changes of the hand..       Assessment & Plan:  Patient has ulnar neuropathy of the right hand. Referral made to Dr. Amedeo Plenty  for his evaluation. We'll treat with a short taper of prednisone.

## 2014-04-25 ENCOUNTER — Ambulatory Visit (INDEPENDENT_AMBULATORY_CARE_PROVIDER_SITE_OTHER): Payer: Medicare Other | Admitting: Internal Medicine

## 2014-04-25 VITALS — BP 130/72 | HR 60 | Temp 98.0°F | Resp 16 | Ht 73.5 in | Wt 190.6 lb

## 2014-04-25 DIAGNOSIS — C671 Malignant neoplasm of dome of bladder: Secondary | ICD-10-CM

## 2014-04-25 DIAGNOSIS — M5137 Other intervertebral disc degeneration, lumbosacral region: Secondary | ICD-10-CM

## 2014-04-25 DIAGNOSIS — Z202 Contact with and (suspected) exposure to infections with a predominantly sexual mode of transmission: Secondary | ICD-10-CM

## 2014-04-25 DIAGNOSIS — Z0000 Encounter for general adult medical examination without abnormal findings: Secondary | ICD-10-CM

## 2014-04-25 DIAGNOSIS — M5136 Other intervertebral disc degeneration, lumbar region: Secondary | ICD-10-CM

## 2014-04-25 DIAGNOSIS — I10 Essential (primary) hypertension: Secondary | ICD-10-CM

## 2014-04-25 MED ORDER — NAPROXEN 500 MG PO TABS: 500.0000 mg | ORAL_TABLET | Freq: Two times a day (BID) | ORAL | Status: DC

## 2014-04-25 MED ORDER — HYDROCHLOROTHIAZIDE 12.5 MG PO CAPS: 12.5000 mg | ORAL_CAPSULE | Freq: Every morning | ORAL | Status: DC

## 2014-04-25 NOTE — Progress Notes (Signed)
Subjective:  This chart was scribed for Tami Lin, MD by Donato Schultz, Medical Scribe. This patient was seen in Room 4 and the patient's care was started at 5:46 PM.   Patient ID: Dustin Jordan, male    DOB: 02-Aug-1946, 68 y.o.   MRN: [id number]  HPI HPI Comments: Dustin Jordan is a 68 y.o. male with a history of hypertension and bladder cancer who presents to the [facility] for an annual exam.    He was advised to undergo chemotherapy but declined even after another tumor was found in the upper part of his bladder.  He is just going to be patient and see if the condition resolves itself.  His prostate was normal at his last visit with Dr. Lorelei Pont a year ago.  After his bladder surgery he was hospitalized for some kidney problems.  He has been diagnosed with degenerative disc disease and would like to have a prescription for Naproxen to treat his back pain.  His back pain will move from one location to another  He does not want to receive any immunizations.  He stopped smoking 3 years ago.  He is working part-time as a Geophysicist/field seismologist and enjoys being active.    Past Medical History  Diagnosis Date  . Hypertension   . White coat hypertension   . Swelling of right knee joint     s/p arthroscopy 06-08-2013  . Bladder cancer   . Hematuria   . Frequency of urination     -  Former smoker quit 3 yr ago---note Ct last fall remark lungs clear  Past Surgical History  Procedure Laterality Date  . Knee arthroscopy Right 06-08-2013  . Cystoscopy w/ retrogrades Bilateral 06/22/2013    Procedure: CYSTOSCOPY WITH RETROGRADE PYELOGRAM;  Surgeon: Alexis Frock, MD;  Location: [facility];  Service: Urology;  Laterality: Bilateral;  . Transurethral resection of bladder tumor with gyrus (turbt-gyrus) N/A 06/22/2013    Procedure: TRANSURETHRAL RESECTION OF BLADDER TUMOR WITH GYRUS (TURBT-GYRUS);  Surgeon: Alexis Frock, MD;  Location: [facility];   Service: Urology;  Laterality: N/A;  . Cystoscopy with retrograde pyelogram, ureteroscopy and stent placement Bilateral 01/02/2014    Procedure: CYSTOSCOPY WITH RETROGRADE PYELOGRAM BILATERAL URETERS  AND TRANSURETHRAL RESECTION OF A BLADDER TUMOR WITH MULTIPLE BX;  Surgeon: Alexis Frock, MD;  Location: [facility];  Service: Urology;  Laterality: Bilateral;  . Robot assisted laparoscopic complete cystect ileal conduit N/A 02/17/2014    Procedure: ROBOTIC ASSISTED LAPAROSCOPIC PARTIAL CYSTECTOMY;  Surgeon: Alexis Frock, MD;  Location: [facility];  Service: Urology;  Laterality: N/A;  . Lymphadenectomy Bilateral 02/17/2014    Procedure: BILATERAL LYMPH NODE DISSECTION;  Surgeon: Alexis Frock, MD;  Location: [facility];  Service: Urology;  Laterality: Bilateral;  . Cystoscopy N/A 02/17/2014    Procedure: CYSTOSCOPY WITH INDOCYANINE GREEN DYE;  Surgeon: Alexis Frock, MD;  Location: [facility];  Service: Urology;  Laterality: N/A;   History reviewed. No pertinent family history. History   Social History  . Marital Status: Single    Spouse Name: N/A    Number of Children: N/A  . Years of Education: N/A   Occupational History  . Driver      part time   Social History Main Topics  . Smoking status: Former Smoker -- 1.00 packs/day for 20 years    Types: Cigarettes    Quit date: 06/15/2010  . Smokeless tobacco: Never Used  . Alcohol Use: No  . Drug Use:  No  . Sexual Activity: Not on file   Other Topics Concern  . Not on file   Social History Narrative  . No narrative on file   No Known Allergies  Review of Systems   Objective:  Physical Exam  Nursing note and vitals reviewed. Constitutional: He is oriented to person, place, and time. He appears well-developed and well-nourished.  HENT:  Head: Normocephalic and atraumatic.  Right Ear: External ear normal.  Left Ear: External ear normal.  Nose: Nose normal.  Mouth/Throat: Oropharynx is clear and moist. No oropharyngeal exudate.  Tms and  canals clear  Eyes: Conjunctivae and EOM are normal. Pupils are equal, round, and reactive to light.  Neck: Normal range of motion. Neck supple. No thyromegaly present.  Cardiovascular: Normal rate, regular rhythm, normal heart sounds and intact distal pulses.  Exam reveals no gallop and no friction rub.   No murmur heard. Pulmonary/Chest: Effort normal and breath sounds normal. No respiratory distress. He has no wheezes. He has no rales.  Abdominal: Soft. Bowel sounds are normal. He exhibits no distension and no mass. There is no tenderness. There is no rebound and no guarding.  No hepatosplenomegaly  Musculoskeletal: Normal range of motion. He exhibits no edema and no tenderness.  Lymphadenopathy:    He has no cervical adenopathy.  Neurological: He is alert and oriented to person, place, and time. He has normal reflexes. No cranial nerve deficit. He exhibits normal muscle tone. Coordination normal.  Skin: Skin is warm and dry. No rash noted.  Psychiatric: He has a normal mood and affect. His behavior is normal. Judgment and thought content normal.     BP 130/72  Pulse 60  Temp(Src) 98 F (36.7 C) (Oral)  Resp 16  Ht 6' 1.5" (1.867 m)  Wt 190 lb 9.6 oz (86.456 kg)  BMI 24.80 kg/m2  SpO2 99% Assessment & Plan:    I have completed the patient encounter in its entirety as documented by the scribe, with editing by me where necessary. Rami Budhu P. Laney Pastor, M.D. Routine general medical examination at a health care facility - Plan:  CBC with Differential, Lipid panel, Comprehensive metabolic panel, PSA,   Unspecified essential hypertension  microzide Malignant neoplasm of dome of urinary bladder  F/u urol Exposure to STD - Plan: RPR, HIV antibody  DDD (degenerative disc disease), lumbar  Naproxen plus exercises  Meds ordered this encounter  Medications  . naproxen (NAPROSYN) 500 MG tablet    Sig: Take 1 tablet (500 mg total) by mouth 2 (two) times daily with a meal. Prn     Dispense:  30 tablet    Refill:  5  . hydrochlorothiazide (MICROZIDE) 12.5 MG capsule    Sig: Take 1 capsule (12.5 mg total) by mouth every morning.    Dispense:  90 capsule    Refill:  3

## 2014-04-26 LAB — LIPID PANEL
CHOL/HDL RATIO: 2.8 ratio
Cholesterol: 182 mg/dL (ref 0–200)
HDL: 65 mg/dL (ref >39)
LDL CALC: 94 mg/dL (ref 0–99)
TRIGLYCERIDES: 114 mg/dL (ref <150)
VLDL: 23 mg/dL (ref 0–40)

## 2014-04-26 LAB — RPR

## 2014-04-26 LAB — HIV ANTIBODY (ROUTINE TESTING W REFLEX): HIV 1&2 Ab, 4th Generation: NONREACTIVE

## 2014-04-26 LAB — COMPREHENSIVE METABOLIC PANEL
ALK PHOS: 81 U/L (ref 39–117)
ALT: 20 U/L (ref 0–53)
AST: 22 U/L (ref 0–37)
Albumin: 4 g/dL (ref 3.5–5.2)
BILIRUBIN TOTAL: 0.3 mg/dL (ref 0.2–1.2)
BUN: 20 mg/dL (ref 6–23)
CO2: 25 mEq/L (ref 19–32)
CREATININE: 1.22 mg/dL (ref 0.50–1.35)
Calcium: 9.4 mg/dL (ref 8.4–10.5)
Chloride: 106 mEq/L (ref 96–112)
Glucose, Bld: 90 mg/dL (ref 70–99)
Potassium: 4.5 mEq/L (ref 3.5–5.3)
SODIUM: 137 meq/L (ref 135–145)
TOTAL PROTEIN: 7.6 g/dL (ref 6.0–8.3)

## 2014-04-26 LAB — CBC WITH DIFFERENTIAL/PLATELET
Basophils Absolute: 0.1 10*3/uL (ref 0.0–0.1)
Basophils Relative: 1 % (ref 0–1)
Eosinophils Absolute: 0.1 10*3/uL (ref 0.0–0.7)
Eosinophils Relative: 2 % (ref 0–5)
HEMATOCRIT: 36.4 % — AB (ref 39.0–52.0)
HEMOGLOBIN: 11.8 g/dL — AB (ref 13.0–17.0)
LYMPHS ABS: 2.5 10*3/uL (ref 0.7–4.0)
LYMPHS PCT: 37 % (ref 12–46)
MCH: 24.5 pg — ABNORMAL LOW (ref 26.0–34.0)
MCHC: 32.4 g/dL (ref 30.0–36.0)
MCV: 75.7 fL — ABNORMAL LOW (ref 78.0–100.0)
MONO ABS: 0.6 10*3/uL (ref 0.1–1.0)
MONOS PCT: 9 % (ref 3–12)
NEUTROS ABS: 3.5 10*3/uL (ref 1.7–7.7)
Neutrophils Relative %: 51 % (ref 43–77)
Platelets: 278 10*3/uL (ref 150–400)
RBC: 4.81 MIL/uL (ref 4.22–5.81)
RDW: 15.2 % (ref 11.5–15.5)
WBC: 6.8 10*3/uL (ref 4.0–10.5)

## 2014-04-27 LAB — PSA: PSA: 0.59 ng/mL (ref <=4.00)

## 2014-04-28 ENCOUNTER — Encounter: Payer: Self-pay | Admitting: Internal Medicine

## 2014-05-04 ENCOUNTER — Telehealth: Payer: Self-pay

## 2014-05-04 NOTE — Telephone Encounter (Signed)
Pt needs copy of his medical records from 03-22-14 and 04-25-14.  I told him he would have to fill out the authorization form when he got here to pick them up and that Medical Records would call him when they are ready. [phone]

## 2014-07-14 ENCOUNTER — Other Ambulatory Visit: Payer: Self-pay

## 2014-09-18 ENCOUNTER — Ambulatory Visit (INDEPENDENT_AMBULATORY_CARE_PROVIDER_SITE_OTHER): Payer: Medicare Other | Admitting: Family Medicine

## 2014-09-18 VITALS — BP 175/90 | HR 60 | Temp 97.4°F | Resp 16 | Ht 73.0 in | Wt 202.0 lb

## 2014-09-18 DIAGNOSIS — R350 Frequency of micturition: Secondary | ICD-10-CM

## 2014-09-18 DIAGNOSIS — I10 Essential (primary) hypertension: Secondary | ICD-10-CM

## 2014-09-18 DIAGNOSIS — M545 Low back pain, unspecified: Secondary | ICD-10-CM

## 2014-09-18 LAB — COMPREHENSIVE METABOLIC PANEL
ALT: 21 U/L (ref 0–53)
AST: 26 U/L (ref 0–37)
Albumin: 4.3 g/dL (ref 3.5–5.2)
Alkaline Phosphatase: 81 U/L (ref 39–117)
BUN: 18 mg/dL (ref 6–23)
CO2: 27 mEq/L (ref 19–32)
Calcium: 10 mg/dL (ref 8.4–10.5)
Chloride: 102 mEq/L (ref 96–112)
Creat: 1.04 mg/dL (ref 0.50–1.35)
Glucose, Bld: 78 mg/dL (ref 70–99)
Potassium: 4 mEq/L (ref 3.5–5.3)
Sodium: 137 mEq/L (ref 135–145)
TOTAL PROTEIN: 8.3 g/dL (ref 6.0–8.3)
Total Bilirubin: 0.4 mg/dL (ref 0.2–1.2)

## 2014-09-18 LAB — POCT UA - MICROSCOPIC ONLY
Bacteria, U Microscopic: NEGATIVE
Casts, Ur, LPF, POC: NEGATIVE
Crystals, Ur, HPF, POC: NEGATIVE
Epithelial cells, urine per micros: NEGATIVE
Mucus, UA: NEGATIVE
WBC, UR, HPF, POC: NEGATIVE
Yeast, UA: NEGATIVE

## 2014-09-18 LAB — POCT URINALYSIS DIPSTICK
BILIRUBIN UA: NEGATIVE
GLUCOSE UA: NEGATIVE
Ketones, UA: NEGATIVE
LEUKOCYTES UA: NEGATIVE
Nitrite, UA: NEGATIVE
Protein, UA: NEGATIVE
Spec Grav, UA: 1.02
Urobilinogen, UA: 0.2
pH, UA: 5.5

## 2014-09-18 MED ORDER — TAMSULOSIN HCL 0.4 MG PO CAPS: 0.4000 mg | ORAL_CAPSULE | Freq: Every day | ORAL | Status: DC

## 2014-09-18 MED ORDER — METHOCARBAMOL 500 MG PO TABS: 500.0000 mg | ORAL_TABLET | Freq: Three times a day (TID) | ORAL | Status: DC | PRN

## 2014-09-18 NOTE — Progress Notes (Signed)
[facility] [address], [place] [address] [phone]  Date:  09/18/2014   Name:  Dustin Jordan   DOB:  Jul 23, 1946   MRN:  [id number]  PCP:  Leandrew Koyanagi, MD    Chief Complaint: Back Pain and other   History of Present Illness:  Dustin HENNEMAN is a 68 y.o. very pleasant male patient who presents with the following:  A few years ago he was dx with a degenerative disc in his back.  He has noted some pain off an on.  His back has been bothering him more down into his buttocks over the last 5-6 days. NKI.  No pain down his legs.  The pain is more on the right side.  Never had any surgery on his back. No leg numbness or weakness, no bowel or bladder control issues  He takes his BP medication "maybe 3x a week."  He just forgets or does not feel like taking it   He notes more frequent urination over the last 6-8 weeks.  No pain with urination.   No blood in his urine.  He wonders if he has BPH Dr. [name] is his urologist- he managed his bladder cancer.  This is thought to be cured but he still follows up regularly.    BP Readings from Last 3 Encounters:  09/18/14 172/80  04/25/14 130/72  03/22/14 130/70     Patient Active Problem List   Diagnosis Date Noted  . Clot retention of urine 01/01/2014  . Bladder cancer 07/26/2013  . HTN (hypertension) 04/20/2013  . Hematuria 04/20/2013    Past Medical History  Diagnosis Date  . Hypertension   . White coat hypertension   . Swelling of right knee joint     s/p arthroscopy 06-08-2013  . Bladder cancer   . Hematuria   . Frequency of urination     Past Surgical History  Procedure Laterality Date  . Knee arthroscopy Right 06-08-2013  . Cystoscopy w/ retrogrades Bilateral 06/22/2013    Procedure: CYSTOSCOPY WITH RETROGRADE PYELOGRAM;  Surgeon: Alexis Frock, MD;  Location: [facility];  Service: Urology;  Laterality: Bilateral;  . Transurethral resection of bladder tumor with  gyrus (turbt-gyrus) N/A 06/22/2013    Procedure: TRANSURETHRAL RESECTION OF BLADDER TUMOR WITH GYRUS (TURBT-GYRUS);  Surgeon: Alexis Frock, MD;  Location: [facility];  Service: Urology;  Laterality: N/A;  . Cystoscopy with retrograde pyelogram, ureteroscopy and stent placement Bilateral 01/02/2014    Procedure: CYSTOSCOPY WITH RETROGRADE PYELOGRAM BILATERAL URETERS  AND TRANSURETHRAL RESECTION OF A BLADDER TUMOR WITH MULTIPLE BX;  Surgeon: Alexis Frock, MD;  Location: [facility];  Service: Urology;  Laterality: Bilateral;  . Robot assisted laparoscopic complete cystect ileal conduit N/A 02/17/2014    Procedure: ROBOTIC ASSISTED LAPAROSCOPIC PARTIAL CYSTECTOMY;  Surgeon: Alexis Frock, MD;  Location: [facility];  Service: Urology;  Laterality: N/A;  . Lymphadenectomy Bilateral 02/17/2014    Procedure: BILATERAL LYMPH NODE DISSECTION;  Surgeon: Alexis Frock, MD;  Location: [facility];  Service: Urology;  Laterality: Bilateral;  . Cystoscopy N/A 02/17/2014    Procedure: CYSTOSCOPY WITH INDOCYANINE GREEN DYE;  Surgeon: Alexis Frock, MD;  Location: [facility];  Service: Urology;  Laterality: N/A;    History  Substance Use Topics  . Smoking status: Former Smoker -- 1.00 packs/day for 20 years    Types: Cigarettes    Quit date: 06/15/2010  . Smokeless tobacco: Never Used  . Alcohol Use: No  History reviewed. No pertinent family history.  No Known Allergies  Medication list has been reviewed and updated.  Current Outpatient Prescriptions on File Prior to Visit  Medication Sig Dispense Refill  . hydrochlorothiazide (MICROZIDE) 12.5 MG capsule Take 1 capsule (12.5 mg total) by mouth every morning. 90 capsule 3  . naproxen (NAPROSYN) 500 MG tablet Take 1 tablet (500 mg total) by mouth 2 (two) times daily with a meal. Prn 30 tablet 5   No current facility-administered medications on file prior to visit.    Review of Systems:  As per HPI- otherwise negative.   Physical  Examination: Filed Vitals:   09/18/14 1330  BP: 172/80  Pulse: 60  Temp: 97.4 F (36.3 C)  Resp: 16   Filed Vitals:   09/18/14 1330  Height: 6\' 1"  (1.854 m)  Weight: 202 lb (91.627 kg)   Body mass index is 26.66 kg/(m^2). Ideal Body Weight: Weight in (lb) to have BMI = 25: 189.1  GEN: WDWN, NAD, Non-toxic, A & O x 3, looks well HEENT: Atraumatic, Normocephalic. Neck supple. No masses, No LAD. Ears and Nose: No external deformity. CV: RRR, No M/G/R. No JVD. No thrill. No extra heart sounds. PULM: CTA B, no wheezes, crackles, rhonchi. No retractions. No resp. distress. No accessory muscle use. ABD: S, NT, ND, +BS. No rebound. No HSM. EXTR: No c/c/e NEURO Normal gait.  PSYCH: Normally interactive. Conversant. Not depressed or anxious appearing.  Calm demeanor.  DTR: enlarged but smooth and non- tender prostate Back: mild tenderness in the muscles of the right sided lower back.  No bony TTP.  Normal lumbar flexion and extension, normal BLE strength, sensation and DTR.   Results for orders placed or performed in visit on 09/18/14  POCT UA - Microscopic Only  Result Value Ref Range   WBC, Ur, HPF, POC neg    RBC, urine, microscopic 0-1    Bacteria, U Microscopic neg    Mucus, UA neg    Epithelial cells, urine per micros neg    Crystals, Ur, HPF, POC neg    Casts, Ur, LPF, POC neg    Yeast, UA neg   POCT urinalysis dipstick  Result Value Ref Range   Color, UA yellow    Clarity, UA clear    Glucose, UA neg    Bilirubin, UA neg    Ketones, UA neg    Spec Grav, UA 1.020    Blood, UA small    pH, UA 5.5    Protein, UA neg    Urobilinogen, UA 0.2    Nitrite, UA neg    Leukocytes, UA Negative    Assessment and Plan: Urinary frequency - Plan: PSA, Comprehensive metabolic panel, POCT UA - Microscopic Only, POCT urinalysis dipstick, Urine culture, tamsulosin (FLOMAX) 0.4 MG CAPS capsule  Midline low back pain without sciatica - Plan: methocarbamol (ROBAXIN) 500 MG  tablet  Essential hypertension  He is not taking his BP medication.  Discussed this with him- in consistent use of a diuretic may also increase his polyuria.  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  Continue to see Dr. [name] Will try tylenol (decrease NSAID use due to HTN) or robaxin for his back pain.    See patient instructions for more details.     Signed Lamar Blinks, MD

## 2014-09-18 NOTE — Patient Instructions (Addendum)
I would recommend that you take your BP medication regularly- this will make it work more consistently and will also help with your urinary symptoms.   Try the flomax for your urinary symptoms/ prostate enlargement For you back, try taking tylenol or robaxin (muscle relaxer) as needed.  The robaxin may cause you to feel a bit sleepy

## 2014-09-19 LAB — URINE CULTURE
Colony Count: NO GROWTH
Organism ID, Bacteria: NO GROWTH

## 2014-09-19 LAB — PSA: PSA: 0.74 ng/mL (ref <=4.00)

## 2014-10-02 ENCOUNTER — Telehealth: Payer: Self-pay

## 2014-10-02 NOTE — Telephone Encounter (Signed)
Pt called and left a voicemail. Could not understand what he was requesting, if anything. Returned pt's call and left message asking him to call back and clarify.

## 2014-10-04 ENCOUNTER — Ambulatory Visit (INDEPENDENT_AMBULATORY_CARE_PROVIDER_SITE_OTHER): Payer: Medicare Other | Admitting: Endocrinology

## 2014-10-04 ENCOUNTER — Encounter: Payer: Self-pay | Admitting: Endocrinology

## 2014-10-04 VITALS — BP 132/84 | HR 69 | Temp 97.9°F | Ht 73.0 in | Wt 204.0 lb

## 2014-10-04 DIAGNOSIS — E079 Disorder of thyroid, unspecified: Secondary | ICD-10-CM | POA: Diagnosis not present

## 2014-10-04 LAB — TSH: TSH: 1.78 u[IU]/mL (ref 0.35–4.50)

## 2014-10-04 NOTE — Progress Notes (Signed)
Subjective:    Patient ID: Dustin Jordan, male    DOB: 11/06/1945, 69 y.o.   MRN: [id number]  HPI In April of 2015, pt was noted to have slight swelling at the anterior neck, and assoc dysphagia (only with large pills).   Past Medical History  Diagnosis Date  . Hypertension   . White coat hypertension   . Swelling of right knee joint     s/p arthroscopy 06-08-2013  . Bladder cancer   . Hematuria   . Frequency of urination     Past Surgical History  Procedure Laterality Date  . Knee arthroscopy Right 06-08-2013  . Cystoscopy w/ retrogrades Bilateral 06/22/2013    Procedure: CYSTOSCOPY WITH RETROGRADE PYELOGRAM;  Surgeon: Alexis Frock, MD;  Location: [facility];  Service: Urology;  Laterality: Bilateral;  . Transurethral resection of bladder tumor with gyrus (turbt-gyrus) N/A 06/22/2013    Procedure: TRANSURETHRAL RESECTION OF BLADDER TUMOR WITH GYRUS (TURBT-GYRUS);  Surgeon: Alexis Frock, MD;  Location: [facility];  Service: Urology;  Laterality: N/A;  . Cystoscopy with retrograde pyelogram, ureteroscopy and stent placement Bilateral 01/02/2014    Procedure: CYSTOSCOPY WITH RETROGRADE PYELOGRAM BILATERAL URETERS  AND TRANSURETHRAL RESECTION OF A BLADDER TUMOR WITH MULTIPLE BX;  Surgeon: Alexis Frock, MD;  Location: [facility];  Service: Urology;  Laterality: Bilateral;  . Robot assisted laparoscopic complete cystect ileal conduit N/A 02/17/2014    Procedure: ROBOTIC ASSISTED LAPAROSCOPIC PARTIAL CYSTECTOMY;  Surgeon: Alexis Frock, MD;  Location: [facility];  Service: Urology;  Laterality: N/A;  . Lymphadenectomy Bilateral 02/17/2014    Procedure: BILATERAL LYMPH NODE DISSECTION;  Surgeon: Alexis Frock, MD;  Location: [facility];  Service: Urology;  Laterality: Bilateral;  . Cystoscopy N/A 02/17/2014    Procedure: CYSTOSCOPY WITH INDOCYANINE GREEN DYE;  Surgeon: Alexis Frock, MD;  Location: [facility];  Service: Urology;  Laterality: N/A;    History    Social History  . Marital Status: Single    Spouse Name: N/A    Number of Children: N/A  . Years of Education: N/A   Occupational History  . Driver      part time   Social History Main Topics  . Smoking status: Former Smoker -- 1.00 packs/day for 20 years    Types: Cigarettes    Quit date: 06/15/2010  . Smokeless tobacco: Never Used  . Alcohol Use: No  . Drug Use: No  . Sexual Activity: Not on file   Other Topics Concern  . Not on file   Social History Narrative    Current Outpatient Prescriptions on File Prior to Visit  Medication Sig Dispense Refill  . hydrochlorothiazide (MICROZIDE) 12.5 MG capsule Take 1 capsule (12.5 mg total) by mouth every morning. 90 capsule 3  . tamsulosin (FLOMAX) 0.4 MG CAPS capsule Take 1 capsule (0.4 mg total) by mouth daily. 30 capsule 3  . methocarbamol (ROBAXIN) 500 MG tablet Take 1 tablet (500 mg total) by mouth every 8 (eight) hours as needed. (Patient not taking: Reported on 10/04/2014) 30 tablet 0  . naproxen (NAPROSYN) 500 MG tablet Take 1 tablet (500 mg total) by mouth 2 (two) times daily with a meal. Prn (Patient not taking: Reported on 10/04/2014) 30 tablet 5   No current facility-administered medications on file prior to visit.    No Known Allergies  Family History  Problem Relation Age of Onset  . Thyroid disease Neg Hx     BP 132/84 mmHg  Pulse 69  Temp(Src) 97.9 F (  36.6 C) (Oral)  Ht 6\' 1"  (1.854 m)  Wt 204 lb (92.534 kg)  BMI 26.92 kg/m2  SpO2 96%    Review of Systems  Constitutional: Negative for unexpected weight change.  HENT: Negative for sore throat.   Respiratory: Negative for shortness of breath.   Cardiovascular: Negative for palpitations.  Gastrointestinal: Negative for vomiting.  Endocrine: Negative for cold intolerance.  Musculoskeletal: Negative for neck stiffness.  Skin: Negative for rash.  Allergic/Immunologic: Negative for environmental allergies.  Neurological: Negative for numbness.   Hematological: Negative for adenopathy.  Psychiatric/Behavioral: Negative for dysphoric mood.       Objective:   Physical Exam VS: see vs page GEN: no distress HEAD: head: no deformity eyes: no periorbital swelling, no proptosis external nose and ears are normal mouth: no lesion seen NECK: ? Of right thyroid mass.  CHEST WALL: no deformity LUNGS: clear to auscultation BREASTS:  No gynecomastia CV: reg rate and rhythm, no murmur ABD: abdomen is soft, nontender.  no hepatosplenomegaly.  not distended.  no hernia MUSCULOSKELETAL: muscle bulk and strength are grossly normal.  no obvious joint swelling.  gait is normal and steady.   EXTEMITIES: no deformity.  no edema.   NEURO:  cn 2-12 grossly intact.   readily moves all 4's.  slight tremor of the hands.   SKIN:  Normal texture and temperature.  Not diaphoretic.   NODES:  None palpable at the neck PSYCH: alert, well-oriented.  Does not appear anxious nor depressed.  Lab Results  Component Value Date   TSH 1.78 10/04/2014      Assessment & Plan:  Thyroid mass, new, uncertain etiology. Dysphagia, unlikely thyroid-related.    Patient is advised the following: Patient Instructions  blood tests are being requested for you today.   Let's check a thyroid ultrasound.  you will receive a phone call, about a day and time for an appointment.  If a lump is noticed there, you should have a biopsy.  It is easy.  Let's also check a swallowing x-ray.  We'll let you know about the results of each of these.

## 2014-10-04 NOTE — Patient Instructions (Addendum)
blood tests are being requested for you today.   Let's check a thyroid ultrasound.  you will receive a phone call, about a day and time for an appointment.  If a lump is noticed there, you should have a biopsy.  It is easy.  Let's also check a swallowing x-ray.  We'll let you know about the results of each of these.

## 2014-10-05 NOTE — Telephone Encounter (Signed)
Spoke with patient this morning. He states he needs records from his last visit with Dr. Lorelei Pont on Dec 21. Will pick up and sign release form when he gets here. Records will be ready today.

## 2014-10-06 ENCOUNTER — Telehealth: Payer: Self-pay | Admitting: Endocrinology

## 2014-10-06 NOTE — Telephone Encounter (Signed)
Pt advised that thyroid labs were normal.

## 2014-10-06 NOTE — Telephone Encounter (Signed)
Patient is calling for the results of his lab work. °

## 2014-10-10 ENCOUNTER — Ambulatory Visit
Admission: RE | Admit: 2014-10-10 | Discharge: 2014-10-10 | Disposition: A | Discharge status: Home / Self Care | Payer: Medicare Other | Source: Ambulatory Visit | Attending: Endocrinology | Admitting: Endocrinology

## 2014-10-10 ENCOUNTER — Other Ambulatory Visit: Payer: Self-pay | Admitting: Endocrinology

## 2014-10-10 DIAGNOSIS — E042 Nontoxic multinodular goiter: Secondary | ICD-10-CM | POA: Diagnosis not present

## 2014-10-10 DIAGNOSIS — E079 Disorder of thyroid, unspecified: Secondary | ICD-10-CM

## 2014-10-24 ENCOUNTER — Ambulatory Visit
Admission: RE | Admit: 2014-10-24 | Discharge: 2014-10-24 | Disposition: A | Discharge status: Home / Self Care | Payer: Medicare Other | Source: Ambulatory Visit | Attending: Endocrinology | Admitting: Endocrinology

## 2014-10-24 ENCOUNTER — Other Ambulatory Visit (HOSPITAL_COMMUNITY)
Admission: RE | Admit: 2014-10-24 | Discharge: 2014-10-24 | Disposition: A | Discharge status: Home / Self Care | Payer: Medicare Other | Source: Ambulatory Visit | Attending: Interventional Radiology | Admitting: Interventional Radiology

## 2014-10-24 DIAGNOSIS — E079 Disorder of thyroid, unspecified: Secondary | ICD-10-CM

## 2014-10-24 DIAGNOSIS — E041 Nontoxic single thyroid nodule: Secondary | ICD-10-CM | POA: Insufficient documentation

## 2014-11-07 ENCOUNTER — Telehealth: Payer: Self-pay

## 2014-11-07 NOTE — Telephone Encounter (Signed)
No cancer was seen Please come back for a follow-up appointment in 6 months. i'll let you know when i get the results of the swallowing x-ray

## 2014-11-07 NOTE — Telephone Encounter (Signed)
Pt advised of note below and voiced understanding.  

## 2014-11-07 NOTE — Telephone Encounter (Signed)
Pt called requesting the results of his Thyroid Biopsy from 10/24/2013. Are these results ready? Please advise, Thanks!

## 2014-11-14 ENCOUNTER — Other Ambulatory Visit: Payer: Self-pay | Admitting: Family Medicine

## 2014-11-16 NOTE — Telephone Encounter (Signed)
Dr Lorelei Pont do you want to give RFs on this or have pt RTC?

## 2014-12-04 ENCOUNTER — Ambulatory Visit (INDEPENDENT_AMBULATORY_CARE_PROVIDER_SITE_OTHER): Payer: Medicare Other | Admitting: Emergency Medicine

## 2014-12-04 VITALS — BP 138/90 | HR 66 | Temp 97.4°F | Resp 16 | Ht 73.5 in | Wt 200.6 lb

## 2014-12-04 DIAGNOSIS — M5430 Sciatica, unspecified side: Secondary | ICD-10-CM

## 2014-12-04 MED ORDER — TRAMADOL HCL 50 MG PO TABS: 50.0000 mg | ORAL_TABLET | Freq: Three times a day (TID) | ORAL | Status: DC | PRN

## 2014-12-04 MED ORDER — NAPROXEN SODIUM 550 MG PO TABS: 550.0000 mg | ORAL_TABLET | Freq: Two times a day (BID) | ORAL | Status: DC

## 2014-12-04 MED ORDER — CYCLOBENZAPRINE HCL 10 MG PO TABS: 10.0000 mg | ORAL_TABLET | Freq: Three times a day (TID) | ORAL | Status: DC | PRN

## 2014-12-04 NOTE — Patient Instructions (Signed)
Sciatica Sciatica is pain, weakness, numbness, or tingling along the path of the sciatic nerve. The nerve starts in the lower back and runs down the back of each leg. The nerve controls the muscles in the lower leg and in the back of the knee, while also providing sensation to the back of the thigh, lower leg, and the sole of your foot. Sciatica is a symptom of another medical condition. For instance, nerve damage or certain conditions, such as a herniated disk or bone spur on the spine, pinch or put pressure on the sciatic nerve. This causes the pain, weakness, or other sensations normally associated with sciatica. Generally, sciatica only affects one side of the body. CAUSES   Herniated or slipped disc.  Degenerative disk disease.  A pain disorder involving the narrow muscle in the buttocks (piriformis syndrome).  Pelvic injury or fracture.  Pregnancy.  Tumor (rare). SYMPTOMS  Symptoms can vary from mild to very severe. The symptoms usually travel from the low back to the buttocks and down the back of the leg. Symptoms can include:  Mild tingling or dull aches in the lower back, leg, or hip.  Numbness in the back of the calf or sole of the foot.  Burning sensations in the lower back, leg, or hip.  Sharp pains in the lower back, leg, or hip.  Leg weakness.  Severe back pain inhibiting movement. These symptoms may get worse with coughing, sneezing, laughing, or prolonged sitting or standing. Also, being overweight may worsen symptoms. DIAGNOSIS  Your caregiver will perform a physical exam to look for common symptoms of sciatica. He or she may ask you to do certain movements or activities that would trigger sciatic nerve pain. Other tests may be performed to find the cause of the sciatica. These may include:  Blood tests.  X-rays.  Imaging tests, such as an MRI or CT scan. TREATMENT  Treatment is directed at the cause of the sciatic pain. Sometimes, treatment is not necessary  and the pain and discomfort goes away on its own. If treatment is needed, your caregiver may suggest:  Over-the-counter medicines to relieve pain.  Prescription medicines, such as anti-inflammatory medicine, muscle relaxants, or narcotics.  Applying heat or ice to the painful area.  Steroid injections to lessen pain, irritation, and inflammation around the nerve.  Reducing activity during periods of pain.  Exercising and stretching to strengthen your abdomen and improve flexibility of your spine. Your caregiver may suggest losing weight if the extra weight makes the back pain worse.  Physical therapy.  Surgery to eliminate what is pressing or pinching the nerve, such as a bone spur or part of a herniated disk. HOME CARE INSTRUCTIONS   Only take over-the-counter or prescription medicines for pain or discomfort as directed by your caregiver.  Apply ice to the affected area for 20 minutes, 3-4 times a day for the first 48-72 hours. Then try heat in the same way.  Exercise, stretch, or perform your usual activities if these do not aggravate your pain.  Attend physical therapy sessions as directed by your caregiver.  Keep all follow-up appointments as directed by your caregiver.  Do not wear high heels or shoes that do not provide proper support.  Check your mattress to see if it is too soft. A firm mattress may lessen your pain and discomfort. SEEK IMMEDIATE MEDICAL CARE IF:   You lose control of your bowel or bladder (incontinence).  You have increasing weakness in the lower back, pelvis, buttocks,   or legs.  You have redness or swelling of your back.  You have a burning sensation when you urinate.  You have pain that gets worse when you lie down or awakens you at night.  Your pain is worse than you have experienced in the past.  Your pain is lasting longer than 4 weeks.  You are suddenly losing weight without reason. MAKE SURE YOU:  Understand these  instructions.  Will watch your condition.  Will get help right away if you are not doing well or get worse. Document Released: 09/09/2001 Document Revised: 03/16/2012 Document Reviewed: 01/25/2012 Exit[employer] Patient Information 2015 [employer]. This information is not intended to replace advice given to you by your health care provider. Make sure you discuss any questions you have with your health care provider.  

## 2014-12-04 NOTE — Progress Notes (Signed)
[facility] [address], [place] [address] [phone]  Date:  12/04/2014   Name:  Dustin Jordan   DOB:  12-02-1945   MRN:  [id number]  PCP:  Leandrew Koyanagi, MD    Chief Complaint: nerve pain   History of Present Illness:  Dustin Jordan is a 69 y.o. 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 Difficult historian.  Says my "sciatic nerve" is a problem and he needs an MRI Has pain in right buttock into the right leg to the mid calf Some weakness.  No numbness No history of recent injury or overuse Pain improved with standing  Worse with laying or sitting.  Worse with cough or sneeze No improvement with over the counter medications or other home remedies.  No improvement with over the counter medications or other home remedies.  Denies other complaint or health concern today.   Patient Active Problem List   Diagnosis Date Noted  . Thyroid mass 10/04/2014  . Clot retention of urine 01/01/2014  . Bladder cancer 07/26/2013  . HTN (hypertension) 04/20/2013  . Hematuria 04/20/2013    Past Medical History  Diagnosis Date  . Hypertension   . White coat hypertension   . Swelling of right knee joint     s/p arthroscopy 06-08-2013  . Bladder cancer   . Hematuria   . Frequency of urination     Past Surgical History  Procedure Laterality Date  . Knee arthroscopy Right 06-08-2013  . Cystoscopy w/ retrogrades Bilateral 06/22/2013    Procedure: CYSTOSCOPY WITH RETROGRADE PYELOGRAM;  Surgeon: Alexis Frock, MD;  Location[facility];  Service: Urology;  Laterality: Bilateral;  . Transurethral resection of bladder tumor with gyrus (turbt-gyrus) N/A 06/22/2013    Procedure: TRANSURETHRAL RESECTION OF BLADDER TUMOR WITH GYRUS (TURBT-GYRUS);  Surgeon: Alexis Frock, MD;  Location: [facility];  Service: Urology;  Laterality: N/A;  . Cystoscopy  with retrograde pyelogram, ureteroscopy and stent placement Bilateral 01/02/2014    Procedure: CYSTOSCOPY WITH RETROGRADE PYELOGRAM BILATERAL URETERS  AND TRANSURETHRAL RESECTION OF A BLADDER TUMOR WITH MULTIPLE BX;  Surgeon: Alexis Frock, MD;  Location: [facility];  Service: Urology;  Laterality: Bilateral;  . Robot assisted laparoscopic complete cystect ileal conduit N/A 02/17/2014    Procedure: ROBOTIC ASSISTED LAPAROSCOPIC PARTIAL CYSTECTOMY;  Surgeon: Alexis Frock, MD;  Location: [facility];  Service: Urology;  Laterality: N/A;  . Lymphadenectomy Bilateral 02/17/2014    Procedure: BILATERAL LYMPH NODE DISSECTION;  Surgeon: Alexis Frock, MD;  Location: [facility];  Service: Urology;  Laterality: Bilateral;  . Cystoscopy N/A 02/17/2014    Procedure: CYSTOSCOPY WITH INDOCYANINE GREEN DYE;  Surgeon: Alexis Frock, MD;  Location: [facility];  Service: Urology;  Laterality: N/A;    History  Substance Use Topics  . Smoking status: Former Smoker -- 1.00 packs/day for 20 years    Types: Cigarettes    Quit date: 06/15/2010  . Smokeless tobacco: Never Used  . Alcohol Use: No    Family History  Problem Relation Age of Onset  . Thyroid disease Neg Hx     No Known Allergies  Medication list has been reviewed and updated.  Current Outpatient Prescriptions on File Prior to Visit  Medication Sig Dispense Refill  . hydrochlorothiazide (MICROZIDE) 12.5 MG capsule Take 1 capsule (12.5 mg total) by mouth every morning. 90 capsule 3  . tamsulosin (  FLOMAX) 0.4 MG CAPS capsule Take 1 capsule (0.4 mg total) by mouth daily. 30 capsule 3  . methocarbamol (ROBAXIN) 500 MG tablet TAKE 1 TABLET BY MOUTH EVERY 8 HOURS AS NEEDED (Patient not taking: Reported on 12/04/2014) 30 tablet 0  . naproxen (NAPROSYN) 500 MG tablet Take 1 tablet (500 mg total) by mouth 2 (two) times daily with a meal. Prn (Patient not taking: Reported on 10/04/2014) 30 tablet 5   No current facility-administered medications on file prior to visit.     Review of Systems:  As per HPI, otherwise negative.    Physical Examination: Filed Vitals:   12/04/14 0927  BP: 138/90  Pulse: 66  Temp: 97.4 F (36.3 C)  Resp: 16   Filed Vitals:   12/04/14 0927  Height: 6' 1.5" (1.867 m)  Weight: 200 lb 9.6 oz (90.992 kg)   Body mass index is 26.1 kg/(m^2). Ideal Body Weight: Weight in (lb) to have BMI = 25: 191.7   GEN: WDWN, NAD, Non-toxic, Alert & Oriented x 3 HEENT: Atraumatic, Normocephalic.  Ears and Nose: No external deformity. EXTR: No clubbing/cyanosis/edema NEURO: Normal gait.  PSYCH: Normally interactive. Conversant. Not depressed or anxious appearing.  Calm demeanor.  BACK:  Tender right sciatic notch.  No muscle spasm or tenderness.  Motor intact  Assessment and Plan: Sciatic neuritis Anaprox Flexeril Ultram Local heat  Signed,  Ellison Carwin, MD

## 2014-12-18 ENCOUNTER — Encounter: Payer: Self-pay | Admitting: *Deleted

## 2014-12-27 ENCOUNTER — Encounter: Payer: Self-pay | Admitting: Internal Medicine

## 2014-12-27 ENCOUNTER — Ambulatory Visit (INDEPENDENT_AMBULATORY_CARE_PROVIDER_SITE_OTHER): Payer: Medicare Other

## 2014-12-27 ENCOUNTER — Ambulatory Visit (INDEPENDENT_AMBULATORY_CARE_PROVIDER_SITE_OTHER): Payer: Medicare Other | Admitting: Internal Medicine

## 2014-12-27 VITALS — BP 170/97 | HR 60 | Temp 97.7°F | Resp 16 | Ht 72.5 in | Wt 195.0 lb

## 2014-12-27 DIAGNOSIS — M25551 Pain in right hip: Secondary | ICD-10-CM | POA: Diagnosis not present

## 2014-12-27 DIAGNOSIS — M5136 Other intervertebral disc degeneration, lumbar region: Secondary | ICD-10-CM

## 2014-12-27 DIAGNOSIS — I10 Essential (primary) hypertension: Secondary | ICD-10-CM

## 2014-12-27 MED ORDER — MELOXICAM 15 MG PO TABS: 15.0000 mg | ORAL_TABLET | Freq: Every day | ORAL | Status: DC

## 2014-12-27 NOTE — Progress Notes (Signed)
   Subjective:    Patient ID: Dustin Jordan, male    DOB: 1946/04/27, 69 y.o.   MRN: [id number]  HPI  Chief Complaint  Patient presents with  . Hip Pain    right    -  loss of control of blood pressure  Over the past 3-4 months he has developed pain in his right hip when walking and after sitting for a while He thought this might be related to his degenerative back disease which had a flare after he did sit ups, but there is no clear pattern suggesting this No peripheral weakness or numbness This pain is not radicular  He was started on NSAIDs-Anaprox one month ago and his blood pressure has been out-of-control ever since He is normally easy control on his current medications-Microzide 12.5 mg  Patient Active Problem List   Diagnosis Date Noted  . 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 Clot retention of urine 01/01/2014  . Bladder cancer 07/26/2013  . HTN (hypertension) 04/20/2013  . Hematuria 04/20/2013     Review of Systems No weight loss No fever or night sweats   no chest pain or palpitations No abdominal pain No dysuria frequency  Nocturia No paresthesias or weakness in the extremities  Objective:   Physical Exam BP 170/97 mmHg  Pulse 60  Temp(Src) 97.7 F (36.5 C)  Resp 16  Ht 6' 0.5" (1.842 m)  Wt 195 lb (88.451 kg)  BMI 26.07 kg/m2  SpO2 98% HEENT clear Heart regular Extremities with good pulses and no edema He is not very tender in the lumbar area to palpation Straight leg raise does not produce lumbar pain The right hip lacks external rotation compared to the left by significant amount Internal rotation of the right hip produces discomfort as does external rotation No sensory or motor losses  [facility] reading (PRIMARY) by  Dr. 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 Belia Heman If blood pressure returns to normal after discontinuing NSAIDs he may try meloxicam following his blood pressure for any problems

## 2014-12-29 ENCOUNTER — Telehealth: Payer: Self-pay

## 2014-12-29 NOTE — Telephone Encounter (Signed)
Patient called and told that his x-ray disc is ready for pick up at the [facility] in the pick up draw.

## 2015-01-04 ENCOUNTER — Telehealth: Payer: Self-pay

## 2015-01-04 ENCOUNTER — Other Ambulatory Visit: Payer: Self-pay | Admitting: Radiology

## 2015-01-04 MED ORDER — DICLOFENAC SODIUM 1 % TD GEL: TRANSDERMAL | Status: DC

## 2015-01-04 NOTE — Telephone Encounter (Signed)
Notified pt on VM that a topical gel has been sent into pharm.

## 2015-01-04 NOTE — Telephone Encounter (Signed)
Pt has been taking meloxicam, but it is making him constipated. The naprosyn made his bp go up. He would like to know if he could have a cream. Please advise.

## 2015-01-04 NOTE — Telephone Encounter (Signed)
Dr Laney Pastor, I sent in w/1 RF as you did on pt's meloxicam at [place]. Please let me know if this is not OK or if you wanted different sig and I'll call pharmacy.

## 2015-01-04 NOTE — Telephone Encounter (Signed)
Ok to call in voltaren cream

## 2015-01-04 NOTE — Telephone Encounter (Signed)
Pt of Dr. Laney Pastor states he was given on the 3/30, and he states it is causing him constipation. Would like to know if he can call him in a cream constant NSAID.

## 2015-01-04 NOTE — Telephone Encounter (Signed)
Dustin Hurry,  Do you mind doing this?

## 2015-01-05 NOTE — Telephone Encounter (Signed)
Dr Laney Pastor, a PA is needed on voltaren gel. According to form I am trying to complete, this will not be covered for hip pain. It is only covered for pain of hands, wrists, elbows, feet, ankles, or knees. Do you know if pt has OA or pain in any of these joints that I can use that Dx on form?

## 2015-01-05 NOTE — Telephone Encounter (Signed)
He has osteoarth hip Any NSAID gels not needing prior auth??

## 2015-01-08 ENCOUNTER — Ambulatory Visit (INDEPENDENT_AMBULATORY_CARE_PROVIDER_SITE_OTHER): Payer: Medicare Other | Admitting: Internal Medicine

## 2015-01-08 VITALS — BP 144/80 | HR 68 | Temp 97.6°F | Resp 17 | Ht 73.25 in | Wt 193.6 lb

## 2015-01-08 DIAGNOSIS — Z1211 Encounter for screening for malignant neoplasm of colon: Secondary | ICD-10-CM | POA: Diagnosis not present

## 2015-01-08 DIAGNOSIS — M169 Osteoarthritis of hip, unspecified: Secondary | ICD-10-CM

## 2015-01-08 DIAGNOSIS — M25559 Pain in unspecified hip: Secondary | ICD-10-CM

## 2015-01-08 DIAGNOSIS — I159 Secondary hypertension, unspecified: Secondary | ICD-10-CM | POA: Diagnosis not present

## 2015-01-08 NOTE — Progress Notes (Signed)
   Subjective:    Patient ID: Dustin Jordan, male    DOB: 1945/10/13, 69 y.o.   MRN: [id number]  This chart was scribed for Tami Lin, MD by Stephania Fragmin, ED Scribe. This patient was seen in room 12 and the patient's care was started at 9:05 AM.   Chief Complaint  Patient presents with  . Medication Consult  . Hyperlipidemia     HPI  HPI Comments: Dustin Jordan is a 69 y.o. male with a history of DDD who presents to the [facility] for a medication consult/follow-up of right hip pain that has been ongoing for 3-4 months, per medical records. He states that oral NSAIDs have been satisfactorily alleviating his symptoms, but aggravate his BP. Tylenol alleviates the pain but doesn't affect the inflammation. He is now tried meloxicam never percent and aspirin . Patient would like to try NSAID gel. Patient will consider PT if he sees a benefit in going.   He is taking HCTZ for HTN.  Patient would also like a colorectal screening.    Review of Systems  Constitutional: Negative for fever, activity change, appetite change, fatigue and unexpected weight change.  Respiratory: Negative for chest tightness.   Cardiovascular: Negative for chest pain, palpitations and leg swelling.  Musculoskeletal: Positive for back pain. Negative for joint swelling.       Known DDD LS       Objective:   Physical Exam  Constitutional: He is oriented to person, place, and time. He appears well-developed and well-nourished. No distress.  HENT:  Head: Normocephalic and atraumatic.  Eyes: Pupils are equal, round, and reactive to light.  Neck: Normal range of motion.  Cardiovascular: Normal rate and regular rhythm.   Pulmonary/Chest: Effort normal. No respiratory distress.  Musculoskeletal: Normal range of motion.  Neurological: He is alert and oriented to person, place, and time.  Skin: Skin is warm and dry.  Psychiatric: He has a normal mood and affect. His behavior is normal.    Nursing note and vitals reviewed. BP 144/80 mmHg  Pulse 68  Temp(Src) 97.6 F (36.4 C) (Oral)  Resp 17  Ht 6' 1.25" (1.861 m)  Wt 193 lb 9.6 oz (87.816 kg)  BMI 25.36 kg/m2  SpO2 100%  Patient is here for discussion only.     Assessment & Plan:   I have completed the patient encounter in its entirety as documented by the scribe, with editing by me where necessary. Jamylah Marinaccio P. Laney Pastor, M.D. Hip pain, unspecified laterality  Secondary hypertension, unspecified  Screening for colon cancer - Plan: IFOBT POC (occult bld, rslt in office)  Osteoarthritis of hip, unspecified laterality, unspecified osteoarthritis type  We will try to get prior authorization for Voltaren gel from his insurance He is given a handout about osteoarthritis listing all the numerous non-prescription alternatives Hemosure given He is not ready for orthopedic referral to consider injections

## 2015-01-08 NOTE — Telephone Encounter (Signed)
I called pharmacist bc I didn't know of any true alternatives for topical NSAID and this was confirmed by pharm. Only topical alternatives would be OTC Aspercreme type creams. It would cost $67.99 to pay OOP for voltaren gel. I will send in PA with OA and pain of hip as Dx but don't expect it to be approved. PA pending through covermymeds.

## 2015-01-11 LAB — IFOBT (OCCULT BLOOD): IFOBT: NEGATIVE

## 2015-01-17 ENCOUNTER — Ambulatory Visit (INDEPENDENT_AMBULATORY_CARE_PROVIDER_SITE_OTHER): Payer: Medicare Other

## 2015-01-17 ENCOUNTER — Ambulatory Visit (INDEPENDENT_AMBULATORY_CARE_PROVIDER_SITE_OTHER): Payer: Medicare Other | Admitting: Internal Medicine

## 2015-01-17 VITALS — BP 150/82 | HR 55 | Temp 97.6°F | Resp 18 | Ht 72.05 in | Wt 191.6 lb

## 2015-01-17 DIAGNOSIS — R140 Abdominal distension (gaseous): Secondary | ICD-10-CM | POA: Diagnosis not present

## 2015-01-17 LAB — POCT URINALYSIS DIPSTICK
Glucose, UA: NEGATIVE
LEUKOCYTES UA: NEGATIVE
NITRITE UA: NEGATIVE
RBC UA: NEGATIVE
Spec Grav, UA: >=1.03
Urobilinogen, UA: 0.2
pH, UA: 5

## 2015-01-17 LAB — POCT UA - MICROSCOPIC ONLY
Bacteria, U Microscopic: NEGATIVE
CRYSTALS, UR, HPF, POC: NEGATIVE
Casts, Ur, LPF, POC: NEGATIVE
YEAST UA: NEGATIVE

## 2015-01-17 LAB — POCT CBC
Granulocyte percent: 63.2 %G (ref 37–80)
HCT, POC: 46 % (ref 43.5–53.7)
HEMOGLOBIN: 15.1 g/dL (ref 14.1–18.1)
Lymph, poc: 2.6 (ref 0.6–3.4)
MCH: 27.6 pg (ref 27–31.2)
MCHC: 32.8 g/dL (ref 31.8–35.4)
MCV: 84 fL (ref 80–97)
MID (cbc): 0.3 (ref 0–0.9)
MPV: 8.5 fL (ref 0–99.8)
POC Granulocyte: 5 (ref 2–6.9)
POC LYMPH PERCENT: 32.5 %L (ref 10–50)
POC MID %: 4.3 % (ref 0–12)
Platelet Count, POC: 242 10*3/uL (ref 142–424)
RBC: 5.47 M/uL (ref 4.69–6.13)
RDW, POC: 15.9 %
WBC: 7.9 10*3/uL (ref 4.6–10.2)

## 2015-01-17 LAB — POCT SEDIMENTATION RATE: POCT SED RATE: 36 mm/h — AB (ref 0–22)

## 2015-01-17 NOTE — Progress Notes (Signed)
Subjective:    Patient ID: Dustin Jordan, male    DOB: [birth year], 69 y.o.   MRN: [id number]  HPI  Bloated feeling, not passing excessive gas.  Feeling overly full after eating.  Denies Diarrhea, Not having regular bowel movements.   Bowel movements have not been hard, they are " in between" No weight loss No anorexia No night sweats No fever No nausea or vomiting No blood in urine No abdominal pain  No fatigue Quit smoking 5 years ago, smoked for apx. 20 years  Feels out of breath with running Rides a bike for exercise Hx of bladder cancer with partial cystectomy.   Review of Systems  Constitutional: Negative.   HENT: Negative.   Respiratory: Negative.   Gastrointestinal: Positive for constipation. Negative for nausea, vomiting, abdominal pain, diarrhea, blood in stool, abdominal distention, anal bleeding and rectal pain.  Genitourinary: Negative.   Neurological: Negative.   Psychiatric/Behavioral: Negative.        Objective:   Physical Exam  Constitutional: He is oriented to person, place, and time. He appears well-developed and well-nourished. No distress.  HENT:  Head: Normocephalic.  Mouth/Throat: Oropharynx is clear and moist.  Eyes: EOM are normal. No scleral icterus.  Cardiovascular: Normal rate, regular rhythm and normal heart sounds.   Pulmonary/Chest: Effort normal and breath sounds normal.  Abdominal: Soft. He exhibits no distension. Bowel sounds are increased. There is no hepatosplenomegaly. There is no tenderness. There is no CVA tenderness. No hernia.  High pitched increased bowel sounds  Neurological: He is alert and oriented to person, place, and time. He exhibits normal muscle tone. Coordination normal.  Psychiatric: He has a normal mood and affect. Thought content normal.   Results for orders placed or performed in visit on 01/17/15  POCT CBC  Result Value Ref Range   WBC 7.9 4.6 - 10.2 K/uL   Lymph, poc 2.6 0.6 - 3.4   POC LYMPH PERCENT  32.5 10 - 50 %L   MID (cbc) 0.3 0 - 0.9   POC MID % 4.3 0 - 12 %M   POC Granulocyte 5.0 2 - 6.9   Granulocyte percent 63.2 37 - 80 %G   RBC 5.47 4.69 - 6.13 M/uL   Hemoglobin 15.1 14.1 - 18.1 g/dL   HCT, POC 46.0 43.5 - 53.7 %   MCV 84.0 80 - 97 fL   MCH, POC 27.6 27 - 31.2 pg   MCHC 32.8 31.8 - 35.4 g/dL   RDW, POC 15.9 %   Platelet Count, POC 242 142 - 424 K/uL   MPV 8.5 0 - 99.8 fL  POCT urinalysis dipstick  Result Value Ref Range   Color, UA amber    Clarity, UA clear    Glucose, UA neg    Bilirubin, UA small    Ketones, UA trace    Spec Grav, UA >=1.030    Blood, UA neg    pH, UA 5.0    Protein, UA trace    Urobilinogen, UA 0.2    Nitrite, UA neg    Leukocytes, UA Negative   POCT UA - Microscopic Only  Result Value Ref Range   WBC, Ur, HPF, POC 4-6    RBC, urine, microscopic 2-5    Bacteria, U Microscopic neg    Mucus, UA large    Epithelial cells, urine per micros 0-2    Crystals, Ur, HPF, POC neg    Casts, Ur, LPF, POC neg    Yeast, UA neg  [facility] Policy for Prescribing Controlled Substances (Revised 07/2012) 1. Prescriptions for controlled substances will be filled by ONE provider at [facility] with whom you have established and developed a plan for your care, including follow-up. 2. You are encouraged to schedule an appointment with your prescriber at our appointment center for follow-up visits whenever possible. 3. If you request a prescription for the controlled substance while at [facility] for an acute problem (with someone other than your regular prescriber), you MAY be given a ONE-TIME prescription for a 30-day supply of the controlled substance, to allow time for you to return to see your regular prescriber for additional prescriptions.   [facility] reading (PRIMARY) by  Dr Elder Cyphers prominent right hilum, probable rotation, air fluid levels present, cause unclear.. Radiology stated ileus only.  Assessment & Plan:  Ileus/Possible constipation Trial miralax/f/up Friday  am

## 2015-01-17 NOTE — Patient Instructions (Signed)

## 2015-01-19 ENCOUNTER — Observation Stay (HOSPITAL_COMMUNITY)
Admission: AD | Admit: 2015-01-19 | Discharge: 2015-01-19 | Disposition: A | Discharge status: Home / Self Care | Payer: Medicare Other | Source: Ambulatory Visit | Attending: Family Medicine | Admitting: Family Medicine

## 2015-01-19 ENCOUNTER — Ambulatory Visit (INDEPENDENT_AMBULATORY_CARE_PROVIDER_SITE_OTHER): Payer: Medicare Other | Admitting: Internal Medicine

## 2015-01-19 ENCOUNTER — Encounter (HOSPITAL_COMMUNITY): Payer: Self-pay

## 2015-01-19 ENCOUNTER — Encounter: Payer: Self-pay | Admitting: Family Medicine

## 2015-01-19 ENCOUNTER — Encounter (HOSPITAL_COMMUNITY): Payer: Self-pay | Admitting: Surgery

## 2015-01-19 ENCOUNTER — Telehealth: Payer: Self-pay | Admitting: Physician Assistant

## 2015-01-19 ENCOUNTER — Ambulatory Visit (HOSPITAL_COMMUNITY)
Admission: RE | Admit: 2015-01-19 | Discharge: 2015-01-19 | Disposition: A | Discharge status: Home / Self Care | Payer: Medicare Other | Source: Ambulatory Visit | Attending: Internal Medicine | Admitting: Internal Medicine

## 2015-01-19 ENCOUNTER — Telehealth: Payer: Self-pay | Admitting: Family Medicine

## 2015-01-19 VITALS — BP 144/94 | HR 67 | Temp 97.7°F | Resp 17 | Ht 73.5 in | Wt 193.0 lb

## 2015-01-19 DIAGNOSIS — K56600 Partial intestinal obstruction, unspecified as to cause: Secondary | ICD-10-CM | POA: Diagnosis present

## 2015-01-19 DIAGNOSIS — Z87891 Personal history of nicotine dependence: Secondary | ICD-10-CM | POA: Insufficient documentation

## 2015-01-19 DIAGNOSIS — R140 Abdominal distension (gaseous): Secondary | ICD-10-CM | POA: Diagnosis not present

## 2015-01-19 DIAGNOSIS — C679 Malignant neoplasm of bladder, unspecified: Principal | ICD-10-CM

## 2015-01-19 DIAGNOSIS — K5669 Other intestinal obstruction: Secondary | ICD-10-CM

## 2015-01-19 DIAGNOSIS — I10 Essential (primary) hypertension: Secondary | ICD-10-CM | POA: Insufficient documentation

## 2015-01-19 LAB — BASIC METABOLIC PANEL
ANION GAP: 9 (ref 5–15)
BUN: 14 mg/dL (ref 6–23)
CALCIUM: 9.8 mg/dL (ref 8.4–10.5)
CO2: 27 mmol/L (ref 19–32)
Chloride: 102 mmol/L (ref 96–112)
Creatinine, Ser: 1.14 mg/dL (ref 0.50–1.35)
GFR calc non Af Amer: 64 mL/min — ABNORMAL LOW (ref >90)
GFR, EST AFRICAN AMERICAN: 74 mL/min — AB (ref >90)
GLUCOSE: 92 mg/dL (ref 70–99)
POTASSIUM: 3.9 mmol/L (ref 3.5–5.1)
SODIUM: 138 mmol/L (ref 135–145)

## 2015-01-19 MED ORDER — ENOXAPARIN SODIUM 30 MG/0.3ML ~~LOC~~ SOLN
30.0000 mg | SUBCUTANEOUS | Status: DC
  Filled 2015-01-19: qty 0.3

## 2015-01-19 MED ORDER — HYDROCODONE-ACETAMINOPHEN 5-325 MG PO TABS
1.0000 | ORAL_TABLET | ORAL | Status: DC | PRN
  Administered 2015-01-19: 1 via ORAL
  Filled 2015-01-19: qty 2

## 2015-01-19 MED ORDER — PROMETHAZINE HCL 25 MG PO TABS: 12.5000 mg | ORAL_TABLET | Freq: Four times a day (QID) | ORAL | Status: DC | PRN

## 2015-01-19 MED ORDER — IOHEXOL 300 MG/ML  SOLN
100.0000 mL | Freq: Once | INTRAMUSCULAR | Status: AC | PRN
  Administered 2015-01-19: 100 mL via INTRAVENOUS

## 2015-01-19 MED ORDER — ALUM & MAG HYDROXIDE-SIMETH 200-200-20 MG/5ML PO SUSP: 30.0000 mL | Freq: Four times a day (QID) | ORAL | Status: DC | PRN

## 2015-01-19 NOTE — Discharge Summary (Signed)
[facility] Discharge Summary  Patient name: Dustin Jordan Medical record number: [id number] Date of birth: 08-Sep-1946 Age: 69 y.o. Gender: male Date of Admission: 01/19/2015  Date of Discharge: 01/19/2015 Admitting Physician: Blane Ohara McDiarmid, MD  Primary Care Provider: Leandrew Koyanagi, MD Consultants: Oncology, Dr. 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 No gastric distention on palpation or percussion.  Skin: Laparoscopic surgical scars on abdomen well healed.   Brief Hospital Course:  VERNIS EID is a 69 y.o. 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 PMH is significant for stage III (T3bN0M0), high grade urothelial carcinoma.   Dr. [name], oncology, was consulted and made the following recommendations:  - lymph node biopsy per IR  - CT chest - bone scan - PET scan This work up can be pursued as outpatient.   Partial SBO resolved at time of admission after po contrast. Pt tolerated 100% of dinner tray without nausea, vomiting or abdominal pain/distention. After discussion, he would like to go home tonight as the IR procedure for biopsy will not be possible over the weekend. He is given appropriate criteria to return to the ED if symptoms of SBO return.  Issues for Follow Up:  1. Mr. Mysliwiec will call for PCP follow up early next week and will need order for LN biopsy by IR at [facility] (they were unable to do this over the weekend). This can  be scheduled by calling the radiology scheduler.  2. He will also need above imaging studies, which can be organized by oncology after tissue diagnosis.  3. Follow up abdominal distention. SBO resolved at time of admission and pt is to continue miralax daily.  Significant Procedures: None  Significant Labs and Imaging:   Recent Labs Lab 01/17/15 1046  WBC 7.9  HGB 15.1  HCT 46.0    Recent Labs Lab 01/19/15 1210  NA 138  K 3.9  CL 102  CO2 27  GLUCOSE 92  BUN 14  CREATININE 1.14  CALCIUM 9.8   CT ABD/PELVIS W/PO CONTRAST  Lung bases are clear. Bochdalek's hernia right posterior hemidiaphragm unchanged from prior studies. Heart size is normal. Negative for hiatal hernia  Liver and spleen are homogeneous and normal. Gallbladder and bile ducts normal. Pancreas normal.  Kidneys are normal. No renal obstruction or mass. No filling defect in the renal pelvis on delayed imaging.  Partial cystectomy. Small urinary bladder without obvious mass lesion. No bladder stone identified. Prostate is moderately enlarged.  Proximal small bowel mildly dilated measuring up to 31 mm in the jejunum. A transition point in the mid small bowel were there is tumor in the omentum causing obstruction. Terminal ileum decompressed. Colon nondilated.  Pelvic adenopathy is present right greater than left. Enlarged right external iliac lymph nodes with a lymph node mass measuring 7 x 3.5 cm. Enlarged right obturator lymph node with adjacent bony erosion of the right iliac bone due to direct bony invasion of tumor. Tumor is present in the omentum . Left external iliac lymph node mass measures 37 x 18 mm. 15 mm left common iliac  lymph nodes are enlarged as well. Small amount of fluid in the lateral pelvis anteriorly  IMPRESSION: Interval development of pelvic adenopathy right greater than left consistent with metastatic disease. There is bony erosion of the right iliac bone compatible  with tumor.  Partial small bowel obstruction. There is tumor in the omentum and there is a transition point in the mid small bowel compatible with bowel obstruction due to tumor.  Results/Tests Pending at Time of Discharge: None  Discharge Medications:    Medication List    TAKE these medications        hydrochlorothiazide 12.5 MG capsule  Commonly known as:  MICROZIDE  Take 1 capsule (12.5 mg total) by mouth every morning.      ASK your doctor about these medications        OVER THE COUNTER MEDICATION  Take 1 tablet by mouth daily.        Discharge Instructions: Please refer to Patient Instructions section of EMR for full details.  Patient was counseled important signs and symptoms that should prompt return to medical care, changes in medications, dietary instructions, activity restrictions, and follow up appointments.   Follow-Up Appointments:     Follow-up Information    Follow up with Leandrew Koyanagi, MD. Call on 01/22/2015.   Specialties:  Internal Medicine, Adolescent Medicine   Contact information:   [place] [address] [phone]       Patrecia Pour, MD 01/19/2015, 8:27 PM PGY-2, [place]

## 2015-01-19 NOTE — Telephone Encounter (Signed)
Received call report regarding his CT today- he has a history of bladder cancer.  He now has likely mets to his small bowel and bone.  He has been seen twice this week for abd bloating and discomfort.  He is still able to eat pretty well, but admits that he is having a hard time passing stool.   Called intp FP team and they kindly agreed to directly admit pt.   Let pt know and he will proceed to admitting for admission to hosp IMPRESSION: Interval development of pelvic adenopathy right greater than left consistent with metastatic disease. There is bony erosion of the right iliac bone compatible with tumor.  Partial small bowel obstruction. There is tumor in the omentum and there is a transition point in the mid small bowel compatible with bowel obstruction due to tumor.

## 2015-01-19 NOTE — Patient Instructions (Signed)
Small Bowel Obstruction °A small bowel obstruction is a blockage (obstruction) of the small intestine (small bowel). The small bowel is a long, slender tube that connects the stomach to the colon. Its job is to absorb nutrients from the fluids and foods you consume into the bloodstream.  °CAUSES  °There are many causes of intestinal blockage. The most common ones include: °· Hernias. This is a more common cause in children than adults. °· Inflammatory bowel disease (enteritis and colitis). °· Twisting of the bowel (volvulus). °· Tumors. °· Scar tissue (adhesions) from previous surgery or radiation treatment. °· Recent surgery. This may cause an acute small bowel obstruction called an ileus. °SYMPTOMS  °· Abdominal pain. This may be dull cramps or sharp pain. It may occur in one area or may be present in the entire abdomen. Pain can range from mild to severe, depending on the degree of obstruction. °· Nausea and vomiting. Vomit may be greenish or yellow bile color. °· Distended or swollen stomach. Abdominal bloating is a common symptom. °· Constipation. °· Lack of passing gas. °· Frequent belching. °· Diarrhea. This may occur if runny stool is able to leak around the obstruction. °DIAGNOSIS  °Your caregiver can usually diagnose small bowel obstruction by taking a history, doing a physical exam, and taking X-rays. If the cause is unclear, a CT scan (computerized tomography) of your abdomen and pelvis may be needed. °TREATMENT  °Treatment of the blockage depends on the cause and how bad the problem is.  °· Sometimes, the obstruction improves with bed rest and intravenous (IV) fluids. °· Resting the bowel is very important. This means following a simple diet. Sometimes, a clear liquid diet may be required for several days. °· Sometimes, a small tube (nasogastric tube) is placed into the stomach to decompress the bowel. When the bowel is blocked, it usually swells up like a balloon filled with air and fluids.  Decompression means that the air and fluids are removed by suction through that tube. This can help with pain, discomfort, and nausea. It can also help the obstruction resolve faster. °· Surgery may be required if other treatments do not work. Bowel obstruction from a hernia may require early surgery and can be an emergency procedure. Adhesions that cause frequent or severe obstructions may also require surgery. °HOME CARE INSTRUCTIONS °If your bowel obstruction is only partial or incomplete, you may be allowed to go home. °· Get plenty of rest. °· Follow your diet as directed by your caregiver. °· Only consume clear liquids until your condition improves. °· Avoid solid foods as instructed. °SEEK IMMEDIATE MEDICAL CARE IF: °· You have increased pain or cramping. °· You vomit blood. °· You have uncontrolled vomiting or nausea. °· You cannot drink fluids due to vomiting or pain. °· You develop confusion. °· You begin feeling very dry or thirsty (dehydrated). °· You have severe bloating. °· You have chills. °· You have a fever. °· You feel extremely weak or you faint. °MAKE SURE YOU: °· Understand these instructions. °· Will watch your condition. °· Will get help right away if you are not doing well or get worse. °Document Released: 12/02/2005 Document Revised: 12/08/2011 Document Reviewed: 11/29/2010 °ExitCare® Patient Information ©2015 [employer]. This information is not intended to replace advice given to you by your health care provider. Make sure you discuss any questions you have with your health care provider. ° °

## 2015-01-19 NOTE — Telephone Encounter (Signed)
Dr. Lorelei Pont admitted pt to [facility]. She states everything has been handled.

## 2015-01-19 NOTE — Consult Note (Signed)
Referral MD  Reason for Referral: Recurrent bladder cancer   No chief complaint on file. : I have part of my intestines blocked.  HPI: [name] is a very nice 69 year old African-American gentleman. He she underwent a procedure last year. He underwent a partial cystectomy with lymph node dissection. This was in May of last year. The pathology report showed that he had a high-grade urothelial tumor. Lymph nodes were negative. He had a stage III (T3bNoM0) bladder cancer.  He recovered well.  He now is admitted because of a partial bowel instructions. He's not complaining of any nausea or vomiting. He's had no bleeding. He's had no leg swelling.  He has some discomfort over on the right side of his abdomen. He thought that this was sciatica.  He had a CT scan done. This showed development of pelvic adenopathy with right greater than left. There is some bony erosion of the right iliac bone. He had omental tumor. There is partial small bowel obstruction.  His lab work looks okay. His BUN is 14 creatinine 1.14. Calcium is 9.8. He is not anemic.  We are asked to see him for recommendations as far as diagnosis and management.  He does not smoke. He stopped smoking 3 years ago. He was a Administrator.  There is no family history of malignancy.   There has been no weight loss or weight gain.  He has a good performance status. I say his performance status is ECOG 1.   Past Medical History  Diagnosis Date  . Hypertension   . White coat hypertension   . Swelling of right knee joint     s/p arthroscopy 06-08-2013  . Bladder cancer   . Hematuria   . Frequency of urination   :  Past Surgical History  Procedure Laterality Date  . Knee arthroscopy Right 06-08-2013  . Cystoscopy w/ retrogrades Bilateral 06/22/2013    Procedure: CYSTOSCOPY WITH RETROGRADE PYELOGRAM;  Surgeon: Alexis Frock, MD;  Location: [facility];  Service: Urology;  Laterality: Bilateral;  .  Transurethral resection of bladder tumor with gyrus (turbt-gyrus) N/A 06/22/2013    Procedure: TRANSURETHRAL RESECTION OF BLADDER TUMOR WITH GYRUS (TURBT-GYRUS);  Surgeon: Alexis Frock, MD;  Location: [facility];  Service: Urology;  Laterality: N/A;  . Cystoscopy with retrograde pyelogram, ureteroscopy and stent placement Bilateral 01/02/2014    Procedure: CYSTOSCOPY WITH RETROGRADE PYELOGRAM BILATERAL URETERS  AND TRANSURETHRAL RESECTION OF A BLADDER TUMOR WITH MULTIPLE BX;  Surgeon: Alexis Frock, MD;  Location: [facility];  Service: Urology;  Laterality: Bilateral;  . Robot assisted laparoscopic complete cystect ileal conduit N/A 02/17/2014    Procedure: ROBOTIC ASSISTED LAPAROSCOPIC PARTIAL CYSTECTOMY;  Surgeon: Alexis Frock, MD;  Location: [facility];  Service: Urology;  Laterality: N/A;  . Lymphadenectomy Bilateral 02/17/2014    Procedure: BILATERAL LYMPH NODE DISSECTION;  Surgeon: Alexis Frock, MD;  Location: [facility];  Service: Urology;  Laterality: Bilateral;  . Cystoscopy N/A 02/17/2014    Procedure: CYSTOSCOPY WITH INDOCYANINE GREEN DYE;  Surgeon: Alexis Frock, MD;  Location: [facility];  Service: Urology;  Laterality: N/A;  :  No current facility-administered medications for this encounter.:  :  No Known Allergies:  Family History  Problem Relation Age of Onset  . Thyroid disease Neg Hx   :  History   Social History  . Marital Status: Single    Spouse Name: N/A  . Number of Children: N/A  . Years of Education: N/A   Occupational History  .  Driver      part time   Social History Main Topics  . Smoking status: Former Smoker -- 1.00 packs/day for 20 years    Types: Cigarettes    Quit date: 06/15/2010  . Smokeless tobacco: Never Used  . Alcohol Use: No  . Drug Use: No  . Sexual Activity: Not on file   Other Topics Concern  . Not on file   Social History Narrative  :  Pertinent items are noted in HPI.  Exam: Patient Vitals for the past 24 hrs:  Height  Weight  01/19/15 1800 6' 1.5" (1.867 m) 191 lb 12.8 oz (87 kg)    well-developed and well-nourished African-American male. Head and neck exam shows no ocular or oral lesions. He has no adenopathy in the neck. His lungs are clear bilaterally. Cardiac exam regular rate and rhythm with no murmurs, rubs or bruits. Abdomen is soft. He has good bowel sounds. There is no fluid wave. There may be some slight tenderness over on the right side of the abdomen. There is no inguinal adenopathy bilaterally. Extremities shows no clubbing, cyanosis or edema. Skin exam shows no rashes, ecchymoses or petechia. Neurological exam is nonfocal.    Recent Labs  01/17/15 1046  WBC 7.9  HGB 15.1  HCT 46.0    Recent Labs  01/19/15 1210  NA 138  K 3.9  CL 102  CO2 27  GLUCOSE 92  BUN 14  CREATININE 1.14  CALCIUM 9.8    Blood smear review: None  Pathology: None     Assessment and Plan: Mr. Foot is a very nice 69 year old African-American male. He had stage III bladder cancer about a year ago. Now, looks like it has recurred.  I think that we will need a biopsy to prove that this is urothelial malignancy. I think from the CT scan, there is ample adenopathy to get a biopsy.  I think that a bone scan would be helpful. Also, I think a PET scan would help [place] out.  Since he is an inpatient, I would do a CT of his chest.  I would give him a dose of Zometa. This could help with minimizing bone fracture.  I talked him about the role of chemotherapy. I will see at this is a surgical problem. We could offer radiation therapy to the right iliac region. However, I that he ultimately will need chemotherapy.  He is in great shape. As such, we can certainly be aggressive with him.  He'll be interesting to see what happens with this partial bowel obstruction. I could postulate a role for surgery for that but he does not seem to be in distress with this.  The fact that his cancer has recurred within a year is  clearly an ominous feature.  I would think that with systemic chemotherapy, that we would have a good chance of helping him. I told him that we cannot cure this but that we can at least it and try to control it so that he will not have problems.  We will follow along.  I appreciate the opportunity to have seen him.  Pete E.

## 2015-01-19 NOTE — Telephone Encounter (Signed)
Please call the patient.  Dr Elder Cyphers would like him to come in tomorrow and recheck and review his CT results.  Dr Elder Cyphers will be here sat 1-4 and all day Sunday.

## 2015-01-19 NOTE — Discharge Instructions (Signed)
Dustin Jordan, it was fantastic to meet you. I'm sorry to see you go so soon, but I believe your condition is best handled as an outpatient. To make sure everything is arranged, you need to call [facility] to schedule an appointment for Monday. They will see the discharge summary from this hospitalization and have directions on who to call to set up the biopsy here at the hospital.   You have a very mild PARTIAL obstruction of your bowel, meaning that a tumor that has grown from a few cells that began in your bladder cancer is pressing on your small bowel. This isn't keeping contents from passing through because you are having bowel movements and you are not throwing up. If either of these things changes (you are unable to eat because you are throwing up, you have abdominal pain and/or you are not able to poop or pass gas) you need to come back to the hospital.

## 2015-01-19 NOTE — H&P (Signed)
[facility] Admission History and Physical Service Pager: [phone]  Patient name: Dustin Jordan Medical record number: [id number] Date of birth: Jun 20, 1946 Age: 69 y.o. Gender: male  Primary Care Provider: Leandrew Koyanagi, MD Consultants: Oncology, Dr. [name] Code Status: Full  Chief Complaint: small bowel obstruction  Assessment and Plan: Dustin Jordan is a 69 y.o. 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 PMH is significant for stage III (T3bN0M0), high grade urothelial carcinoma.   High-Grade Bladder CA: s/p partial cystectomy with cysto / ICG injection and pelvic lymphadenectomy 02/17/2014 for large, sessile high-grade urothelial carcinoma of bladder dome, final path pT3bN0Mx with negative margins.  - Oncology, Dr. [name], consulted, recommendations:   - lymph node biopsy per IR   - CT chest  - bone scan  - PET scan - Work up can be pursued as outpatient.   Partial SBO: Pt continues to have flatus and loose BMs, including a large volume BM following administration of po contrast for imaging study. Currently does not seem to be critical or causing any distress. It seems the symptoms he was reporting to Dr. Lorelei Pont have largely resolved.  - Will discuss admission with patient as it seems obstructive symptoms have resolved, obviating need for admission.  - Patient is hungry, will give dinner tray - Continue miralax daily  FEN/GI: Regular diet, saline lock IV Prophylaxis: Lovenox 40mg  daily  Disposition: Pending discussion with pt, may be same day D/C  History of Present Illness: Dustin Jordan is a 69 y.o. male presenting with partial SBO on CT abdomen investigating abdominal bloating.  My. Rudden reports increasing bloating for the past 1-2 weeks causing him to present to [facility]. He also reported early satiety and constipation at that time  for which he was prescribed miralax. This continued with only passing occasional flatus and smaller-than-normal sized BM (though I see in EMR that he reported continued constipation at the office visit earlier today). Due to concern for ileus/SBO he was sent for imaging. Imaging showed partial SBO and he was directly admitted. At the time of my interview he reports he has had no nausea or vomiting, continuing to eat. Miralax has helped with bloating sensation and he denies early satiety today. He had a large BM with some liquid, no blood, no mucous, after imaging study where he was given po contrast. He wants to go home.   He is a nonsmoker (but 20 year history), lives alone, drives a truck for a living. Denies fever, chills, night sweats, weight loss, decreased appetite, chest pain dyspnea, palpitations, orthopnea, myalgias, arthralgias, rash.   Review Of Systems: Per HPI Otherwise 12 point review of systems was performed and was unremarkable.  Patient Active Problem List   Diagnosis Date Noted  . Thyroid mass 10/04/2014  . Clot retention of urine 01/01/2014  . Bladder cancer 07/26/2013  . HTN (hypertension) 04/20/2013  . Hematuria 04/20/2013   Past Medical History: Past Medical History  Diagnosis Date  . Hypertension   . White coat hypertension   . Swelling of right knee joint     s/p arthroscopy 06-08-2013  . Bladder cancer   . Hematuria   . Frequency of urination    Past Surgical History: Past Surgical History  Procedure Laterality Date  . Knee arthroscopy Right 06-08-2013  . Cystoscopy w/ retrogrades Bilateral 06/22/2013    Procedure: CYSTOSCOPY WITH RETROGRADE PYELOGRAM;  Surgeon: Hubbard Robinson  [name], MD;  [facility];  Service: Urology;  Laterality: Bilateral;  . Transurethral resection of bladder tumor with gyrus (turbt-gyrus) N/A 06/22/2013    Procedure: TRANSURETHRAL RESECTION OF BLADDER TUMOR WITH GYRUS (TURBT-GYRUS);  Surgeon: Alexis Frock, MD;   Location: [facility];  Service: Urology;  Laterality: N/A;  . Cystoscopy with retrograde pyelogram, ureteroscopy and stent placement Bilateral 01/02/2014    Procedure: CYSTOSCOPY WITH RETROGRADE PYELOGRAM BILATERAL URETERS  AND TRANSURETHRAL RESECTION OF A BLADDER TUMOR WITH MULTIPLE BX;  Surgeon: Alexis Frock, MD;  Location: [facility];  Service: Urology;  Laterality: Bilateral;  . Robot assisted laparoscopic complete cystect ileal conduit N/A 02/17/2014    Procedure: ROBOTIC ASSISTED LAPAROSCOPIC PARTIAL CYSTECTOMY;  Surgeon: Alexis Frock, MD;  Location: [facility];  Service: Urology;  Laterality: N/A;  . Lymphadenectomy Bilateral 02/17/2014    Procedure: BILATERAL LYMPH NODE DISSECTION;  Surgeon: Alexis Frock, MD;  Location: [facility];  Service: Urology;  Laterality: Bilateral;  . Cystoscopy N/A 02/17/2014    Procedure: CYSTOSCOPY WITH INDOCYANINE GREEN DYE;  Surgeon: Alexis Frock, MD;  Location: [facility];  Service: Urology;  Laterality: N/A;   Social History: History  Substance Use Topics  . Smoking status: Former Smoker -- 1.00 packs/day for 20 years    Types: Cigarettes    Quit date: 06/15/2010  . Smokeless tobacco: Never Used  . Alcohol Use: No   Additional social history: As above  Please also refer to relevant sections of EMR.  Family History: Family History  Problem Relation Age of Onset  . Thyroid disease Neg Hx    Allergies and Medications: No Known Allergies No current facility-administered medications on file prior to encounter.   Current Outpatient Prescriptions on File Prior to Encounter  Medication Sig Dispense Refill  . hydrochlorothiazide (MICROZIDE) 12.5 MG capsule Take 1 capsule (12.5 mg total) by mouth every morning. 90 capsule 3    Objective: Ht 6' 1.5" (1.867 m)  Wt 191 lb 12.8 oz (87 kg)  BMI 24.96 kg/m2 Exam General: WDWN 69 yo male in no distress HEENT: MMM, oropharynx clear, anicteric sclerae Cardiovascular: RRR, no murmur, no JVD or edema.  radial and DP pulses 2+ symmetrically Respiratory: Nonlabored on room air, CTAB Abdomen: Soft, NT, ND, Normal bowel sounds. No gastric distention on palpation or percussion.  Extremities: Warm, well perfused, no edema Skin: Laparoscopic surgical scars on abdomen well healed.  Neuro: Alert, oriented, speech and gait normal. vision normal.   Labs and Imaging: CBC BMET   Recent Labs Lab 01/17/15 1046  WBC 7.9  HGB 15.1  HCT 46.0    Recent Labs Lab 01/19/15 1210  NA 138  K 3.9  CL 102  CO2 27  BUN 14  CREATININE 1.14  GLUCOSE 92  CALCIUM 9.8     CT ABD/PELVIS W/PO CONTRAST  Lung bases are clear. Bochdalek's hernia right posterior hemidiaphragm unchanged from prior studies. Heart size is normal. Negative for hiatal hernia  Liver and spleen are homogeneous and normal. Gallbladder and bile ducts normal. Pancreas normal.  Kidneys are normal. No renal obstruction or mass. No filling defect in the renal pelvis on delayed imaging.  Partial cystectomy. Small urinary bladder without obvious mass lesion. No bladder stone identified. Prostate is moderately enlarged.  Proximal small bowel mildly dilated measuring up to 31 mm in the jejunum. A transition point in the mid small bowel were there is tumor in the omentum causing obstruction. Terminal ileum decompressed. Colon nondilated.  Pelvic adenopathy is present right  greater than left. Enlarged right external iliac lymph nodes with a lymph node mass measuring 7 x 3.5 cm. Enlarged right obturator lymph node with adjacent bony erosion of the right iliac bone due to direct bony invasion of tumor. Tumor is present in the omentum . Left external iliac lymph node mass measures 37 x 18 mm. 15 mm left common iliac lymph nodes are enlarged as well. Small amount of fluid in the lateral pelvis anteriorly  IMPRESSION: Interval development of pelvic adenopathy right greater than left consistent with metastatic disease. There  is bony erosion of the right iliac bone compatible with tumor.  Partial small bowel obstruction. There is tumor in the omentum and there is a transition point in the mid small bowel compatible with bowel obstruction due to tumor.   Patrecia Pour, MD 01/19/2015, 5:23 PM PGY-2, [facility] pager: [phone], text pages welcome

## 2015-01-19 NOTE — Progress Notes (Signed)
   Subjective:    Patient ID: Dustin Jordan, male    DOB: [birth year], 69 y.o.   MRN: [id number]  HPI scribed by Leroy Kennedy LPN with Dr Elder Cyphers room 3  Returns no better, feels generally swollen, no pain, feels need to go but cannot move bowels, miralax caused no change. No vomiting, diarrhea ,nausea. No blood seen. Hx bladder cancer [facility] radiology reading, partial obstruction versus ileus Sed rate 36 high, cbc normal  Review of Systems     Objective:   Physical Exam  Constitutional: He is oriented to person, place, and time. He appears well-developed and well-nourished. No distress.  HENT:  Head: Normocephalic.  Eyes: EOM are normal. Pupils are equal, round, and reactive to light. No scleral icterus.  Neck: Normal range of motion.  Cardiovascular: Normal rate.   Pulmonary/Chest: Effort normal.  Abdominal: Soft. Bowel sounds are normal. He exhibits distension. He exhibits no mass. There is no tenderness. There is no rebound and no guarding.  Neurological: He is alert and oriented to person, place, and time. He exhibits normal muscle tone. Coordination normal.  Psychiatric: He has a normal mood and affect.          Assessment & Plan:  CT abdomen/pelvis with contrast If normal may need enemas

## 2015-01-19 NOTE — Progress Notes (Signed)
Patient being dicharged to home, discharge instructions given with verbal understanding. IV discontinued, site clear, catheter intact, pressure dressing applied. Patient taken to personal vehicle with all belongings at this time.

## 2015-01-20 ENCOUNTER — Telehealth: Payer: Self-pay | Admitting: Internal Medicine

## 2015-01-20 DIAGNOSIS — R590 Localized enlarged lymph nodes: Secondary | ICD-10-CM | POA: Insufficient documentation

## 2015-01-20 DIAGNOSIS — C671 Malignant neoplasm of dome of bladder: Secondary | ICD-10-CM

## 2015-01-20 NOTE — Telephone Encounter (Signed)
lmom that we had set up biopsy.

## 2015-01-20 NOTE — Telephone Encounter (Signed)
Following hospitalization he needs lymph node biopsy by interventional radiology (enlarged right external iliac lymph node) then - CT chest - bone scan - PET scan But This work up can be pursued as outpatient after tissue diagnosis with referral to appropriate oncology

## 2015-01-20 NOTE — Telephone Encounter (Signed)
Patient was admitted at [facility] yesterday. They released him last night. The doctor on duty at the hospital stated they need Dustin Jordan to set up a biopsy first thing Monday Morning. [phone].

## 2015-01-20 NOTE — Telephone Encounter (Signed)
I have placed the order for interventional radiology to biopsy his iliac lymphadenopathy

## 2015-01-23 ENCOUNTER — Other Ambulatory Visit: Payer: Self-pay | Admitting: Hematology & Oncology

## 2015-01-23 DIAGNOSIS — C671 Malignant neoplasm of dome of bladder: Secondary | ICD-10-CM

## 2015-01-25 ENCOUNTER — Ambulatory Visit (HOSPITAL_COMMUNITY): Payer: Medicare Other

## 2015-01-25 ENCOUNTER — Other Ambulatory Visit: Payer: Self-pay | Admitting: Radiology

## 2015-01-26 ENCOUNTER — Ambulatory Visit (HOSPITAL_COMMUNITY)
Admission: RE | Admit: 2015-01-26 | Discharge: 2015-01-26 | Disposition: A | Discharge status: Home / Self Care | Payer: Medicare Other | Source: Ambulatory Visit | Attending: Hematology & Oncology | Admitting: Hematology & Oncology

## 2015-01-26 ENCOUNTER — Encounter (HOSPITAL_COMMUNITY): Payer: Self-pay

## 2015-01-26 ENCOUNTER — Telehealth: Payer: Self-pay | Admitting: Family Medicine

## 2015-01-26 DIAGNOSIS — C775 Secondary and unspecified malignant neoplasm of intrapelvic lymph nodes: Secondary | ICD-10-CM | POA: Diagnosis not present

## 2015-01-26 DIAGNOSIS — C671 Malignant neoplasm of dome of bladder: Secondary | ICD-10-CM

## 2015-01-26 DIAGNOSIS — Z87891 Personal history of nicotine dependence: Secondary | ICD-10-CM | POA: Diagnosis not present

## 2015-01-26 DIAGNOSIS — C679 Malignant neoplasm of bladder, unspecified: Secondary | ICD-10-CM | POA: Diagnosis not present

## 2015-01-26 DIAGNOSIS — I10 Essential (primary) hypertension: Secondary | ICD-10-CM | POA: Insufficient documentation

## 2015-01-26 DIAGNOSIS — R590 Localized enlarged lymph nodes: Secondary | ICD-10-CM | POA: Diagnosis not present

## 2015-01-26 LAB — CBC WITH DIFFERENTIAL/PLATELET
BASOS ABS: 0 10*3/uL (ref 0.0–0.1)
Basophils Relative: 1 % (ref 0–1)
Eosinophils Absolute: 0.1 10*3/uL (ref 0.0–0.7)
Eosinophils Relative: 1 % (ref 0–5)
HEMATOCRIT: 44.3 % (ref 39.0–52.0)
HEMOGLOBIN: 14.3 g/dL (ref 13.0–17.0)
Lymphocytes Relative: 35 % (ref 12–46)
Lymphs Abs: 2 10*3/uL (ref 0.7–4.0)
MCH: 27.7 pg (ref 26.0–34.0)
MCHC: 32.3 g/dL (ref 30.0–36.0)
MCV: 85.9 fL (ref 78.0–100.0)
MONOS PCT: 8 % (ref 3–12)
Monocytes Absolute: 0.5 10*3/uL (ref 0.1–1.0)
Neutro Abs: 3.3 10*3/uL (ref 1.7–7.7)
Neutrophils Relative %: 55 % (ref 43–77)
PLATELETS: 239 10*3/uL (ref 150–400)
RBC: 5.16 MIL/uL (ref 4.22–5.81)
RDW: 14.7 % (ref 11.5–15.5)
WBC: 5.8 10*3/uL (ref 4.0–10.5)

## 2015-01-26 LAB — APTT: aPTT: 31 seconds (ref 24–37)

## 2015-01-26 LAB — PROTIME-INR
INR: 1.02 (ref 0.00–1.49)
Prothrombin Time: 13.5 seconds (ref 11.6–15.2)

## 2015-01-26 MED ORDER — MIDAZOLAM HCL 2 MG/2ML IJ SOLN
INTRAMUSCULAR | Status: AC
  Filled 2015-01-26: qty 6

## 2015-01-26 MED ORDER — FENTANYL CITRATE (PF) 100 MCG/2ML IJ SOLN
INTRAMUSCULAR | Status: AC
Start: 2015-01-26 — End: 2015-01-26
  Filled 2015-01-26: qty 4

## 2015-01-26 MED ORDER — SODIUM CHLORIDE 0.9 % IV SOLN
INTRAVENOUS | Status: DC
  Administered 2015-01-26: 08:00:00 via INTRAVENOUS

## 2015-01-26 MED ORDER — FENTANYL CITRATE (PF) 100 MCG/2ML IJ SOLN
INTRAMUSCULAR | Status: AC | PRN
  Administered 2015-01-26: 50 ug via INTRAVENOUS
  Administered 2015-01-26 (×2): 25 ug via INTRAVENOUS

## 2015-01-26 MED ORDER — MIDAZOLAM HCL 2 MG/2ML IJ SOLN
INTRAMUSCULAR | Status: AC | PRN
  Administered 2015-01-26 (×4): 1 mg via INTRAVENOUS

## 2015-01-26 NOTE — Procedures (Signed)
Successful LT EXTERNAL ILIAC PELVIC NODE BX NO COMP STABLE PATH PENDING FULL REPORT IN PACS

## 2015-01-26 NOTE — H&P (Signed)
Chief Complaint: "I'm having a biopsy"  Referring Physician(s): Ennever,Peter R  History of Present Illness: Dustin Jordan is a 69 y.o. male with history of stage III high-grade urothelial carcinoma of the bladder, status post partial cystectomy with cystoscopy/ICG injection and pelvic lymphadenectomy in May 2015. Recent follow-up CT scan has revealed interval development of pelvic lymphadenopathy, right greater than left consistent with metastatic disease as well as bony erosion of the right iliac bone, partial small bowel obstruction, omental tumor. He presents today for CT-guided biopsy of a pelvic lymph node.  Past Medical History  Diagnosis Date  . Hypertension   . White coat hypertension   . Swelling of right knee joint     s/p arthroscopy 06-08-2013  . Bladder cancer   . Hematuria   . Frequency of urination     Past Surgical History  Procedure Laterality Date  . Knee arthroscopy Right 06-08-2013  . Cystoscopy w/ retrogrades Bilateral 06/22/2013    Procedure: CYSTOSCOPY WITH RETROGRADE PYELOGRAM;  Surgeon: Alexis Frock, MD;  Location: [facility];  Service: Urology;  Laterality: Bilateral;  . Transurethral resection of bladder tumor with gyrus (turbt-gyrus) N/A 06/22/2013    Procedure: TRANSURETHRAL RESECTION OF BLADDER TUMOR WITH GYRUS (TURBT-GYRUS);  Surgeon: Alexis Frock, MD;  Location: [facility];  Service: Urology;  Laterality: N/A;  . Cystoscopy with retrograde pyelogram, ureteroscopy and stent placement Bilateral 01/02/2014    Procedure: CYSTOSCOPY WITH RETROGRADE PYELOGRAM BILATERAL URETERS  AND TRANSURETHRAL RESECTION OF A BLADDER TUMOR WITH MULTIPLE BX;  Surgeon: Alexis Frock, MD;  Location: [facility];  Service: Urology;  Laterality: Bilateral;  . Robot assisted laparoscopic complete cystect ileal conduit N/A 02/17/2014    Procedure: ROBOTIC ASSISTED LAPAROSCOPIC PARTIAL CYSTECTOMY;  Surgeon: Alexis Frock, MD;  Location: [facility];   Service: Urology;  Laterality: N/A;  . Lymphadenectomy Bilateral 02/17/2014    Procedure: BILATERAL LYMPH NODE DISSECTION;  Surgeon: Alexis Frock, MD;  Location: [facility];  Service: Urology;  Laterality: Bilateral;  . Cystoscopy N/A 02/17/2014    Procedure: CYSTOSCOPY WITH INDOCYANINE GREEN DYE;  Surgeon: Alexis Frock, MD;  Location: [facility];  Service: Urology;  Laterality: N/A;    Allergies: Review of patient's allergies indicates no known allergies.  Medications: Prior to Admission medications   Medication Sig Start Date End Date Taking? Authorizing Provider  hydrochlorothiazide (MICROZIDE) 12.5 MG capsule Take 1 capsule (12.5 mg total) by mouth every morning. 04/25/14  Yes Leandrew Koyanagi, MD    Family History  Problem Relation Age of Onset  . Thyroid disease Neg Hx     History   Social History  . Marital Status: Single    Spouse Name: N/A  . Number of Children: N/A  . Years of Education: N/A   Occupational History  . Driver      part time   Social History Main Topics  . Smoking status: Former Smoker -- 1.00 packs/day for 20 years    Types: Cigarettes    Quit date: 06/15/2010  . Smokeless tobacco: Never Used  . Alcohol Use: No  . Drug Use: No  . Sexual Activity: Not on file   Other Topics Concern  . None   Social History Narrative      Review of Systems  Constitutional: Negative for fever and chills.  Respiratory: Negative for cough and shortness of breath.   Cardiovascular: Negative for chest pain.  Gastrointestinal: Positive for abdominal pain. Negative for nausea, vomiting and blood in stool.  Genitourinary: Negative  for dysuria.       Prior hematuria  Musculoskeletal: Positive for back pain.  Neurological: Negative for headaches.    Vital Signs: BP 173/99 mmHg  Pulse 61  Temp(Src) 97.7 F (36.5 C) (Oral)  Resp 18  Ht 6' 1.5" (1.867 m)  Wt 191 lb 12 oz (86.977 kg)  BMI 24.95 kg/m2  SpO2 100%  Physical Exam  Constitutional: He is oriented  to person, place, and time. He appears well-developed and well-nourished.  Cardiovascular: Normal rate and regular rhythm.   Pulmonary/Chest: Effort normal and breath sounds normal.  Abdominal: Soft. Bowel sounds are normal. There is no tenderness.  Musculoskeletal: Normal range of motion. He exhibits no edema.  Neurological: He is alert and oriented to person, place, and time.    Imaging: Ct Abdomen Pelvis W Contrast  01/19/2015   CLINICAL DATA:  Bladder cancer. Partial cystectomy. Now with bloating and hematuria.  EXAM: CT ABDOMEN AND PELVIS WITH CONTRAST  TECHNIQUE: Multidetector CT imaging of the abdomen and pelvis was performed using the standard protocol following bolus administration of intravenous contrast.  CONTRAST:  116mL OMNIPAQUE IOHEXOL 300 MG/ML  SOLN  COMPARISON:  CT 06/22/2013, 01/01/2014, 02/20/2014  FINDINGS: Lung bases are clear. Bochdalek's hernia right posterior hemidiaphragm unchanged from prior studies. Heart size is normal. Negative for hiatal hernia  Liver and spleen are homogeneous and normal. Gallbladder and bile ducts normal. Pancreas normal.  Kidneys are normal. No renal obstruction or mass. No filling defect in the renal pelvis on delayed imaging.  Partial cystectomy. Small urinary bladder without obvious mass lesion. No bladder stone identified. Prostate is moderately enlarged.  Proximal small bowel mildly dilated measuring up to 31 mm in the jejunum. A transition point in the mid small bowel were there is tumor in the omentum causing obstruction. Terminal ileum decompressed. Colon nondilated.  Pelvic adenopathy is present right greater than left. Enlarged right external iliac lymph nodes with a lymph node mass measuring 7 x 3.5 cm. Enlarged right obturator lymph node with adjacent bony erosion of the right iliac bone due to direct bony invasion of tumor. Tumor is present in the omentum . Left external iliac lymph node mass measures 37 x 18 mm. 15 mm left common iliac lymph  nodes are enlarged as well. Small amount of fluid in the lateral pelvis anteriorly  IMPRESSION: Interval development of pelvic adenopathy right greater than left consistent with metastatic disease. There is bony erosion of the right iliac bone compatible with tumor.  Partial small bowel obstruction. There is tumor in the omentum and there is a transition point in the mid small bowel compatible with bowel obstruction due to tumor.  These results will be called to the ordering clinician or representative by the Radiologist Assistant, and communication documented in the PACS or zVision Dashboard.   Electronically Signed   By: Franchot Gallo M.D.   On: 01/19/2015 15:19   Dg Abd Acute W/chest  01/17/2015   CLINICAL DATA:  Bloated feeling  EXAM: DG ABDOMEN ACUTE W/ 1V CHEST  COMPARISON:  03/19/2014, chest x-ray 01/01/2014  FINDINGS: Heart size and vascularity normal. Lungs are clear without infiltrate or effusion or mass  Nonobstructive bowel gas pattern. Air-fluid levels in the epigastric area in the duodenum and proximal small bowel. Colon decompressed. No free air. No renal calculi  IMPRESSION: No active cardiopulmonary disease  Air-fluid levels in the proximal small bowel may reflect ileus or proximal small bowel obstruction. Correlate with any symptoms of vomiting.   Electronically Signed  By: Franchot Gallo M.D.   On: 01/17/2015 11:10   Dg Hip Unilat With Pelvis 2-3 Views Right  12/27/2014   CLINICAL DATA:  Hip pain of unknown origin.  Initial encounter.  EXAM: RIGHT HIP (WITH PELVIS) 2-3 VIEWS  COMPARISON:  Cystogram 02/27/2014.  Pelvic CT 02/20/2014.  FINDINGS: The mineralization and alignment are normal. There is no evidence of acute fracture or dislocation. There is no evidence of femoral head avascular necrosis. Left-greater-than-right hip degenerative changes are stable compared with prior CT. Bladder catheter has been removed. Bilateral pelvic phleboliths are grossly stable.  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 No acute osseous findings.   Electronically Signed   By: Richardean Sale M.D.   On: 12/27/2014 16:26    Labs:  CBC:  Recent Labs  02/20/14 1110 02/21/14 0353 04/25/14 1820 01/17/15 1046 01/26/15 0738  WBC 12.5* 11.3* 6.8 7.9 5.8  HGB 12.4* 11.1* 11.8* 15.1 14.3  HCT 39.0 35.0* 36.4* 46.0 44.3  PLT 281 255 278  --  239    COAGS:  Recent Labs  01/26/15 0738  INR 1.02  APTT 31    BMP:  Recent Labs  02/20/14 1110 02/21/14 0353 02/22/14 0500 04/25/14 1820 09/18/14 1433 01/19/15 1210  NA 134* 133* 134* 137 137 138  K 5.4* 4.9 4.4 4.5 4.0 3.9  CL 98 98 95* 106 102 102  CO2 26 25 26 25 27 27   GLUCOSE 131* 116* 98 90 78 92  BUN 24* 26* 28* 20 18 14   CALCIUM 10.2 9.6 9.8 9.4 10.0 9.8  CREATININE 2.87* 2.76* 1.59* 1.22 1.04 1.14  GFRNONAA 21* 22* 43*  --   --  64*  GFRAA 25* 26* 50*  --   --  74*    LIVER FUNCTION TESTS:  Recent Labs  02/21/14 0353 04/25/14 1820 09/18/14 1433  BILITOT 0.7 0.3 0.4  AST 11 22 26   ALT 8 20 21   ALKPHOS 51 81 81  PROT 7.0 7.6 8.3  ALBUMIN 2.8* 4.0 4.3    TUMOR MARKERS: No results for input(s): AFPTM, CEA, CA199, CHROMGRNA in the last 8760 hours.  Assessment and Plan: Dustin Jordan is a 69 y.o. male with history of stage III high-grade urothelial carcinoma of the bladder, status post partial cystectomy with cystoscopy/ICG injection and pelvic lymphadenectomy in May 2015. Recent follow-up CT scan has revealed interval development of pelvic lymphadenopathy, right greater than left consistent with metastatic disease as well as bony erosion of the right iliac bone, partial small bowel obstruction, omental tumor. He presents today for CT-guided biopsy of a pelvic lymph node.Risks and benefits discussed with the patient/wife including, but not limited to bleeding, infection, damage to adjacent structures or low yield requiring additional tests.All of the patient's questions were  answered, patient is agreeable to proceed. Consent signed and in chart.     Signed: Autumn Messing 01/26/2015, 8:33 AM   I spent a total of 20 minutes face to face in clinical consultation, greater than 50% of which was counseling/coordinating care for CT-guided pelvic lymph node biopsy

## 2015-01-26 NOTE — Discharge Instructions (Signed)
Needle Biopsy °Care After °These instructions give you information on caring for yourself after your procedure. Your doctor may also give you more specific instructions. Call your doctor if you have any problems or questions after your procedure. °HOME CARE °· Rest for 4 hours after your biopsy, except for getting up to go to the bathroom or as told. °· Keep the places where the needles were put in clean and dry. °¨ Do not put powder or lotion on the sites. °¨ Do not shower until 24 hours after the test. Remove all bandages (dressings) before showering. °¨ Remove all bandages at least once every day. Gently clean the sites with soap and water. Keep putting a new bandage on until the skin is closed. °Finding out the results of your test °Ask your doctor when your test results will be ready. Make sure you follow up and get the test results. °GET HELP RIGHT AWAY IF:  °· You have shortness of breath or trouble breathing. °· You have pain or cramping in your belly (abdomen). °· You feel sick to your stomach (nauseous) or throw up (vomit). °· Any of the places where the needles were put in: °¨ Are puffy (swollen) or red. °¨ Are sore or hot to the touch. °¨ Are draining yellowish-white fluid (pus). °¨ Are bleeding after 10 minutes of pressing down on the site. Have someone keep pressing on any place that is bleeding until you see a doctor. °· You have any unusual pain that will not stop. °· You have a fever. °If you go to the emergency room, tell the nurse that you had a biopsy. Take this paper with you to show the nurse. °MAKE SURE YOU:  °· Understand these instructions. °· Will watch your condition. °· Will get help right away if you are not doing well or get worse. °Document Released: 08/28/2008 Document Revised: 12/08/2011 Document Reviewed: 08/28/2008 °ExitCare® Patient Information ©2015 [employer]. This information is not intended to replace advice given to you by your health care provider. Make sure you discuss  any questions you have with your health care provider. °Conscious Sedation °Sedation is the use of medicines to promote relaxation and relieve discomfort and anxiety. Conscious sedation is a type of sedation. Under conscious sedation you are less alert than normal but are still able to respond to instructions or stimulation. Conscious sedation is used during short medical and dental procedures. It is milder than deep sedation or general anesthesia and allows you to return to your regular activities sooner.  °LET YOUR HEALTH CARE PROVIDER KNOW ABOUT:  °· Any allergies you have. °· All medicines you are taking, including vitamins, herbs, eye drops, creams, and over-the-counter medicines. °· Use of steroids (by mouth or creams). °· Previous problems you or members of your family have had with the use of anesthetics. °· Any blood disorders you have. °· Previous surgeries you have had. °· Medical conditions you have. °· Possibility of pregnancy, if this applies. °· Use of cigarettes, alcohol, or illegal drugs. °RISKS AND COMPLICATIONS °Generally, this is a safe procedure. However, as with any procedure, problems can occur. Possible problems include: °· Oversedation. °· Trouble breathing on your own. You may need to have a breathing tube until you are awake and breathing on your own. °· Allergic reaction to any of the medicines used for the procedure. °BEFORE THE PROCEDURE °· You may have blood tests done. These tests can help show how well your kidneys and liver are working. They can also   show how well your blood clots. °· A physical exam will be done.   °· Only take medicines as directed by your health care provider. You may need to stop taking medicines (such as blood thinners, aspirin, or nonsteroidal anti-inflammatory drugs) before the procedure.   °· Do not eat or drink at least 6 hours before the procedure or as directed by your health care provider. °· Arrange for a responsible adult, family member, or friend to  take you home after the procedure. He or she should stay with you for at least 24 hours after the procedure, until the medicine has worn off. °PROCEDURE  °· An intravenous (IV) catheter will be inserted into one of your veins. Medicine will be able to flow directly into your body through this catheter. You may be given medicine through this tube to help prevent pain and help you relax. °· The medical or dental procedure will be done. °AFTER THE PROCEDURE °· You will stay in a recovery area until the medicine has worn off. Your blood pressure and pulse will be checked.   °·  Depending on the procedure you had, you may be allowed to go home when you can tolerate liquids and your pain is under control. °Document Released: 06/10/2001 Document Revised: 09/20/2013 Document Reviewed: 05/23/2013 °ExitCare® Patient Information ©2015 [employer]. This information is not intended to replace advice given to you by your health care provider. Make sure you discuss any questions you have with your health care provider. ° °

## 2015-01-26 NOTE — Telephone Encounter (Signed)
Patient wanting a copy of labs xray and ct report left copy up front for patient

## 2015-01-30 ENCOUNTER — Telehealth: Payer: Self-pay | Admitting: Hematology & Oncology

## 2015-01-30 ENCOUNTER — Ambulatory Visit (HOSPITAL_COMMUNITY)
Admission: RE | Admit: 2015-01-30 | Discharge: 2015-01-30 | Disposition: A | Discharge status: Home / Self Care | Payer: Medicare Other | Source: Ambulatory Visit | Attending: Hematology & Oncology | Admitting: Hematology & Oncology

## 2015-01-30 DIAGNOSIS — C671 Malignant neoplasm of dome of bladder: Secondary | ICD-10-CM | POA: Insufficient documentation

## 2015-01-30 DIAGNOSIS — D4989 Neoplasm of unspecified behavior of other specified sites: Secondary | ICD-10-CM | POA: Diagnosis not present

## 2015-01-30 DIAGNOSIS — C679 Malignant neoplasm of bladder, unspecified: Secondary | ICD-10-CM | POA: Diagnosis not present

## 2015-01-30 DIAGNOSIS — C7951 Secondary malignant neoplasm of bone: Secondary | ICD-10-CM | POA: Diagnosis not present

## 2015-01-30 LAB — GLUCOSE, CAPILLARY: Glucose-Capillary: 81 mg/dL (ref 70–99)

## 2015-01-30 MED ORDER — FLUDEOXYGLUCOSE F - 18 (FDG) INJECTION
9.0000 | Freq: Once | INTRAVENOUS | Status: AC | PRN
  Administered 2015-01-30: 9 via INTRAVENOUS

## 2015-01-30 NOTE — Telephone Encounter (Signed)
I left a message on Dustin Jordan cell phone. I told him that his bladder cancer had come back. I told him that we can treat this. We just cannot cure this.  I told him that office will call him so we can get him in and talk to him about the options that are available for treatment. It will be chemotherapy.  If he has any questions he can always give [place] a call.  He has a strong faith. We will be able to use that faith to improve his quality of life.  pete

## 2015-02-01 ENCOUNTER — Telehealth: Payer: Self-pay | Admitting: Hematology & Oncology

## 2015-02-01 NOTE — Telephone Encounter (Signed)
Left vm w NEW PATIENT today to remind them of their appointment with Dr. Ennever. Also, advised them to bring all medication bottles and insurance card information. ° °

## 2015-02-02 ENCOUNTER — Ambulatory Visit (HOSPITAL_BASED_OUTPATIENT_CLINIC_OR_DEPARTMENT_OTHER): Payer: Medicare Other | Admitting: Hematology & Oncology

## 2015-02-02 ENCOUNTER — Ambulatory Visit: Payer: Medicare Other

## 2015-02-02 ENCOUNTER — Other Ambulatory Visit (HOSPITAL_BASED_OUTPATIENT_CLINIC_OR_DEPARTMENT_OTHER): Payer: Medicare Other

## 2015-02-02 ENCOUNTER — Encounter: Payer: Self-pay | Admitting: Hematology & Oncology

## 2015-02-02 VITALS — BP 143/73 | HR 86 | Temp 98.1°F | Resp 18 | Ht 74.0 in | Wt 184.0 lb

## 2015-02-02 DIAGNOSIS — C786 Secondary malignant neoplasm of retroperitoneum and peritoneum: Secondary | ICD-10-CM | POA: Diagnosis not present

## 2015-02-02 DIAGNOSIS — C679 Malignant neoplasm of bladder, unspecified: Secondary | ICD-10-CM | POA: Diagnosis not present

## 2015-02-02 DIAGNOSIS — C775 Secondary and unspecified malignant neoplasm of intrapelvic lymph nodes: Secondary | ICD-10-CM

## 2015-02-02 DIAGNOSIS — C671 Malignant neoplasm of dome of bladder: Secondary | ICD-10-CM | POA: Diagnosis not present

## 2015-02-02 HISTORY — DX: Malignant neoplasm of bladder, unspecified: C67.9

## 2015-02-02 LAB — CBC WITH DIFFERENTIAL (CANCER CENTER ONLY)
BASO#: 0 10*3/uL (ref 0.0–0.2)
BASO%: 0.3 % (ref 0.0–2.0)
EOS%: 0.2 % (ref 0.0–7.0)
Eosinophils Absolute: 0 10*3/uL (ref 0.0–0.5)
HCT: 44.1 % (ref 38.7–49.9)
HGB: 14.9 g/dL (ref 13.0–17.1)
LYMPH#: 1.5 10*3/uL (ref 0.9–3.3)
LYMPH%: 25.3 % (ref 14.0–48.0)
MCH: 28.4 pg (ref 28.0–33.4)
MCHC: 33.8 g/dL (ref 32.0–35.9)
MCV: 84 fL (ref 82–98)
MONO#: 0.8 10*3/uL (ref 0.1–0.9)
MONO%: 12.7 % (ref 0.0–13.0)
NEUT%: 61.5 % (ref 40.0–80.0)
NEUTROS ABS: 3.6 10*3/uL (ref 1.5–6.5)
Platelets: 249 10*3/uL (ref 145–400)
RBC: 5.25 10*6/uL (ref 4.20–5.70)
RDW: 14.4 % (ref 11.1–15.7)
WBC: 5.9 10*3/uL (ref 4.0–10.0)

## 2015-02-02 LAB — CMP (CANCER CENTER ONLY)
ALT(SGPT): 15 U/L (ref 10–47)
AST: 25 U/L (ref 11–38)
Albumin: 4.1 g/dL (ref 3.3–5.5)
Alkaline Phosphatase: 68 U/L (ref 26–84)
BUN, Bld: 16 mg/dL (ref 7–22)
CHLORIDE: 105 meq/L (ref 98–108)
CO2: 26 meq/L (ref 18–33)
Calcium: 9.7 mg/dL (ref 8.0–10.3)
Creat: 1.2 mg/dl (ref 0.6–1.2)
GLUCOSE: 102 mg/dL (ref 73–118)
POTASSIUM: 3.9 meq/L (ref 3.3–4.7)
Sodium: 143 mEq/L (ref 128–145)
Total Bilirubin: 0.7 mg/dl (ref 0.20–1.60)
Total Protein: 9.1 g/dL — ABNORMAL HIGH (ref 6.4–8.1)

## 2015-02-02 LAB — LACTATE DEHYDROGENASE: LDH: 160 U/L (ref 94–250)

## 2015-02-02 NOTE — Progress Notes (Signed)
Hematology and Oncology Follow Up Visit  LORAIN KEAST [id number] [birth year] 69 y.o. 02/02/2015   Principle Diagnosis:   Metastatic bladder cancer  Current Therapy:    Patient start chemotherapy with carboplatin/Gemzar on May 16     Interim History:  Mr. Faro is in for his first office visit. I saw him in consultation at [facility] on April 22. At that point, he had some partial bowel obstruction. He seemed to be eaten pretty well.  He had a CT scan done which showed extensive pelvic adenopathy.  He did have a bladder cancer removed last year. It was poorly differentiated.  He was originally discharged. He had a biopsy done as an outpatient. The pathology report ([id number]) showed metastatic bladder cancer.  He had a PET scan done. The PET scan showed extensive metabolic disease in the pelvic lymph nodes and peritoneal implants. There was right pelvic adenopathy with possible osseous erosion along the right sacroiliac joint. There was indeterminate activity in a porta hepatitis noted. There is no thoracic disease. There is no hepatic disease.  He looks quite good. He feels good. He is having no problems with pain. He is not eating a lot. He is afraid of getting sick. I recommended that he take protein shakes 3 or 4 times a day that will provide protein and calories.  I showed him and his daughter the PET scan results.  He's had no problems with bowels. He's had no bleeding. He's had no leg swelling. There is no cough or shortness of breath.  Overall, his performance status is ECOG 1.  Medications:  Current outpatient prescriptions:  .  hydrochlorothiazide (MICROZIDE) 12.5 MG capsule, Take 1 capsule (12.5 mg total) by mouth every morning., Disp: 90 capsule, Rfl: 3  Allergies: No Known Allergies  Past Medical History, Surgical history, Social history, and Family History were reviewed and updated.  Review of Systems: As above  Physical Exam:  height is 6\' 2"  (1.88  m) and weight is 184 lb (83.462 kg). His oral temperature is 98.1 F (36.7 C). His blood pressure is 143/73 and his pulse is 86. His respiration is 18.   Wt Readings from Last 3 Encounters:  02/02/15 184 lb (83.462 kg)  01/19/15 191 lb 12.8 oz (87 kg)  01/19/15 193 lb (87.544 kg)     Well-developed and well-nourished African-American gentleman. Head and neck exam shows no ocular or oral lesions. There are no palpable cervical or supraclavicular lymph nodes. Lungs are clear. Cardiac exam regular rate and rhythm with no murmurs, rubs or bruits. Abdomen is soft. He has good bowel sounds. There is no fluid wave. There is no palpable abdominal mass. There is no palpable liver or spleen tip. Back exam shows no tenderness over the spine, ribs or hips. External shows no clubbing, cyanosis or edema. Neurological exam is nonfocal.  Lab Results  Component Value Date   WBC 5.9 02/02/2015   HGB 14.9 02/02/2015   HCT 44.1 02/02/2015   MCV 84 02/02/2015   PLT 249 02/02/2015     Chemistry      Component Value Date/Time   NA 143 02/02/2015 1420   NA 138 01/19/2015 1210   K 3.9 02/02/2015 1420   K 3.9 01/19/2015 1210   CL 105 02/02/2015 1420   CL 102 01/19/2015 1210   CO2 26 02/02/2015 1420   CO2 27 01/19/2015 1210   BUN 16 02/02/2015 1420   BUN 14 01/19/2015 1210   CREATININE 1.2 02/02/2015 1420  CREATININE 1.14 01/19/2015 1210      Component Value Date/Time   CALCIUM 9.7 02/02/2015 1420   CALCIUM 9.8 01/19/2015 1210   ALKPHOS 68 02/02/2015 1420   ALKPHOS 81 09/18/2014 1433   AST 25 02/02/2015 1420   AST 26 09/18/2014 1433   ALT 15 02/02/2015 1420   ALT 21 09/18/2014 1433   BILITOT 0.70 02/02/2015 1420   BILITOT 0.4 09/18/2014 1433         Impression and Plan: Mr. Walla is 69 year old African-American male with metastatic bladder cancer. This is a poorly differentiated.  This is certainly a difficult problem. The fact that this has recurred about a year since his original  surgery might be construed as a poor risk factor.  I think that he would be a good candidate for systemic chemotherapy.  I talked to he and his daughter. I told them that the cancer can be treated but cannot be cured since it is metastatic. They do understand this.  I feel that he could tolerate carboplatin/Gemzar. I think this would be reasonable for front-line therapy.  I went over the side effects with him and his daughter. I gave them the information sheets about each medicine. I told them about the possibility of hair loss. I told him about risk of infection. I told him about nausea and vomiting, diarrhea, fatigue, bleeding. He understands all this. He wishes to proceed.  We will go ahead get things set up for May 16.  After his second cycle of treatment, I will then plan to repeat his PET scan.  I spent about 50 minutes with he and his daughter. I answered all their questions.  I will plan to see him back when he starts his second cycle of treatment.   [name], MD 5/6/20164:44 PM

## 2015-02-02 NOTE — Patient Instructions (Addendum)
Carboplatin injection What is this medicine? CARBOPLATIN (KAR boe pla tin) is a chemotherapy drug. It targets fast dividing cells, like cancer cells, and causes these cells to die. This medicine is used to treat ovarian cancer and many other cancers. This medicine may be used for other purposes; ask your health care provider or pharmacist if you have questions. COMMON BRAND NAME(S): Paraplatin What should I tell my health care provider before I take this medicine? They need to know if you have any of these conditions: -blood disorders -hearing problems -kidney disease -recent or ongoing radiation therapy -an unusual or allergic reaction to carboplatin, cisplatin, other chemotherapy, other medicines, foods, dyes, or preservatives -pregnant or trying to get pregnant -breast-feeding How should I use this medicine? This drug is usually given as an infusion into a vein. It is administered in a hospital or clinic by a specially trained health care professional. Talk to your pediatrician regarding the use of this medicine in children. Special care may be needed. Overdosage: If you think you have taken too much of this medicine contact a poison control center or emergency room at once. NOTE: This medicine is only for you. Do not share this medicine with others. What if I miss a dose? It is important not to miss a dose. Call your doctor or health care professional if you are unable to keep an appointment. What may interact with this medicine? -medicines for seizures -medicines to increase blood counts like filgrastim, pegfilgrastim, sargramostim -some antibiotics like amikacin, gentamicin, neomycin, streptomycin, tobramycin -vaccines Talk to your doctor or health care professional before taking any of these medicines: -acetaminophen -aspirin -ibuprofen -ketoprofen -naproxen This list may not describe all possible interactions. Give your health care provider a list of all the medicines, herbs,  non-prescription drugs, or dietary supplements you use. Also tell them if you smoke, drink alcohol, or use illegal drugs. Some items may interact with your medicine. What should I watch for while using this medicine? Your condition will be monitored carefully while you are receiving this medicine. You will need important blood work done while you are taking this medicine. This drug may make you feel generally unwell. This is not uncommon, as chemotherapy can affect healthy cells as well as cancer cells. Report any side effects. Continue your course of treatment even though you feel ill unless your doctor tells you to stop. In some cases, you may be given additional medicines to help with side effects. Follow all directions for their use. Call your doctor or health care professional for advice if you get a fever, chills or sore throat, or other symptoms of a cold or flu. Do not treat yourself. This drug decreases your body's ability to fight infections. Try to avoid being around people who are sick. This medicine may increase your risk to bruise or bleed. Call your doctor or health care professional if you notice any unusual bleeding. Be careful brushing and flossing your teeth or using a toothpick because you may get an infection or bleed more easily. If you have any dental work done, tell your dentist you are receiving this medicine. Avoid taking products that contain aspirin, acetaminophen, ibuprofen, naproxen, or ketoprofen unless instructed by your doctor. These medicines may hide a fever. Do not become pregnant while taking this medicine. Women should inform their doctor if they wish to become pregnant or think they might be pregnant. There is a potential for serious side effects to an unborn child. Talk to your health care professional or   pharmacist for more information. Do not breast-feed an infant while taking this medicine. What side effects may I notice from receiving this medicine? Side effects  that you should report to your doctor or health care professional as soon as possible: -allergic reactions like skin rash, itching or hives, swelling of the face, lips, or tongue -signs of infection - fever or chills, cough, sore throat, pain or difficulty passing urine -signs of decreased platelets or bleeding - bruising, pinpoint red spots on the skin, black, tarry stools, nosebleeds -signs of decreased red blood cells - unusually weak or tired, fainting spells, lightheadedness -breathing problems -changes in hearing -changes in vision -chest pain -high blood pressure -low blood counts - This drug may decrease the number of white blood cells, red blood cells and platelets. You may be at increased risk for infections and bleeding. -nausea and vomiting -pain, swelling, redness or irritation at the injection site -pain, tingling, numbness in the hands or feet -problems with balance, talking, walking -trouble passing urine or change in the amount of urine Side effects that usually do not require medical attention (report to your doctor or health care professional if they continue or are bothersome): -hair loss -loss of appetite -metallic taste in the mouth or changes in taste This list may not describe all possible side effects. Call your doctor for medical advice about side effects. You may report side effects to FDA at 1-800-FDA-1088. Where should I keep my medicine? This drug is given in a hospital or clinic and will not be stored at home. NOTE: This sheet is a summary. It may not cover all possible information. If you have questions about this medicine, talk to your doctor, pharmacist, or health care provider.  2015, Elsevier/Gold Standard. (2007-12-21 14:38:05) Gemcitabine injection What is this medicine? GEMCITABINE (jem SIT a been) is a chemotherapy drug. This medicine is used to treat many types of cancer like breast cancer, lung cancer, pancreatic cancer, and ovarian cancer. This  medicine may be used for other purposes; ask your health care provider or pharmacist if you have questions. COMMON BRAND NAME(S): Gemzar What should I tell my health care provider before I take this medicine? They need to know if you have any of these conditions: -blood disorders -infection -kidney disease -liver disease -recent or ongoing radiation therapy -an unusual or allergic reaction to gemcitabine, other chemotherapy, other medicines, foods, dyes, or preservatives -pregnant or trying to get pregnant -breast-feeding How should I use this medicine? This drug is given as an infusion into a vein. It is administered in a hospital or clinic by a specially trained health care professional. Talk to your pediatrician regarding the use of this medicine in children. Special care may be needed. Overdosage: If you think you have taken too much of this medicine contact a poison control center or emergency room at once. NOTE: This medicine is only for you. Do not share this medicine with others. What if I miss a dose? It is important not to miss your dose. Call your doctor or health care professional if you are unable to keep an appointment. What may interact with this medicine? -medicines to increase blood counts like filgrastim, pegfilgrastim, sargramostim -some other chemotherapy drugs like cisplatin -vaccines Talk to your doctor or health care professional before taking any of these medicines: -acetaminophen -aspirin -ibuprofen -ketoprofen -naproxen This list may not describe all possible interactions. Give your health care provider a list of all the medicines, herbs, non-prescription drugs, or dietary supplements you use. Also   tell them if you smoke, drink alcohol, or use illegal drugs. Some items may interact with your medicine. What should I watch for while using this medicine? Visit your doctor for checks on your progress. This drug may make you feel generally unwell. This is not  uncommon, as chemotherapy can affect healthy cells as well as cancer cells. Report any side effects. Continue your course of treatment even though you feel ill unless your doctor tells you to stop. In some cases, you may be given additional medicines to help with side effects. Follow all directions for their use. Call your doctor or health care professional for advice if you get a fever, chills or sore throat, or other symptoms of a cold or flu. Do not treat yourself. This drug decreases your body's ability to fight infections. Try to avoid being around people who are sick. This medicine may increase your risk to bruise or bleed. Call your doctor or health care professional if you notice any unusual bleeding. Be careful brushing and flossing your teeth or using a toothpick because you may get an infection or bleed more easily. If you have any dental work done, tell your dentist you are receiving this medicine. Avoid taking products that contain aspirin, acetaminophen, ibuprofen, naproxen, or ketoprofen unless instructed by your doctor. These medicines may hide a fever. Women should inform their doctor if they wish to become pregnant or think they might be pregnant. There is a potential for serious side effects to an unborn child. Talk to your health care professional or pharmacist for more information. Do not breast-feed an infant while taking this medicine. What side effects may I notice from receiving this medicine? Side effects that you should report to your doctor or health care professional as soon as possible: -allergic reactions like skin rash, itching or hives, swelling of the face, lips, or tongue -low blood counts - this medicine may decrease the number of white blood cells, red blood cells and platelets. You may be at increased risk for infections and bleeding. -signs of infection - fever or chills, cough, sore throat, pain or difficulty passing urine -signs of decreased platelets or bleeding  - bruising, pinpoint red spots on the skin, black, tarry stools, blood in the urine -signs of decreased red blood cells - unusually weak or tired, fainting spells, lightheadedness -breathing problems -chest pain -mouth sores -nausea and vomiting -pain, swelling, redness at site where injected -pain, tingling, numbness in the hands or feet -stomach pain -swelling of ankles, feet, hands -unusual bleeding Side effects that usually do not require medical attention (report to your doctor or health care professional if they continue or are bothersome): -constipation -diarrhea -hair loss -loss of appetite -stomach upset This list may not describe all possible side effects. Call your doctor for medical advice about side effects. You may report side effects to FDA at 1-800-FDA-1088. Where should I keep my medicine? This drug is given in a hospital or clinic and will not be stored at home. NOTE: This sheet is a summary. It may not cover all possible information. If you have questions about this medicine, talk to your doctor, pharmacist, or health care provider.  2015, Elsevier/Gold Standard. (2008-01-25 18:45:54)  

## 2015-02-06 ENCOUNTER — Encounter: Payer: Self-pay | Admitting: *Deleted

## 2015-02-06 ENCOUNTER — Other Ambulatory Visit: Payer: Medicare Other

## 2015-02-08 ENCOUNTER — Other Ambulatory Visit: Payer: Self-pay | Admitting: Nurse Practitioner

## 2015-02-08 DIAGNOSIS — C775 Secondary and unspecified malignant neoplasm of intrapelvic lymph nodes: Principal | ICD-10-CM

## 2015-02-08 DIAGNOSIS — C679 Malignant neoplasm of bladder, unspecified: Secondary | ICD-10-CM

## 2015-02-08 MED ORDER — LORAZEPAM 0.5 MG PO TABS: 0.5000 mg | ORAL_TABLET | Freq: Four times a day (QID) | ORAL | Status: DC | PRN

## 2015-02-08 MED ORDER — LIDOCAINE-PRILOCAINE 2.5-2.5 % EX CREA: TOPICAL_CREAM | CUTANEOUS | Status: DC

## 2015-02-08 MED ORDER — DEXAMETHASONE 4 MG PO TABS: 8.0000 mg | ORAL_TABLET | Freq: Two times a day (BID) | ORAL | Status: DC

## 2015-02-08 MED ORDER — PROCHLORPERAZINE MALEATE 10 MG PO TABS: 10.0000 mg | ORAL_TABLET | Freq: Four times a day (QID) | ORAL | Status: DC | PRN

## 2015-02-08 MED ORDER — ONDANSETRON HCL 8 MG PO TABS: 8.0000 mg | ORAL_TABLET | Freq: Two times a day (BID) | ORAL | Status: DC

## 2015-02-12 ENCOUNTER — Ambulatory Visit (HOSPITAL_BASED_OUTPATIENT_CLINIC_OR_DEPARTMENT_OTHER): Payer: Medicare Other

## 2015-02-12 ENCOUNTER — Other Ambulatory Visit: Payer: Medicare Other

## 2015-02-12 VITALS — BP 128/79 | HR 64 | Temp 97.7°F

## 2015-02-12 DIAGNOSIS — C679 Malignant neoplasm of bladder, unspecified: Secondary | ICD-10-CM

## 2015-02-12 DIAGNOSIS — C775 Secondary and unspecified malignant neoplasm of intrapelvic lymph nodes: Secondary | ICD-10-CM

## 2015-02-12 DIAGNOSIS — C786 Secondary malignant neoplasm of retroperitoneum and peritoneum: Secondary | ICD-10-CM

## 2015-02-12 DIAGNOSIS — Z5111 Encounter for antineoplastic chemotherapy: Secondary | ICD-10-CM

## 2015-02-12 MED ORDER — SODIUM CHLORIDE 0.9 % IV SOLN
Freq: Once | INTRAVENOUS | Status: AC
  Administered 2015-02-12: 10:00:00 via INTRAVENOUS

## 2015-02-12 MED ORDER — SODIUM CHLORIDE 0.9 % IV SOLN
1000.0000 mg/m2 | Freq: Once | INTRAVENOUS | Status: AC
  Administered 2015-02-12: 2090 mg via INTRAVENOUS
  Filled 2015-02-12: qty 54.97

## 2015-02-12 MED ORDER — SODIUM CHLORIDE 0.9 % IV SOLN
470.0000 mg | Freq: Once | INTRAVENOUS | Status: AC
  Administered 2015-02-12: 470 mg via INTRAVENOUS
  Filled 2015-02-12: qty 47

## 2015-02-12 MED ORDER — SODIUM CHLORIDE 0.9 % IV SOLN
Freq: Once | INTRAVENOUS | Status: AC
  Administered 2015-02-12: 10:00:00 via INTRAVENOUS
  Filled 2015-02-12: qty 8

## 2015-02-12 NOTE — Patient Instructions (Addendum)
[facility] Discharge Instructions for Patients Receiving Chemotherapy  Today you received the following chemotherapy agents Carboplatin, Gemzar  To help prevent nausea and vomiting after your treatment, we encourage you to take your nausea medication  1) Zofran 8 mg.  Take one tablet by mouth twice daily once in am and once in pm beginning day after chemotherapy for  3 days.  2) Dexamethasone 4mg .  Take 2 tablets twice daily.  Take once in am and once in pm beginning day after chemotherapy for 3 days.    AS NEEDED Compazine 10 mg - Take 1 tablet by mouth every 6 hours as needed for nausea and vomiting.   If you develop nausea and vomiting that is not controlled by your nausea medication, call the clinic.   BELOW ARE SYMPTOMS THAT SHOULD BE REPORTED IMMEDIATELY:  *FEVER GREATER THAN 100.5 F  *CHILLS WITH OR WITHOUT FEVER  NAUSEA AND VOMITING THAT IS NOT CONTROLLED WITH YOUR NAUSEA MEDICATION  *UNUSUAL SHORTNESS OF BREATH  *UNUSUAL BRUISING OR BLEEDING  TENDERNESS IN MOUTH AND THROAT WITH OR WITHOUT PRESENCE OF ULCERS  *URINARY PROBLEMS  *BOWEL PROBLEMS  UNUSUAL RASH Items with * indicate a potential emergency and should be followed up as soon as possible.  Feel free to call the clinic you have any questions or concerns. The clinic phone number is [phone].  Please show the [facility] at check-in to the Emergency Department and triage nurse.

## 2015-02-13 ENCOUNTER — Telehealth: Payer: Self-pay | Admitting: *Deleted

## 2015-02-13 NOTE — Telephone Encounter (Signed)
Patient is doing well. He currently has no complaints. He had his medication and taking as prescribed. He states he feels like he might have some bloating or early constipation. Wants to know if he can take prune juice for this. Told him this was fine. No other problems or concerns. Told him to call the office if he has any problems or questions.

## 2015-02-15 ENCOUNTER — Other Ambulatory Visit: Payer: Self-pay | Admitting: Family Medicine

## 2015-02-19 ENCOUNTER — Ambulatory Visit (HOSPITAL_BASED_OUTPATIENT_CLINIC_OR_DEPARTMENT_OTHER): Payer: Medicare Other

## 2015-02-19 ENCOUNTER — Other Ambulatory Visit (HOSPITAL_BASED_OUTPATIENT_CLINIC_OR_DEPARTMENT_OTHER): Payer: Medicare Other

## 2015-02-19 VITALS — BP 122/71 | HR 65 | Temp 98.1°F | Resp 20

## 2015-02-19 DIAGNOSIS — C679 Malignant neoplasm of bladder, unspecified: Secondary | ICD-10-CM | POA: Diagnosis not present

## 2015-02-19 DIAGNOSIS — Z5111 Encounter for antineoplastic chemotherapy: Secondary | ICD-10-CM

## 2015-02-19 DIAGNOSIS — C775 Secondary and unspecified malignant neoplasm of intrapelvic lymph nodes: Secondary | ICD-10-CM

## 2015-02-19 LAB — CBC WITH DIFFERENTIAL (CANCER CENTER ONLY)
BASO#: 0 10*3/uL (ref 0.0–0.2)
BASO%: 0.2 % (ref 0.0–2.0)
EOS ABS: 0 10*3/uL (ref 0.0–0.5)
EOS%: 0.4 % (ref 0.0–7.0)
HEMATOCRIT: 37.7 % — AB (ref 38.7–49.9)
HGB: 12.8 g/dL — ABNORMAL LOW (ref 13.0–17.1)
LYMPH#: 1.3 10*3/uL (ref 0.9–3.3)
LYMPH%: 29.3 % (ref 14.0–48.0)
MCH: 28.8 pg (ref 28.0–33.4)
MCHC: 34 g/dL (ref 32.0–35.9)
MCV: 85 fL (ref 82–98)
MONO#: 0.4 10*3/uL (ref 0.1–0.9)
MONO%: 8.2 % (ref 0.0–13.0)
NEUT#: 2.8 10*3/uL (ref 1.5–6.5)
NEUT%: 61.9 % (ref 40.0–80.0)
Platelets: 160 10*3/uL (ref 145–400)
RBC: 4.44 10*6/uL (ref 4.20–5.70)
RDW: 13.6 % (ref 11.1–15.7)
WBC: 4.5 10*3/uL (ref 4.0–10.0)

## 2015-02-19 LAB — BASIC METABOLIC PANEL - CANCER CENTER ONLY
BUN: 21 mg/dL (ref 7–22)
CALCIUM: 10.1 mg/dL (ref 8.0–10.3)
CHLORIDE: 103 meq/L (ref 98–108)
CO2: 28 mEq/L (ref 18–33)
Creat: 1 mg/dl (ref 0.6–1.2)
Glucose, Bld: 96 mg/dL (ref 73–118)
Potassium: 3.7 mEq/L (ref 3.3–4.7)
Sodium: 136 mEq/L (ref 128–145)

## 2015-02-19 MED ORDER — SODIUM CHLORIDE 0.9 % IV SOLN
Freq: Once | INTRAVENOUS | Status: AC
  Administered 2015-02-19: 12:00:00 via INTRAVENOUS

## 2015-02-19 MED ORDER — PROCHLORPERAZINE MALEATE 10 MG PO TABS
10.0000 mg | ORAL_TABLET | Freq: Once | ORAL | Status: AC
  Administered 2015-02-19: 10 mg via ORAL

## 2015-02-19 MED ORDER — PROCHLORPERAZINE MALEATE 10 MG PO TABS
ORAL_TABLET | ORAL | Status: AC
  Filled 2015-02-19: qty 1

## 2015-02-19 MED ORDER — SODIUM CHLORIDE 0.9 % IV SOLN
1000.0000 mg/m2 | Freq: Once | INTRAVENOUS | Status: AC
  Administered 2015-02-19: 2090 mg via INTRAVENOUS
  Filled 2015-02-19: qty 54.97

## 2015-02-19 NOTE — Patient Instructions (Signed)

## 2015-03-01 ENCOUNTER — Telehealth: Payer: Self-pay | Admitting: Nutrition

## 2015-03-01 NOTE — Telephone Encounter (Signed)
Patient left me a message to contact him regarding diet during cancer treatment.  I called patient but he was not available. Left message for patient to contact me regarding appointment or mailing information.

## 2015-03-02 ENCOUNTER — Telehealth: Payer: Self-pay | Admitting: Nutrition

## 2015-03-02 NOTE — Telephone Encounter (Signed)
Patient wanted information on how to gain weight.  Specifically, he was interested in high calorie, high protein shake recipes. He requested I email them to him. Confirmed email address below. I emailed his request to [email or web address].

## 2015-03-06 ENCOUNTER — Ambulatory Visit (HOSPITAL_BASED_OUTPATIENT_CLINIC_OR_DEPARTMENT_OTHER): Payer: Medicare Other

## 2015-03-06 ENCOUNTER — Ambulatory Visit (HOSPITAL_BASED_OUTPATIENT_CLINIC_OR_DEPARTMENT_OTHER): Payer: Medicare Other | Admitting: Hematology & Oncology

## 2015-03-06 ENCOUNTER — Other Ambulatory Visit (HOSPITAL_BASED_OUTPATIENT_CLINIC_OR_DEPARTMENT_OTHER): Payer: Medicare Other

## 2015-03-06 ENCOUNTER — Encounter: Payer: Self-pay | Admitting: Hematology & Oncology

## 2015-03-06 VITALS — BP 120/63 | HR 61 | Temp 97.3°F | Resp 18 | Ht 72.0 in | Wt 174.0 lb

## 2015-03-06 DIAGNOSIS — C679 Malignant neoplasm of bladder, unspecified: Secondary | ICD-10-CM

## 2015-03-06 DIAGNOSIS — C775 Secondary and unspecified malignant neoplasm of intrapelvic lymph nodes: Secondary | ICD-10-CM

## 2015-03-06 DIAGNOSIS — Z5111 Encounter for antineoplastic chemotherapy: Secondary | ICD-10-CM

## 2015-03-06 LAB — CBC WITH DIFFERENTIAL (CANCER CENTER ONLY)
BASO#: 0 10*3/uL (ref 0.0–0.2)
BASO%: 0.3 % (ref 0.0–2.0)
EOS%: 1.1 % (ref 0.0–7.0)
Eosinophils Absolute: 0 10*3/uL (ref 0.0–0.5)
HEMATOCRIT: 37 % — AB (ref 38.7–49.9)
HGB: 12.4 g/dL — ABNORMAL LOW (ref 13.0–17.1)
LYMPH#: 1.4 10*3/uL (ref 0.9–3.3)
LYMPH%: 39.8 % (ref 14.0–48.0)
MCH: 29.2 pg (ref 28.0–33.4)
MCHC: 33.5 g/dL (ref 32.0–35.9)
MCV: 87 fL (ref 82–98)
MONO#: 0.6 10*3/uL (ref 0.1–0.9)
MONO%: 16.5 % — AB (ref 0.0–13.0)
NEUT#: 1.5 10*3/uL (ref 1.5–6.5)
NEUT%: 42.3 % (ref 40.0–80.0)
Platelets: 312 10*3/uL (ref 145–400)
RBC: 4.25 10*6/uL (ref 4.20–5.70)
RDW: 14.5 % (ref 11.1–15.7)
WBC: 3.5 10*3/uL — AB (ref 4.0–10.0)

## 2015-03-06 LAB — CMP (CANCER CENTER ONLY)
ALK PHOS: 62 U/L (ref 26–84)
ALT(SGPT): 20 U/L (ref 10–47)
AST: 22 U/L (ref 11–38)
Albumin: 3.3 g/dL (ref 3.3–5.5)
BUN, Bld: 19 mg/dL (ref 7–22)
CHLORIDE: 103 meq/L (ref 98–108)
CO2: 23 mEq/L (ref 18–33)
Calcium: 9.6 mg/dL (ref 8.0–10.3)
Creat: 1.1 mg/dl (ref 0.6–1.2)
GLUCOSE: 94 mg/dL (ref 73–118)
Potassium: 3.8 mEq/L (ref 3.3–4.7)
Sodium: 138 mEq/L (ref 128–145)
Total Bilirubin: 0.7 mg/dl (ref 0.20–1.60)
Total Protein: 7.9 g/dL (ref 6.4–8.1)

## 2015-03-06 LAB — LACTATE DEHYDROGENASE: LDH: 136 U/L (ref 94–250)

## 2015-03-06 MED ORDER — SODIUM CHLORIDE 0.9 % IV SOLN
2000.0000 mg | Freq: Once | INTRAVENOUS | Status: AC
  Administered 2015-03-06: 2000 mg via INTRAVENOUS
  Filled 2015-03-06: qty 52.6

## 2015-03-06 MED ORDER — SODIUM CHLORIDE 0.9 % IV SOLN
Freq: Once | INTRAVENOUS | Status: AC
  Administered 2015-03-06: 13:00:00 via INTRAVENOUS
  Filled 2015-03-06: qty 8

## 2015-03-06 MED ORDER — SODIUM CHLORIDE 0.9 % IV SOLN
Freq: Once | INTRAVENOUS | Status: AC
  Administered 2015-03-06: 13:00:00 via INTRAVENOUS

## 2015-03-06 MED ORDER — SODIUM CHLORIDE 0.9 % IV SOLN
504.5000 mg | Freq: Once | INTRAVENOUS | Status: AC
  Administered 2015-03-06: 500 mg via INTRAVENOUS
  Filled 2015-03-06: qty 50

## 2015-03-06 NOTE — Progress Notes (Signed)
Hematology and Oncology Follow Up Visit  YOCHANAN EDDLEMAN [id number] August 14, 1946 69 y.o. 03/06/2015   Principle Diagnosis:   Metastatic bladder cancer  Current Therapy:   Status post cycle 1 of carboplatin/Gemzar Zometa 4 mg IV every 3 weeks      Interim History:  Mr. Deardorff is back for follow-up. He is doing quite well. He has lost some weight. This is a little bit troublesome to me.  He's had no pain. This is encouraging. Her F he's had no cough. He's had no fever. He's had no nausea or vomiting. He's had no change in bowel or bladder habits.  He has not noted any issues with rashes. He's had his leg swelling.  There's been no bleeding.  Overall, his performance status is ECOG 1.    Medications:  Current outpatient prescriptions:  .  hydrochlorothiazide (MICROZIDE) 12.5 MG capsule, Take 1 capsule (12.5 mg total) by mouth every morning., Disp: 90 capsule, Rfl: 3 .  tamsulosin (FLOMAX) 0.4 MG CAPS capsule, TAKE ONE CAPSULE BY MOUTH DAILY, Disp: 30 capsule, Rfl: 2 .  dexamethasone (DECADRON) 4 MG tablet, Take 2 tablets (8 mg total) by mouth 2 (two) times daily with a meal. Start the day after chemotherapy for 3 days. (Patient not taking: Reported on 03/06/2015), Disp: 30 tablet, Rfl: 1 .  ondansetron (ZOFRAN) 8 MG tablet, Take 1 tablet (8 mg total) by mouth 2 (two) times daily. Start the day after chemo for 3 days. Then take as needed for nausea or vomiting. (Patient not taking: Reported on 03/06/2015), Disp: 30 tablet, Rfl: 1 .  prochlorperazine (COMPAZINE) 10 MG tablet, Take 1 tablet (10 mg total) by mouth every 6 (six) hours as needed (Nausea or vomiting). (Patient not taking: Reported on 03/06/2015), Disp: 30 tablet, Rfl: 1  Allergies: No Known Allergies  Past Medical History, Surgical history, Social history, and Family History were reviewed and updated.  Review of Systems: As above  Physical Exam:  height is 6' (1.829 m) and weight is 174 lb (78.926 kg). His oral temperature  is 97.3 F (36.3 C). His blood pressure is 120/63 and his pulse is 61. His respiration is 18.   Wt Readings from Last 3 Encounters:  03/06/15 174 lb (78.926 kg)  02/02/15 184 lb (83.462 kg)  01/19/15 191 lb 12.8 oz (87 kg)     Well-developed and well-nourished African-American gentleman. Head and neck exam shows no ocular or oral lesions. There are no palpable cervical or supraclavicular lymph nodes. Lungs are clear. Cardiac exam regular rate and rhythm with no murmurs, rubs or bruits. Abdomen is soft. He has good bowel sounds. There is no fluid wave. There is no palpable abdominal mass. There is no palpable liver or spleen tip. Back exam shows no tenderness over the spine, ribs or hips. External shows no clubbing, cyanosis or edema. Neurological exam is nonfocal.  Lab Results  Component Value Date   WBC 3.5* 03/06/2015   HGB 12.4* 03/06/2015   HCT 37.0* 03/06/2015   MCV 87 03/06/2015   PLT 312 03/06/2015     Chemistry      Component Value Date/Time   NA 138 03/06/2015 1115   NA 138 01/19/2015 1210   K 3.8 03/06/2015 1115   K 3.9 01/19/2015 1210   CL 103 03/06/2015 1115   CL 102 01/19/2015 1210   CO2 23 03/06/2015 1115   CO2 27 01/19/2015 1210   BUN 19 03/06/2015 1115   BUN 14 01/19/2015 1210   CREATININE 1.1  03/06/2015 1115   CREATININE 1.14 01/19/2015 1210      Component Value Date/Time   CALCIUM 9.6 03/06/2015 1115   CALCIUM 9.8 01/19/2015 1210   ALKPHOS 62 03/06/2015 1115   ALKPHOS 81 09/18/2014 1433   AST 22 03/06/2015 1115   AST 26 09/18/2014 1433   ALT 20 03/06/2015 1115   ALT 21 09/18/2014 1433   BILITOT 0.70 03/06/2015 1115   BILITOT 0.4 09/18/2014 1433         Impression and Plan: Mr. Kulish is 69 year old African-American male with metastatic bladder cancer. This is a poorly differentiated.  He looks okay. Again, the weight loss is a little bit troublesome.  We will go with his second cycle of treatment. After this cycle, I will do a PET scan on  him and we'll see how this looks. Hopefully, we will see that it is better.  I spent about 30 minutes with him today.   [name], MD 6/7/201612:11 PM

## 2015-03-06 NOTE — Patient Instructions (Signed)
Carboplatin injection What is this medicine? CARBOPLATIN (KAR boe pla tin) is a chemotherapy drug. It targets fast dividing cells, like cancer cells, and causes these cells to die. This medicine is used to treat ovarian cancer and many other cancers. This medicine may be used for other purposes; ask your health care provider or pharmacist if you have questions. COMMON BRAND NAME(S): Paraplatin What should I tell my health care provider before I take this medicine? They need to know if you have any of these conditions: -blood disorders -hearing problems -kidney disease -recent or ongoing radiation therapy -an unusual or allergic reaction to carboplatin, cisplatin, other chemotherapy, other medicines, foods, dyes, or preservatives -pregnant or trying to get pregnant -breast-feeding How should I use this medicine? This drug is usually given as an infusion into a vein. It is administered in a hospital or clinic by a specially trained health care professional. Talk to your pediatrician regarding the use of this medicine in children. Special care may be needed. Overdosage: If you think you have taken too much of this medicine contact a poison control center or emergency room at once. NOTE: This medicine is only for you. Do not share this medicine with others. What if I miss a dose? It is important not to miss a dose. Call your doctor or health care professional if you are unable to keep an appointment. What may interact with this medicine? -medicines for seizures -medicines to increase blood counts like filgrastim, pegfilgrastim, sargramostim -some antibiotics like amikacin, gentamicin, neomycin, streptomycin, tobramycin -vaccines Talk to your doctor or health care professional before taking any of these medicines: -acetaminophen -aspirin -ibuprofen -ketoprofen -naproxen This list may not describe all possible interactions. Give your health care provider a list of all the medicines, herbs,  non-prescription drugs, or dietary supplements you use. Also tell them if you smoke, drink alcohol, or use illegal drugs. Some items may interact with your medicine. What should I watch for while using this medicine? Your condition will be monitored carefully while you are receiving this medicine. You will need important blood work done while you are taking this medicine. This drug may make you feel generally unwell. This is not uncommon, as chemotherapy can affect healthy cells as well as cancer cells. Report any side effects. Continue your course of treatment even though you feel ill unless your doctor tells you to stop. In some cases, you may be given additional medicines to help with side effects. Follow all directions for their use. Call your doctor or health care professional for advice if you get a fever, chills or sore throat, or other symptoms of a cold or flu. Do not treat yourself. This drug decreases your body's ability to fight infections. Try to avoid being around people who are sick. This medicine may increase your risk to bruise or bleed. Call your doctor or health care professional if you notice any unusual bleeding. Be careful brushing and flossing your teeth or using a toothpick because you may get an infection or bleed more easily. If you have any dental work done, tell your dentist you are receiving this medicine. Avoid taking products that contain aspirin, acetaminophen, ibuprofen, naproxen, or ketoprofen unless instructed by your doctor. These medicines may hide a fever. Do not become pregnant while taking this medicine. Women should inform their doctor if they wish to become pregnant or think they might be pregnant. There is a potential for serious side effects to an unborn child. Talk to your health care professional or   pharmacist for more information. Do not breast-feed an infant while taking this medicine. What side effects may I notice from receiving this medicine? Side effects  that you should report to your doctor or health care professional as soon as possible: -allergic reactions like skin rash, itching or hives, swelling of the face, lips, or tongue -signs of infection - fever or chills, cough, sore throat, pain or difficulty passing urine -signs of decreased platelets or bleeding - bruising, pinpoint red spots on the skin, black, tarry stools, nosebleeds -signs of decreased red blood cells - unusually weak or tired, fainting spells, lightheadedness -breathing problems -changes in hearing -changes in vision -chest pain -high blood pressure -low blood counts - This drug may decrease the number of white blood cells, red blood cells and platelets. You may be at increased risk for infections and bleeding. -nausea and vomiting -pain, swelling, redness or irritation at the injection site -pain, tingling, numbness in the hands or feet -problems with balance, talking, walking -trouble passing urine or change in the amount of urine Side effects that usually do not require medical attention (report to your doctor or health care professional if they continue or are bothersome): -hair loss -loss of appetite -metallic taste in the mouth or changes in taste This list may not describe all possible side effects. Call your doctor for medical advice about side effects. You may report side effects to FDA at 1-800-FDA-1088. Where should I keep my medicine? This drug is given in a hospital or clinic and will not be stored at home. NOTE: This sheet is a summary. It may not cover all possible information. If you have questions about this medicine, talk to your doctor, pharmacist, or health care provider.  2015, Elsevier/Gold Standard. (2007-12-21 14:38:05) Gemcitabine injection What is this medicine? GEMCITABINE (jem SIT a been) is a chemotherapy drug. This medicine is used to treat many types of cancer like breast cancer, lung cancer, pancreatic cancer, and ovarian cancer. This  medicine may be used for other purposes; ask your health care provider or pharmacist if you have questions. COMMON BRAND NAME(S): Gemzar What should I tell my health care provider before I take this medicine? They need to know if you have any of these conditions: -blood disorders -infection -kidney disease -liver disease -recent or ongoing radiation therapy -an unusual or allergic reaction to gemcitabine, other chemotherapy, other medicines, foods, dyes, or preservatives -pregnant or trying to get pregnant -breast-feeding How should I use this medicine? This drug is given as an infusion into a vein. It is administered in a hospital or clinic by a specially trained health care professional. Talk to your pediatrician regarding the use of this medicine in children. Special care may be needed. Overdosage: If you think you have taken too much of this medicine contact a poison control center or emergency room at once. NOTE: This medicine is only for you. Do not share this medicine with others. What if I miss a dose? It is important not to miss your dose. Call your doctor or health care professional if you are unable to keep an appointment. What may interact with this medicine? -medicines to increase blood counts like filgrastim, pegfilgrastim, sargramostim -some other chemotherapy drugs like cisplatin -vaccines Talk to your doctor or health care professional before taking any of these medicines: -acetaminophen -aspirin -ibuprofen -ketoprofen -naproxen This list may not describe all possible interactions. Give your health care provider a list of all the medicines, herbs, non-prescription drugs, or dietary supplements you use. Also   tell them if you smoke, drink alcohol, or use illegal drugs. Some items may interact with your medicine. What should I watch for while using this medicine? Visit your doctor for checks on your progress. This drug may make you feel generally unwell. This is not  uncommon, as chemotherapy can affect healthy cells as well as cancer cells. Report any side effects. Continue your course of treatment even though you feel ill unless your doctor tells you to stop. In some cases, you may be given additional medicines to help with side effects. Follow all directions for their use. Call your doctor or health care professional for advice if you get a fever, chills or sore throat, or other symptoms of a cold or flu. Do not treat yourself. This drug decreases your body's ability to fight infections. Try to avoid being around people who are sick. This medicine may increase your risk to bruise or bleed. Call your doctor or health care professional if you notice any unusual bleeding. Be careful brushing and flossing your teeth or using a toothpick because you may get an infection or bleed more easily. If you have any dental work done, tell your dentist you are receiving this medicine. Avoid taking products that contain aspirin, acetaminophen, ibuprofen, naproxen, or ketoprofen unless instructed by your doctor. These medicines may hide a fever. Women should inform their doctor if they wish to become pregnant or think they might be pregnant. There is a potential for serious side effects to an unborn child. Talk to your health care professional or pharmacist for more information. Do not breast-feed an infant while taking this medicine. What side effects may I notice from receiving this medicine? Side effects that you should report to your doctor or health care professional as soon as possible: -allergic reactions like skin rash, itching or hives, swelling of the face, lips, or tongue -low blood counts - this medicine may decrease the number of white blood cells, red blood cells and platelets. You may be at increased risk for infections and bleeding. -signs of infection - fever or chills, cough, sore throat, pain or difficulty passing urine -signs of decreased platelets or bleeding  - bruising, pinpoint red spots on the skin, black, tarry stools, blood in the urine -signs of decreased red blood cells - unusually weak or tired, fainting spells, lightheadedness -breathing problems -chest pain -mouth sores -nausea and vomiting -pain, swelling, redness at site where injected -pain, tingling, numbness in the hands or feet -stomach pain -swelling of ankles, feet, hands -unusual bleeding Side effects that usually do not require medical attention (report to your doctor or health care professional if they continue or are bothersome): -constipation -diarrhea -hair loss -loss of appetite -stomach upset This list may not describe all possible side effects. Call your doctor for medical advice about side effects. You may report side effects to FDA at 1-800-FDA-1088. Where should I keep my medicine? This drug is given in a hospital or clinic and will not be stored at home. NOTE: This sheet is a summary. It may not cover all possible information. If you have questions about this medicine, talk to your doctor, pharmacist, or health care provider.  2015, Elsevier/Gold Standard. (2008-01-25 18:45:54)  

## 2015-03-12 ENCOUNTER — Other Ambulatory Visit (HOSPITAL_BASED_OUTPATIENT_CLINIC_OR_DEPARTMENT_OTHER): Payer: Medicare Other

## 2015-03-12 ENCOUNTER — Ambulatory Visit: Payer: Medicare Other

## 2015-03-12 DIAGNOSIS — C775 Secondary and unspecified malignant neoplasm of intrapelvic lymph nodes: Principal | ICD-10-CM

## 2015-03-12 DIAGNOSIS — C679 Malignant neoplasm of bladder, unspecified: Secondary | ICD-10-CM

## 2015-03-12 LAB — CBC WITH DIFFERENTIAL (CANCER CENTER ONLY)
BASO#: 0 10*3/uL (ref 0.0–0.2)
BASO%: 1 % (ref 0.0–2.0)
EOS%: 1 % (ref 0.0–7.0)
Eosinophils Absolute: 0 10*3/uL (ref 0.0–0.5)
HEMATOCRIT: 36.2 % — AB (ref 38.7–49.9)
HGB: 12.2 g/dL — ABNORMAL LOW (ref 13.0–17.1)
LYMPH#: 1.3 10*3/uL (ref 0.9–3.3)
LYMPH%: 61.9 % — AB (ref 14.0–48.0)
MCH: 29.2 pg (ref 28.0–33.4)
MCHC: 33.7 g/dL (ref 32.0–35.9)
MCV: 87 fL (ref 82–98)
MONO#: 0 10*3/uL — ABNORMAL LOW (ref 0.1–0.9)
MONO%: 1.4 % (ref 0.0–13.0)
NEUT#: 0.7 10*3/uL — ABNORMAL LOW (ref 1.5–6.5)
NEUT%: 34.7 % — ABNORMAL LOW (ref 40.0–80.0)
Platelets: 323 10*3/uL (ref 145–400)
RBC: 4.18 10*6/uL — ABNORMAL LOW (ref 4.20–5.70)
RDW: 14.1 % (ref 11.1–15.7)
WBC: 2.1 10*3/uL — ABNORMAL LOW (ref 4.0–10.0)

## 2015-03-12 LAB — CMP (CANCER CENTER ONLY)
ALBUMIN: 3.7 g/dL (ref 3.3–5.5)
ALT(SGPT): 38 U/L (ref 10–47)
AST: 39 U/L — ABNORMAL HIGH (ref 11–38)
Alkaline Phosphatase: 67 U/L (ref 26–84)
BUN, Bld: 14 mg/dL (ref 7–22)
CO2: 28 meq/L (ref 18–33)
Calcium: 9.9 mg/dL (ref 8.0–10.3)
Chloride: 99 mEq/L (ref 98–108)
Creat: 1.1 mg/dl (ref 0.6–1.2)
Glucose, Bld: 91 mg/dL (ref 73–118)
Potassium: 3.7 mEq/L (ref 3.3–4.7)
Sodium: 135 mEq/L (ref 128–145)
Total Bilirubin: 1 mg/dl (ref 0.20–1.60)
Total Protein: 7.9 g/dL (ref 6.4–8.1)

## 2015-03-12 NOTE — Progress Notes (Signed)
Per Dr. [name]; hold Gemzar today due to [place] 0.7. Instructed patient to call with any concerns and to return for all future appointments. Schedule given to patient at time of discharge. Patient verbalized understanding.

## 2015-03-12 NOTE — Progress Notes (Signed)
No treatment today per dr. ennever 

## 2015-03-13 LAB — PREALBUMIN: Prealbumin: 25 mg/dL (ref 21–43)

## 2015-03-22 ENCOUNTER — Telehealth: Payer: Self-pay | Admitting: *Deleted

## 2015-03-22 ENCOUNTER — Ambulatory Visit (HOSPITAL_BASED_OUTPATIENT_CLINIC_OR_DEPARTMENT_OTHER)
Admission: RE | Admit: 2015-03-22 | Discharge: 2015-03-22 | Disposition: A | Discharge status: Home / Self Care | Payer: Medicare Other | Source: Ambulatory Visit | Attending: Family | Admitting: Family

## 2015-03-22 ENCOUNTER — Ambulatory Visit (HOSPITAL_COMMUNITY)
Admission: RE | Admit: 2015-03-22 | Discharge: 2015-03-22 | Disposition: A | Discharge status: Home / Self Care | Payer: Medicare Other | Source: Ambulatory Visit | Attending: Hematology & Oncology | Admitting: Hematology & Oncology

## 2015-03-22 DIAGNOSIS — M7989 Other specified soft tissue disorders: Secondary | ICD-10-CM | POA: Insufficient documentation

## 2015-03-22 DIAGNOSIS — E049 Nontoxic goiter, unspecified: Secondary | ICD-10-CM | POA: Diagnosis not present

## 2015-03-22 DIAGNOSIS — C679 Malignant neoplasm of bladder, unspecified: Secondary | ICD-10-CM

## 2015-03-22 DIAGNOSIS — C7951 Secondary malignant neoplasm of bone: Secondary | ICD-10-CM | POA: Diagnosis not present

## 2015-03-22 DIAGNOSIS — C786 Secondary malignant neoplasm of retroperitoneum and peritoneum: Secondary | ICD-10-CM | POA: Diagnosis not present

## 2015-03-22 DIAGNOSIS — C775 Secondary and unspecified malignant neoplasm of intrapelvic lymph nodes: Secondary | ICD-10-CM | POA: Diagnosis not present

## 2015-03-22 LAB — GLUCOSE, CAPILLARY: GLUCOSE-CAPILLARY: 85 mg/dL (ref 65–99)

## 2015-03-22 MED ORDER — FLUDEOXYGLUCOSE F - 18 (FDG) INJECTION
8.9000 | Freq: Once | INTRAVENOUS | Status: AC | PRN
  Administered 2015-03-22: 8.9 via INTRAVENOUS

## 2015-03-22 NOTE — Telephone Encounter (Signed)
Patient c/o swelling of the Right leg. He denies warmth or pain in that leg. Discussed with Scherrie Bateman NP, and due to patient's pelvic adenopathy, an order for a doppler will be ordered and have patient done today. Patient is in agreement with plan.

## 2015-03-23 ENCOUNTER — Telehealth: Payer: Self-pay | Admitting: *Deleted

## 2015-03-23 NOTE — Telephone Encounter (Signed)
Notified patient of negative doppler per Laverna Peace NP. Patient instructed to rest and keep leg elevated during times through out the day. He has an appointment with Dr [name] on Tuesday where the office can follow up on the swelling. He understands and is in agreement.

## 2015-03-26 ENCOUNTER — Other Ambulatory Visit: Payer: Self-pay

## 2015-03-26 ENCOUNTER — Telehealth: Payer: Self-pay | Admitting: *Deleted

## 2015-03-26 NOTE — Telephone Encounter (Addendum)
Patient is aware of results.  ----- Message from [name], MD sent at 03/25/2015  9:22 PM EDT ----- Call - cancer is improving!!!!  Demetrios Isaacs!!!  Laurey Arrow

## 2015-03-27 ENCOUNTER — Ambulatory Visit (HOSPITAL_BASED_OUTPATIENT_CLINIC_OR_DEPARTMENT_OTHER): Payer: Medicare Other | Admitting: Hematology & Oncology

## 2015-03-27 ENCOUNTER — Encounter: Payer: Self-pay | Admitting: Hematology & Oncology

## 2015-03-27 ENCOUNTER — Ambulatory Visit (HOSPITAL_BASED_OUTPATIENT_CLINIC_OR_DEPARTMENT_OTHER): Payer: Medicare Other

## 2015-03-27 VITALS — BP 99/63 | HR 67 | Temp 97.7°F | Resp 20 | Ht 72.0 in | Wt 178.0 lb

## 2015-03-27 DIAGNOSIS — C7951 Secondary malignant neoplasm of bone: Secondary | ICD-10-CM | POA: Diagnosis not present

## 2015-03-27 DIAGNOSIS — C775 Secondary and unspecified malignant neoplasm of intrapelvic lymph nodes: Secondary | ICD-10-CM

## 2015-03-27 DIAGNOSIS — Z5111 Encounter for antineoplastic chemotherapy: Secondary | ICD-10-CM

## 2015-03-27 DIAGNOSIS — C679 Malignant neoplasm of bladder, unspecified: Secondary | ICD-10-CM

## 2015-03-27 LAB — CBC WITH DIFFERENTIAL (CANCER CENTER ONLY)
BASO#: 0 10*3/uL (ref 0.0–0.2)
BASO%: 0.3 % (ref 0.0–2.0)
EOS ABS: 0.1 10*3/uL (ref 0.0–0.5)
EOS%: 1.7 % (ref 0.0–7.0)
HCT: 32.5 % — ABNORMAL LOW (ref 38.7–49.9)
HEMOGLOBIN: 11 g/dL — AB (ref 13.0–17.1)
LYMPH#: 1.1 10*3/uL (ref 0.9–3.3)
LYMPH%: 32.1 % (ref 14.0–48.0)
MCH: 30.3 pg (ref 28.0–33.4)
MCHC: 33.8 g/dL (ref 32.0–35.9)
MCV: 90 fL (ref 82–98)
MONO#: 0.4 10*3/uL (ref 0.1–0.9)
MONO%: 11.5 % (ref 0.0–13.0)
NEUT#: 1.9 10*3/uL (ref 1.5–6.5)
NEUT%: 54.4 % (ref 40.0–80.0)
PLATELETS: 115 10*3/uL — AB (ref 145–400)
RBC: 3.63 10*6/uL — AB (ref 4.20–5.70)
RDW: 17.6 % — ABNORMAL HIGH (ref 11.1–15.7)
WBC: 3.6 10*3/uL — AB (ref 4.0–10.0)

## 2015-03-27 LAB — BASIC METABOLIC PANEL - CANCER CENTER ONLY
BUN: 20 mg/dL (ref 7–22)
CALCIUM: 9.2 mg/dL (ref 8.0–10.3)
CO2: 27 mEq/L (ref 18–33)
Chloride: 107 mEq/L (ref 98–108)
Creat: 1.1 mg/dl (ref 0.6–1.2)
GLUCOSE: 89 mg/dL (ref 73–118)
Potassium: 4 mEq/L (ref 3.3–4.7)
Sodium: 140 mEq/L (ref 128–145)

## 2015-03-27 MED ORDER — SODIUM CHLORIDE 0.9 % IV SOLN
Freq: Once | INTRAVENOUS | Status: AC
  Administered 2015-03-27: 10:00:00 via INTRAVENOUS
  Filled 2015-03-27: qty 8

## 2015-03-27 MED ORDER — GEMCITABINE HCL CHEMO INJECTION 1 GM/26.3ML
2000.0000 mg | Freq: Once | INTRAVENOUS | Status: AC
  Administered 2015-03-27: 2000 mg via INTRAVENOUS
  Filled 2015-03-27: qty 52.6

## 2015-03-27 MED ORDER — CARBOPLATIN CHEMO INJECTION 600 MG/60ML
504.5000 mg | Freq: Once | INTRAVENOUS | Status: AC
  Administered 2015-03-27: 500 mg via INTRAVENOUS
  Filled 2015-03-27: qty 50

## 2015-03-27 MED ORDER — ZOLEDRONIC ACID 4 MG/100ML IV SOLN
4.0000 mg | Freq: Once | INTRAVENOUS | Status: AC
  Administered 2015-03-27: 4 mg via INTRAVENOUS
  Filled 2015-03-27: qty 100

## 2015-03-27 MED ORDER — SODIUM CHLORIDE 0.9 % IV SOLN
Freq: Once | INTRAVENOUS | Status: AC
  Administered 2015-03-27: 09:00:00 via INTRAVENOUS

## 2015-03-27 NOTE — Patient Instructions (Signed)
[facility] Discharge Instructions for Patients Receiving Chemotherapy  Today you received the following chemotherapy agents Gemzar, Carboplatin and Zometa.  To help prevent nausea and vomiting after your treatment, we encourage you to take your nausea medication.   If you develop nausea and vomiting that is not controlled by your nausea medication, call the clinic.   BELOW ARE SYMPTOMS THAT SHOULD BE REPORTED IMMEDIATELY:  *FEVER GREATER THAN 100.5 F  *CHILLS WITH OR WITHOUT FEVER  NAUSEA AND VOMITING THAT IS NOT CONTROLLED WITH YOUR NAUSEA MEDICATION  *UNUSUAL SHORTNESS OF BREATH  *UNUSUAL BRUISING OR BLEEDING  TENDERNESS IN MOUTH AND THROAT WITH OR WITHOUT PRESENCE OF ULCERS  *URINARY PROBLEMS  *BOWEL PROBLEMS  UNUSUAL RASH Items with * indicate a potential emergency and should be followed up as soon as possible.  Feel free to call the clinic you have any questions or concerns. The clinic phone number is [phone].  Please show the [facility] at check-in to the Emergency Department and triage nurse.

## 2015-03-27 NOTE — Progress Notes (Signed)
Hematology and Oncology Follow Up Visit  Dustin Jordan [id number] Mar 11, 1946 69 y.o. 03/27/2015   Principle Diagnosis:   Metastatic bladder cancer  Current Therapy:   Status post cycle 1 of carboplatin/Gemzar Zometa 4 mg IV every 3 weeks      Interim History:  Dustin Jordan is back for follow-up. He is doing quite well. He is happy that his weight is going up. He ziti better. He is not having issues with pain.  He is having some swelling in the right leg. Had a Doppler done back on June 23. Doppler was negative.  We went ahead and did a PET scan on him last week. PET scan showed improved metastatic disease. There is nothing new that was seen.  He's had no problems with bowels or bladder.  There's been no bleeding.  He's had no cough. There's been no shortness of breath.  Overall, his performance status is ECOG 1.    Medications:  Current outpatient prescriptions:  .  dexamethasone (DECADRON) 4 MG tablet, Take 2 tablets (8 mg total) by mouth 2 (two) times daily with a meal. Start the day after chemotherapy for 3 days., Disp: 30 tablet, Rfl: 1 .  hydrochlorothiazide (MICROZIDE) 12.5 MG capsule, Take 1 capsule (12.5 mg total) by mouth every morning., Disp: 90 capsule, Rfl: 3 .  ondansetron (ZOFRAN) 8 MG tablet, Take 1 tablet (8 mg total) by mouth 2 (two) times daily. Start the day after chemo for 3 days. Then take as needed for nausea or vomiting., Disp: 30 tablet, Rfl: 1 .  prochlorperazine (COMPAZINE) 10 MG tablet, Take 1 tablet (10 mg total) by mouth every 6 (six) hours as needed (Nausea or vomiting)., Disp: 30 tablet, Rfl: 1 .  tamsulosin (FLOMAX) 0.4 MG CAPS capsule, TAKE ONE CAPSULE BY MOUTH DAILY, Disp: 30 capsule, Rfl: 2  Allergies: No Known Allergies  Past Medical History, Surgical history, Social history, and Family History were reviewed and updated.  Review of Systems: As above  Physical Exam:  height is 6' (1.829 m) and weight is 178 lb (80.74 kg). His oral  temperature is 97.7 F (36.5 C). His blood pressure is 99/63 and his pulse is 67. His respiration is 20.   Wt Readings from Last 3 Encounters:  03/27/15 178 lb (80.74 kg)  03/06/15 174 lb (78.926 kg)  02/02/15 184 lb (83.462 kg)     Well-developed and well-nourished African-American gentleman. Head and neck exam shows no ocular or oral lesions. There are no palpable cervical or supraclavicular lymph nodes. Lungs are clear. Cardiac exam regular rate and rhythm with no murmurs, rubs or bruits. Abdomen is soft. He has good bowel sounds. There is no fluid wave. There is no palpable abdominal mass. There is no palpable liver or spleen tip. Back exam shows no tenderness over the spine, ribs or hips. External shows no clubbing, cyanosis or edema. Neurological exam is nonfocal.  Lab Results  Component Value Date   WBC 3.6* 03/27/2015   HGB 11.0* 03/27/2015   HCT 32.5* 03/27/2015   MCV 90 03/27/2015   PLT 115* 03/27/2015     Chemistry      Component Value Date/Time   NA 135 03/12/2015 1023   NA 138 01/19/2015 1210   K 3.7 03/12/2015 1023   K 3.9 01/19/2015 1210   CL 99 03/12/2015 1023   CL 102 01/19/2015 1210   CO2 28 03/12/2015 1023   CO2 27 01/19/2015 1210   BUN 14 03/12/2015 1023   BUN 14  01/19/2015 1210   CREATININE 1.1 03/12/2015 1023   CREATININE 1.14 01/19/2015 1210      Component Value Date/Time   CALCIUM 9.9 03/12/2015 1023   CALCIUM 9.8 01/19/2015 1210   ALKPHOS 67 03/12/2015 1023   ALKPHOS 81 09/18/2014 1433   AST 39* 03/12/2015 1023   AST 26 09/18/2014 1433   ALT 38 03/12/2015 1023   ALT 21 09/18/2014 1433   BILITOT 1.00 03/12/2015 1023   BILITOT 0.4 09/18/2014 1433         Impression and Plan: Dustin Jordan is 69 year old African-American male with metastatic bladder cancer. This is a poorly differentiated.  Again, everything looks fantastic. His weight is going up. He is feeling well. He is not having issues with pain.   The chemotherapy is definitely  doing the job right now.  As always, his faith is strong.  We will go ahead and treat him today.  I spent about 30 minutes with him today.   [name], MD 6/28/20169:07 AM

## 2015-03-31 ENCOUNTER — Other Ambulatory Visit: Payer: Self-pay | Admitting: Family Medicine

## 2015-04-03 ENCOUNTER — Other Ambulatory Visit (HOSPITAL_BASED_OUTPATIENT_CLINIC_OR_DEPARTMENT_OTHER): Payer: Medicare Other

## 2015-04-03 ENCOUNTER — Ambulatory Visit (HOSPITAL_BASED_OUTPATIENT_CLINIC_OR_DEPARTMENT_OTHER): Payer: Medicare Other

## 2015-04-03 VITALS — BP 148/73 | HR 56 | Temp 97.9°F | Resp 18

## 2015-04-03 DIAGNOSIS — Z5111 Encounter for antineoplastic chemotherapy: Secondary | ICD-10-CM

## 2015-04-03 DIAGNOSIS — C679 Malignant neoplasm of bladder, unspecified: Secondary | ICD-10-CM | POA: Diagnosis not present

## 2015-04-03 DIAGNOSIS — C775 Secondary and unspecified malignant neoplasm of intrapelvic lymph nodes: Secondary | ICD-10-CM

## 2015-04-03 LAB — CBC WITH DIFFERENTIAL (CANCER CENTER ONLY)
BASO#: 0 10*3/uL (ref 0.0–0.2)
BASO%: 0.4 % (ref 0.0–2.0)
EOS%: 0.8 % (ref 0.0–7.0)
Eosinophils Absolute: 0 10*3/uL (ref 0.0–0.5)
HEMATOCRIT: 31.7 % — AB (ref 38.7–49.9)
HEMOGLOBIN: 10.9 g/dL — AB (ref 13.0–17.1)
LYMPH#: 1 10*3/uL (ref 0.9–3.3)
LYMPH%: 39.4 % (ref 14.0–48.0)
MCH: 30.5 pg (ref 28.0–33.4)
MCHC: 34.4 g/dL (ref 32.0–35.9)
MCV: 89 fL (ref 82–98)
MONO#: 0.1 10*3/uL (ref 0.1–0.9)
MONO%: 4.3 % (ref 0.0–13.0)
NEUT#: 1.4 10*3/uL — ABNORMAL LOW (ref 1.5–6.5)
NEUT%: 55.1 % (ref 40.0–80.0)
PLATELETS: 81 10*3/uL — AB (ref 145–400)
RBC: 3.57 10*6/uL — ABNORMAL LOW (ref 4.20–5.70)
RDW: 17.2 % — AB (ref 11.1–15.7)
WBC: 2.5 10*3/uL — AB (ref 4.0–10.0)

## 2015-04-03 LAB — BASIC METABOLIC PANEL - CANCER CENTER ONLY
BUN, Bld: 13 mg/dL (ref 7–22)
CALCIUM: 9 mg/dL (ref 8.0–10.3)
CO2: 25 mEq/L (ref 18–33)
CREATININE: 1 mg/dL (ref 0.6–1.2)
Chloride: 106 mEq/L (ref 98–108)
Glucose, Bld: 93 mg/dL (ref 73–118)
Potassium: 3.6 mEq/L (ref 3.3–4.7)
Sodium: 137 mEq/L (ref 128–145)

## 2015-04-03 MED ORDER — PROCHLORPERAZINE MALEATE 10 MG PO TABS
ORAL_TABLET | ORAL | Status: AC
Start: 2015-04-03 — End: 2015-04-03
  Filled 2015-04-03: qty 1

## 2015-04-03 MED ORDER — SODIUM CHLORIDE 0.9 % IV SOLN
Freq: Once | INTRAVENOUS | Status: AC
  Administered 2015-04-03: 12:00:00 via INTRAVENOUS

## 2015-04-03 MED ORDER — PROCHLORPERAZINE MALEATE 10 MG PO TABS
10.0000 mg | ORAL_TABLET | Freq: Once | ORAL | Status: AC
  Administered 2015-04-03: 10 mg via ORAL

## 2015-04-03 MED ORDER — GEMCITABINE HCL CHEMO INJECTION 1 GM/26.3ML
2000.0000 mg | Freq: Once | INTRAVENOUS | Status: AC
  Administered 2015-04-03: 2000 mg via INTRAVENOUS
  Filled 2015-04-03: qty 52.6

## 2015-04-03 NOTE — Progress Notes (Signed)
Ok to treat per Dr [name] with ANC 1.4 and platelets 81.

## 2015-04-03 NOTE — Patient Instructions (Signed)

## 2015-04-17 ENCOUNTER — Encounter: Payer: Self-pay | Admitting: Hematology & Oncology

## 2015-04-17 ENCOUNTER — Ambulatory Visit (HOSPITAL_BASED_OUTPATIENT_CLINIC_OR_DEPARTMENT_OTHER): Payer: Medicare Other

## 2015-04-17 ENCOUNTER — Ambulatory Visit (HOSPITAL_BASED_OUTPATIENT_CLINIC_OR_DEPARTMENT_OTHER): Payer: Medicare Other | Admitting: Hematology & Oncology

## 2015-04-17 ENCOUNTER — Other Ambulatory Visit (HOSPITAL_BASED_OUTPATIENT_CLINIC_OR_DEPARTMENT_OTHER): Payer: Medicare Other

## 2015-04-17 VITALS — BP 105/57 | HR 77 | Temp 98.3°F | Resp 16

## 2015-04-17 DIAGNOSIS — C775 Secondary and unspecified malignant neoplasm of intrapelvic lymph nodes: Secondary | ICD-10-CM

## 2015-04-17 DIAGNOSIS — C679 Malignant neoplasm of bladder, unspecified: Secondary | ICD-10-CM | POA: Diagnosis not present

## 2015-04-17 DIAGNOSIS — Z5111 Encounter for antineoplastic chemotherapy: Secondary | ICD-10-CM | POA: Diagnosis not present

## 2015-04-17 DIAGNOSIS — C671 Malignant neoplasm of dome of bladder: Secondary | ICD-10-CM

## 2015-04-17 DIAGNOSIS — C7951 Secondary malignant neoplasm of bone: Secondary | ICD-10-CM

## 2015-04-17 LAB — CMP (CANCER CENTER ONLY)
ALK PHOS: 70 U/L (ref 26–84)
ALT(SGPT): 16 U/L (ref 10–47)
AST: 26 U/L (ref 11–38)
Albumin: 3.5 g/dL (ref 3.3–5.5)
BUN, Bld: 18 mg/dL (ref 7–22)
CO2: 25 mEq/L (ref 18–33)
Calcium: 9.3 mg/dL (ref 8.0–10.3)
Chloride: 109 mEq/L — ABNORMAL HIGH (ref 98–108)
Creat: 1.1 mg/dl (ref 0.6–1.2)
Glucose, Bld: 82 mg/dL (ref 73–118)
Potassium: 4.3 mEq/L (ref 3.3–4.7)
SODIUM: 141 meq/L (ref 128–145)
Total Bilirubin: 0.8 mg/dl (ref 0.20–1.60)
Total Protein: 8.1 g/dL (ref 6.4–8.1)

## 2015-04-17 LAB — CBC WITH DIFFERENTIAL (CANCER CENTER ONLY)
BASO#: 0 10*3/uL (ref 0.0–0.2)
BASO%: 0.4 % (ref 0.0–2.0)
EOS ABS: 0 10*3/uL (ref 0.0–0.5)
EOS%: 0.4 % (ref 0.0–7.0)
HCT: 30 % — ABNORMAL LOW (ref 38.7–49.9)
HGB: 10.2 g/dL — ABNORMAL LOW (ref 13.0–17.1)
LYMPH#: 1.4 10*3/uL (ref 0.9–3.3)
LYMPH%: 50 % — ABNORMAL HIGH (ref 14.0–48.0)
MCH: 31.1 pg (ref 28.0–33.4)
MCHC: 34 g/dL (ref 32.0–35.9)
MCV: 92 fL (ref 82–98)
MONO#: 0.5 10*3/uL (ref 0.1–0.9)
MONO%: 16.2 % — ABNORMAL HIGH (ref 0.0–13.0)
NEUT%: 33 % — ABNORMAL LOW (ref 40.0–80.0)
NEUTROS ABS: 0.9 10*3/uL — AB (ref 1.5–6.5)
PLATELETS: 254 10*3/uL (ref 145–400)
RBC: 3.28 10*6/uL — ABNORMAL LOW (ref 4.20–5.70)
RDW: 19.2 % — ABNORMAL HIGH (ref 11.1–15.7)
WBC: 2.8 10*3/uL — ABNORMAL LOW (ref 4.0–10.0)

## 2015-04-17 LAB — LACTATE DEHYDROGENASE: LDH: 149 U/L (ref 94–250)

## 2015-04-17 MED ORDER — SODIUM CHLORIDE 0.9 % IV SOLN
Freq: Once | INTRAVENOUS | Status: AC
  Administered 2015-04-17: 12:00:00 via INTRAVENOUS

## 2015-04-17 MED ORDER — SODIUM CHLORIDE 0.9 % IV SOLN
Freq: Once | INTRAVENOUS | Status: AC
  Administered 2015-04-17: 13:00:00 via INTRAVENOUS
  Filled 2015-04-17: qty 8

## 2015-04-17 MED ORDER — SODIUM CHLORIDE 0.9 % IV SOLN
500.0000 mg | Freq: Once | INTRAVENOUS | Status: AC
  Administered 2015-04-17: 500 mg via INTRAVENOUS
  Filled 2015-04-17: qty 50

## 2015-04-17 MED ORDER — SODIUM CHLORIDE 0.9 % IV SOLN
2000.0000 mg | Freq: Once | INTRAVENOUS | Status: AC
  Administered 2015-04-17: 2000 mg via INTRAVENOUS
  Filled 2015-04-17: qty 52.6

## 2015-04-17 MED ORDER — ZOLEDRONIC ACID 4 MG/100ML IV SOLN
4.0000 mg | Freq: Once | INTRAVENOUS | Status: AC
  Administered 2015-04-17: 4 mg via INTRAVENOUS
  Filled 2015-04-17: qty 100

## 2015-04-17 NOTE — Progress Notes (Signed)
Per Dr [name]; ok to treat with anc 0.9.

## 2015-04-17 NOTE — Progress Notes (Signed)
Hematology and Oncology Follow Up Visit  ZYMARION FAVORITE [id number] 1946/02/20 69 y.o. 04/17/2015   Principle Diagnosis:   Metastatic bladder cancer  Current Therapy:   Status post cycle 3 of carboplatin/Gemzar Zometa 4 mg IV every 3 weeks      Interim History:  Mr. Klingbeil is back for follow-up. He really looks good. He feels good. He's had no problems with nausea or vomiting. His appetite has been okay. He's had no issues with cough. He's had no change in bowel or bladder habits.  He still has a little bit of swelling in the right leg. This is been chronic.  Pain has not been an issue. Her affect is been no bleeding.  Overall, his performance status is ECOG 1.    Medications:  Current outpatient prescriptions:  .  dexamethasone (DECADRON) 4 MG tablet, Take 2 tablets (8 mg total) by mouth 2 (two) times daily with a meal. Start the day after chemotherapy for 3 days., Disp: 30 tablet, Rfl: 1 .  hydrochlorothiazide (MICROZIDE) 12.5 MG capsule, Take 1 capsule (12.5 mg total) by mouth every morning., Disp: 90 capsule, Rfl: 3 .  ondansetron (ZOFRAN) 8 MG tablet, Take 1 tablet (8 mg total) by mouth 2 (two) times daily. Start the day after chemo for 3 days. Then take as needed for nausea or vomiting., Disp: 30 tablet, Rfl: 1 .  prochlorperazine (COMPAZINE) 10 MG tablet, Take 1 tablet (10 mg total) by mouth every 6 (six) hours as needed (Nausea or vomiting)., Disp: 30 tablet, Rfl: 1 .  tamsulosin (FLOMAX) 0.4 MG CAPS capsule, TAKE 1 CAPSULE BY MOUTH DAILY, Disp: 90 capsule, Rfl: 0  Allergies: No Known Allergies  Past Medical History, Surgical history, Social history, and Family History were reviewed and updated.  Review of Systems: As above  Physical Exam:  oral temperature is 98.3 F (36.8 C). His blood pressure is 105/57 and his pulse is 77. His respiration is 16.   Wt Readings from Last 3 Encounters:  03/27/15 178 lb (80.74 kg)  03/06/15 174 lb (78.926 kg)  02/02/15 184 lb  (83.462 kg)     Well-developed and well-nourished African-American gentleman. Head and neck exam shows no ocular or oral lesions. There are no palpable cervical or supraclavicular lymph nodes. Lungs are clear. Cardiac exam regular rate and rhythm with no murmurs, rubs or bruits. Abdomen is soft. He has good bowel sounds. There is no fluid wave. There is no palpable abdominal mass. There is no palpable liver or spleen tip. Back exam shows no tenderness over the spine, ribs or hips. External shows no clubbing, cyanosis or edema. Neurological exam is nonfocal.  Lab Results  Component Value Date   WBC 2.8* 04/17/2015   HGB 10.2* 04/17/2015   HCT 30.0* 04/17/2015   MCV 92 04/17/2015   PLT 254 04/17/2015     Chemistry      Component Value Date/Time   NA 141 04/17/2015 1100   NA 138 01/19/2015 1210   K 4.3 04/17/2015 1100   K 3.9 01/19/2015 1210   CL 109* 04/17/2015 1100   CL 102 01/19/2015 1210   CO2 25 04/17/2015 1100   CO2 27 01/19/2015 1210   BUN 18 04/17/2015 1100   BUN 14 01/19/2015 1210   CREATININE 1.1 04/17/2015 1100   CREATININE 1.14 01/19/2015 1210      Component Value Date/Time   CALCIUM 9.3 04/17/2015 1100   CALCIUM 9.8 01/19/2015 1210   ALKPHOS 70 04/17/2015 1100   ALKPHOS 81  09/18/2014 1433   AST 26 04/17/2015 1100   AST 26 09/18/2014 1433   ALT 16 04/17/2015 1100   ALT 21 09/18/2014 1433   BILITOT 0.80 04/17/2015 1100   BILITOT 0.4 09/18/2014 1433         Impression and Plan: Mr. Reinwald is 69 year old African-American male with metastatic bladder cancer. This is a poorly differentiated.  Again, everything looks fantastic. His weight is going up. He is feeling well. He is not having issues with pain.   The chemotherapy is definitely doing the job right now.  As always, his faith is strong.  We will go ahead and treat him today. This will be his fourth cycle treatment. After this cycle, we will repeat his scans and see how things look.  He will also  get his Zometa today.  I will like to think that with his weight holding steady and he doing so well, that we will see a continued response.  I spent about 30 minutes with him today.   [name], MD 7/19/201612:02 PM

## 2015-04-17 NOTE — Patient Instructions (Signed)
[facility] Discharge Instructions for Patients Receiving Chemotherapy  Today you received the following chemotherapy agents Gemzar, Carboplatin and Zometa.  To help prevent nausea and vomiting after your treatment, we encourage you to take your nausea medication.   If you develop nausea and vomiting that is not controlled by your nausea medication, call the clinic.   BELOW ARE SYMPTOMS THAT SHOULD BE REPORTED IMMEDIATELY:  *FEVER GREATER THAN 100.5 F  *CHILLS WITH OR WITHOUT FEVER  NAUSEA AND VOMITING THAT IS NOT CONTROLLED WITH YOUR NAUSEA MEDICATION  *UNUSUAL SHORTNESS OF BREATH  *UNUSUAL BRUISING OR BLEEDING  TENDERNESS IN MOUTH AND THROAT WITH OR WITHOUT PRESENCE OF ULCERS  *URINARY PROBLEMS  *BOWEL PROBLEMS  UNUSUAL RASH Items with * indicate a potential emergency and should be followed up as soon as possible.  Feel free to call the clinic you have any questions or concerns. The clinic phone number is [phone].  Please show the [place] at check-in to the Emergency Department and triage nurse.

## 2015-04-18 ENCOUNTER — Telehealth: Payer: Self-pay | Admitting: Hematology & Oncology

## 2015-04-18 NOTE — Telephone Encounter (Signed)
Lt mess regarding appt 7/26 and new calendar

## 2015-04-24 ENCOUNTER — Ambulatory Visit (HOSPITAL_BASED_OUTPATIENT_CLINIC_OR_DEPARTMENT_OTHER): Payer: Medicare Other

## 2015-04-24 ENCOUNTER — Other Ambulatory Visit (HOSPITAL_BASED_OUTPATIENT_CLINIC_OR_DEPARTMENT_OTHER): Payer: Medicare Other

## 2015-04-24 VITALS — BP 122/63 | HR 58 | Temp 97.7°F | Resp 20

## 2015-04-24 DIAGNOSIS — C679 Malignant neoplasm of bladder, unspecified: Secondary | ICD-10-CM

## 2015-04-24 DIAGNOSIS — C775 Secondary and unspecified malignant neoplasm of intrapelvic lymph nodes: Principal | ICD-10-CM

## 2015-04-24 DIAGNOSIS — Z5111 Encounter for antineoplastic chemotherapy: Secondary | ICD-10-CM

## 2015-04-24 DIAGNOSIS — C7951 Secondary malignant neoplasm of bone: Secondary | ICD-10-CM

## 2015-04-24 LAB — BASIC METABOLIC PANEL - CANCER CENTER ONLY
BUN, Bld: 17 mg/dL (ref 7–22)
CO2: 25 meq/L (ref 18–33)
Calcium: 9.5 mg/dL (ref 8.0–10.3)
Chloride: 104 mEq/L (ref 98–108)
Creat: 0.6 mg/dl (ref 0.6–1.2)
GLUCOSE: 81 mg/dL (ref 73–118)
Potassium: 3.9 mEq/L (ref 3.3–4.7)
Sodium: 135 mEq/L (ref 128–145)

## 2015-04-24 LAB — CBC WITH DIFFERENTIAL (CANCER CENTER ONLY)
BASO#: 0 10*3/uL (ref 0.0–0.2)
BASO%: 1 % (ref 0.0–2.0)
EOS%: 0.5 % (ref 0.0–7.0)
Eosinophils Absolute: 0 10*3/uL (ref 0.0–0.5)
HEMATOCRIT: 29.4 % — AB (ref 38.7–49.9)
HEMOGLOBIN: 10 g/dL — AB (ref 13.0–17.1)
LYMPH#: 1.1 10*3/uL (ref 0.9–3.3)
LYMPH%: 53.4 % — ABNORMAL HIGH (ref 14.0–48.0)
MCH: 31.7 pg (ref 28.0–33.4)
MCHC: 34 g/dL (ref 32.0–35.9)
MCV: 93 fL (ref 82–98)
MONO#: 0.2 10*3/uL (ref 0.1–0.9)
MONO%: 8.2 % (ref 0.0–13.0)
NEUT%: 36.9 % — ABNORMAL LOW (ref 40.0–80.0)
NEUTROS ABS: 0.8 10*3/uL — AB (ref 1.5–6.5)
Platelets: 361 10*3/uL (ref 145–400)
RBC: 3.15 10*6/uL — ABNORMAL LOW (ref 4.20–5.70)
RDW: 19 % — ABNORMAL HIGH (ref 11.1–15.7)
WBC: 2.1 10*3/uL — ABNORMAL LOW (ref 4.0–10.0)

## 2015-04-24 MED ORDER — GEMCITABINE HCL CHEMO INJECTION 1 GM/26.3ML
2000.0000 mg | Freq: Once | INTRAVENOUS | Status: AC
  Administered 2015-04-24: 2000 mg via INTRAVENOUS
  Filled 2015-04-24: qty 52.6

## 2015-04-24 MED ORDER — PROCHLORPERAZINE MALEATE 10 MG PO TABS
ORAL_TABLET | ORAL | Status: AC
  Filled 2015-04-24: qty 1

## 2015-04-24 MED ORDER — SODIUM CHLORIDE 0.9 % IV SOLN
Freq: Once | INTRAVENOUS | Status: AC
  Administered 2015-04-24: 10:00:00 via INTRAVENOUS

## 2015-04-24 MED ORDER — PROCHLORPERAZINE MALEATE 10 MG PO TABS
10.0000 mg | ORAL_TABLET | Freq: Once | ORAL | Status: AC
  Administered 2015-04-24: 10 mg via ORAL

## 2015-04-24 NOTE — Patient Instructions (Signed)
[facility] Discharge Instructions for Patients Receiving Chemotherapy  Today you received the following chemotherapy agents:  Gemzar  To help prevent nausea and vomiting after your treatment, we encourage you to take your nausea medications as directed.   If you develop nausea and vomiting that is not controlled by your nausea medication, call the clinic.   BELOW ARE SYMPTOMS THAT SHOULD BE REPORTED IMMEDIATELY:  *FEVER GREATER THAN 100.5 F  *CHILLS WITH OR WITHOUT FEVER  NAUSEA AND VOMITING THAT IS NOT CONTROLLED WITH YOUR NAUSEA MEDICATION  *UNUSUAL SHORTNESS OF BREATH  *UNUSUAL BRUISING OR BLEEDING  TENDERNESS IN MOUTH AND THROAT WITH OR WITHOUT PRESENCE OF ULCERS  *URINARY PROBLEMS  *BOWEL PROBLEMS  UNUSUAL RASH Items with * indicate a potential emergency and should be followed up as soon as possible.  Feel free to call the clinic you have any questions or concerns. The clinic phone number is [phone].  Please show the [facility] at check-in to the Emergency Department and triage nurse.

## 2015-04-24 NOTE — Progress Notes (Signed)
Labs reviewed by Dr. [name], [place] to treat with [place] 8.

## 2015-05-04 ENCOUNTER — Telehealth: Payer: Self-pay | Admitting: *Deleted

## 2015-05-04 ENCOUNTER — Encounter (HOSPITAL_COMMUNITY)
Admission: RE | Admit: 2015-05-04 | Discharge: 2015-05-04 | Disposition: A | Discharge status: Home / Self Care | Payer: Medicare Other | Source: Ambulatory Visit | Attending: Hematology & Oncology | Admitting: Hematology & Oncology

## 2015-05-04 DIAGNOSIS — C7989 Secondary malignant neoplasm of other specified sites: Secondary | ICD-10-CM | POA: Diagnosis not present

## 2015-05-04 DIAGNOSIS — C775 Secondary and unspecified malignant neoplasm of intrapelvic lymph nodes: Secondary | ICD-10-CM | POA: Insufficient documentation

## 2015-05-04 DIAGNOSIS — C679 Malignant neoplasm of bladder, unspecified: Secondary | ICD-10-CM | POA: Diagnosis not present

## 2015-05-04 DIAGNOSIS — C786 Secondary malignant neoplasm of retroperitoneum and peritoneum: Secondary | ICD-10-CM | POA: Diagnosis not present

## 2015-05-04 LAB — GLUCOSE, CAPILLARY: Glucose-Capillary: 81 mg/dL (ref 65–99)

## 2015-05-04 MED ORDER — FLUDEOXYGLUCOSE F - 18 (FDG) INJECTION
8.7000 | Freq: Once | INTRAVENOUS | Status: AC | PRN
  Administered 2015-05-04: 8.7 via INTRAVENOUS

## 2015-05-04 NOTE — Telephone Encounter (Addendum)
Patient is aware of results.   ----- Message from [name], MD sent at 05/04/2015 12:14 PM EDT ----- Call - cancer is still responding!!!  Great job!!  Laurey Arrow

## 2015-05-08 ENCOUNTER — Other Ambulatory Visit (HOSPITAL_BASED_OUTPATIENT_CLINIC_OR_DEPARTMENT_OTHER): Payer: Medicare Other

## 2015-05-08 ENCOUNTER — Ambulatory Visit (HOSPITAL_BASED_OUTPATIENT_CLINIC_OR_DEPARTMENT_OTHER): Payer: Medicare Other

## 2015-05-08 ENCOUNTER — Encounter: Payer: Self-pay | Admitting: Hematology & Oncology

## 2015-05-08 ENCOUNTER — Ambulatory Visit (HOSPITAL_BASED_OUTPATIENT_CLINIC_OR_DEPARTMENT_OTHER): Payer: Medicare Other | Admitting: Hematology & Oncology

## 2015-05-08 VITALS — BP 145/89 | HR 65 | Temp 97.9°F | Resp 20 | Wt 181.0 lb

## 2015-05-08 DIAGNOSIS — C7951 Secondary malignant neoplasm of bone: Secondary | ICD-10-CM

## 2015-05-08 DIAGNOSIS — C775 Secondary and unspecified malignant neoplasm of intrapelvic lymph nodes: Secondary | ICD-10-CM

## 2015-05-08 DIAGNOSIS — C679 Malignant neoplasm of bladder, unspecified: Secondary | ICD-10-CM

## 2015-05-08 DIAGNOSIS — Z5111 Encounter for antineoplastic chemotherapy: Secondary | ICD-10-CM | POA: Diagnosis not present

## 2015-05-08 LAB — BASIC METABOLIC PANEL - CANCER CENTER ONLY
BUN: 16 mg/dL (ref 7–22)
CALCIUM: 9.7 mg/dL (ref 8.0–10.3)
CO2: 26 meq/L (ref 18–33)
Chloride: 108 mEq/L (ref 98–108)
Creat: 1.2 mg/dl (ref 0.6–1.2)
Glucose, Bld: 95 mg/dL (ref 73–118)
Potassium: 4.5 mEq/L (ref 3.3–4.7)
Sodium: 142 mEq/L (ref 128–145)

## 2015-05-08 LAB — CBC WITH DIFFERENTIAL (CANCER CENTER ONLY)
BASO#: 0 10*3/uL (ref 0.0–0.2)
BASO%: 0.3 % (ref 0.0–2.0)
EOS%: 2.3 % (ref 0.0–7.0)
Eosinophils Absolute: 0.1 10*3/uL (ref 0.0–0.5)
HCT: 29.7 % — ABNORMAL LOW (ref 38.7–49.9)
HEMOGLOBIN: 10 g/dL — AB (ref 13.0–17.1)
LYMPH#: 1.1 10*3/uL (ref 0.9–3.3)
LYMPH%: 28 % (ref 14.0–48.0)
MCH: 32.6 pg (ref 28.0–33.4)
MCHC: 33.7 g/dL (ref 32.0–35.9)
MCV: 97 fL (ref 82–98)
MONO#: 0.7 10*3/uL (ref 0.1–0.9)
MONO%: 17.2 % — ABNORMAL HIGH (ref 0.0–13.0)
NEUT%: 52.2 % (ref 40.0–80.0)
NEUTROS ABS: 2.1 10*3/uL (ref 1.5–6.5)
Platelets: 95 10*3/uL — ABNORMAL LOW (ref 145–400)
RBC: 3.07 10*6/uL — AB (ref 4.20–5.70)
RDW: 21.2 % — AB (ref 11.1–15.7)
WBC: 4 10*3/uL (ref 4.0–10.0)

## 2015-05-08 MED ORDER — SODIUM CHLORIDE 0.9 % IV SOLN
473.0000 mg | Freq: Once | INTRAVENOUS | Status: AC
  Administered 2015-05-08: 470 mg via INTRAVENOUS
  Filled 2015-05-08: qty 47

## 2015-05-08 MED ORDER — SODIUM CHLORIDE 0.9 % IV SOLN
Freq: Once | INTRAVENOUS | Status: AC
  Administered 2015-05-08: 10:00:00 via INTRAVENOUS
  Filled 2015-05-08: qty 8

## 2015-05-08 MED ORDER — SODIUM CHLORIDE 0.9 % IV SOLN
2000.0000 mg | Freq: Once | INTRAVENOUS | Status: AC
  Administered 2015-05-08: 2000 mg via INTRAVENOUS
  Filled 2015-05-08: qty 52.6

## 2015-05-08 MED ORDER — ZOLEDRONIC ACID 4 MG/100ML IV SOLN
4.0000 mg | Freq: Once | INTRAVENOUS | Status: AC
Start: 2015-05-08 — End: 2015-05-08
  Administered 2015-05-08: 4 mg via INTRAVENOUS
  Filled 2015-05-08: qty 100

## 2015-05-08 MED ORDER — SODIUM CHLORIDE 0.9 % IV SOLN
Freq: Once | INTRAVENOUS | Status: AC
  Administered 2015-05-08: 10:00:00 via INTRAVENOUS

## 2015-05-08 NOTE — Progress Notes (Signed)
Hematology and Oncology Follow Up Visit  Dustin Jordan [id number] 1946/05/16 69 y.o. 05/08/2015   Principle Diagnosis:   Metastatic bladder cancer  Current Therapy:   Status post cycle  4 of carboplatin/Gemzar Zometa 4 mg IV every 3 weeks      Interim History:  Dustin Jordan is back for follow-up. He really looks good. He feels good. He's had no problems with nausea or vomiting. His appetite has been okay. He's had no issues with cough. He's had no change in bowel or bladder habits.  We went ahead and repeated a PET scan on him. The PET scan showed that he was still responding. There is some areas that may have had a little bit more activity but overall, there was a decrease in activity.  He has had no problems with leg swelling. He's had no issues with bowels or bladder.  He's had no fever.  Overall, his performance status is ECOG 1.    Medications:  Current outpatient prescriptions:  .  dexamethasone (DECADRON) 4 MG tablet, Take 2 tablets (8 mg total) by mouth 2 (two) times daily with a meal. Start the day after chemotherapy for 3 days., Disp: 30 tablet, Rfl: 1 .  hydrochlorothiazide (MICROZIDE) 12.5 MG capsule, Take 1 capsule (12.5 mg total) by mouth every morning., Disp: 90 capsule, Rfl: 3 .  ondansetron (ZOFRAN) 8 MG tablet, Take 1 tablet (8 mg total) by mouth 2 (two) times daily. Start the day after chemo for 3 days. Then take as needed for nausea or vomiting., Disp: 30 tablet, Rfl: 1 .  prochlorperazine (COMPAZINE) 10 MG tablet, Take 1 tablet (10 mg total) by mouth every 6 (six) hours as needed (Nausea or vomiting)., Disp: 30 tablet, Rfl: 1 .  tamsulosin (FLOMAX) 0.4 MG CAPS capsule, TAKE 1 CAPSULE BY MOUTH DAILY, Disp: 90 capsule, Rfl: 0  Allergies: No Known Allergies  Past Medical History, Surgical history, Social history, and Family History were reviewed and updated.  Review of Systems: As above  Physical Exam:  weight is 181 lb (82.101 kg). His oral temperature  is 97.9 F (36.6 C). His blood pressure is 145/89 and his pulse is 65. His respiration is 20.   Wt Readings from Last 3 Encounters:  05/08/15 181 lb (82.101 kg)  03/27/15 178 lb (80.74 kg)  03/06/15 174 lb (78.926 kg)     Well-developed and well-nourished African-American gentleman. Head and neck exam shows no ocular or oral lesions. There are no palpable cervical or supraclavicular lymph nodes. Lungs are clear. Cardiac exam regular rate and rhythm with no murmurs, rubs or bruits. Abdomen is soft. He has good bowel sounds. There is no fluid wave. There is no palpable abdominal mass. There is no palpable liver or spleen tip. Back exam shows no tenderness over the spine, ribs or hips. External shows no clubbing, cyanosis or edema. Neurological exam is nonfocal.  Lab Results  Component Value Date   WBC 4.0 05/08/2015   HGB 10.0* 05/08/2015   HCT 29.7* 05/08/2015   MCV 97 05/08/2015   PLT 95 Platelet count confirmed by slide estimate* 05/08/2015     Chemistry      Component Value Date/Time   NA 142 05/08/2015 0806   NA 138 01/19/2015 1210   K 4.5 05/08/2015 0806   K 3.9 01/19/2015 1210   CL 108 05/08/2015 0806   CL 102 01/19/2015 1210   CO2 26 05/08/2015 0806   CO2 27 01/19/2015 1210   BUN 16 05/08/2015 0806  BUN 14 01/19/2015 1210   CREATININE 1.2 05/08/2015 0806   CREATININE 1.14 01/19/2015 1210      Component Value Date/Time   CALCIUM 9.7 05/08/2015 0806   CALCIUM 9.8 01/19/2015 1210   ALKPHOS 70 04/17/2015 1100   ALKPHOS 81 09/18/2014 1433   AST 26 04/17/2015 1100   AST 26 09/18/2014 1433   ALT 16 04/17/2015 1100   ALT 21 09/18/2014 1433   BILITOT 0.80 04/17/2015 1100   BILITOT 0.4 09/18/2014 1433         Impression and Plan: Dustin Jordan is 69 year old African-American male with metastatic bladder cancer. This is a poorly differentiated.  Again, everything looks fantastic. His weight is going up. He is feeling well. He is not having issues with pain.   The  chemotherapy is definitely doing the job right now.  As always, his faith is strong.  We will go ahead and treat him today. His Plake as down a little bit. However, his monocytes are on the way up so this typically indicates marrow recovery.  I told him that we will continue to treat as long as he is responding to treatment.  I will plan to get him back in 3 more weeks.  I spent about 30 minutes with him today.   [name], MD 8/9/20169:17 AM

## 2015-05-08 NOTE — Patient Instructions (Signed)
[facility] Discharge Instructions for Patients Receiving Chemotherapy  Today you received the following chemotherapy agents Gemzar, Carboplatin and Zometa.  To help prevent nausea and vomiting after your treatment, we encourage you to take your nausea medication.   If you develop nausea and vomiting that is not controlled by your nausea medication, call the clinic.   BELOW ARE SYMPTOMS THAT SHOULD BE REPORTED IMMEDIATELY:  *FEVER GREATER THAN 100.5 F  *CHILLS WITH OR WITHOUT FEVER  NAUSEA AND VOMITING THAT IS NOT CONTROLLED WITH YOUR NAUSEA MEDICATION  *UNUSUAL SHORTNESS OF BREATH  *UNUSUAL BRUISING OR BLEEDING  TENDERNESS IN MOUTH AND THROAT WITH OR WITHOUT PRESENCE OF ULCERS  *URINARY PROBLEMS  *BOWEL PROBLEMS  UNUSUAL RASH Items with * indicate a potential emergency and should be followed up as soon as possible.  Feel free to call the clinic you have any questions or concerns. The clinic phone number is [phone].  Please show the [place] [facility] at check-in to the Emergency Department and triage nurse.

## 2015-05-15 ENCOUNTER — Ambulatory Visit (HOSPITAL_BASED_OUTPATIENT_CLINIC_OR_DEPARTMENT_OTHER): Payer: Medicare Other

## 2015-05-15 ENCOUNTER — Other Ambulatory Visit (HOSPITAL_BASED_OUTPATIENT_CLINIC_OR_DEPARTMENT_OTHER): Payer: Medicare Other

## 2015-05-15 VITALS — BP 117/62 | HR 62 | Temp 98.4°F | Resp 18

## 2015-05-15 DIAGNOSIS — C775 Secondary and unspecified malignant neoplasm of intrapelvic lymph nodes: Principal | ICD-10-CM

## 2015-05-15 DIAGNOSIS — Z5111 Encounter for antineoplastic chemotherapy: Secondary | ICD-10-CM | POA: Diagnosis not present

## 2015-05-15 DIAGNOSIS — C679 Malignant neoplasm of bladder, unspecified: Secondary | ICD-10-CM

## 2015-05-15 LAB — CBC WITH DIFFERENTIAL (CANCER CENTER ONLY)
BASO#: 0 10*3/uL (ref 0.0–0.2)
BASO%: 0.7 % (ref 0.0–2.0)
EOS ABS: 0 10*3/uL (ref 0.0–0.5)
EOS%: 1.4 % (ref 0.0–7.0)
HCT: 27.7 % — ABNORMAL LOW (ref 38.7–49.9)
HGB: 9.5 g/dL — ABNORMAL LOW (ref 13.0–17.1)
LYMPH#: 1.2 10*3/uL (ref 0.9–3.3)
LYMPH%: 40.8 % (ref 14.0–48.0)
MCH: 33.6 pg — AB (ref 28.0–33.4)
MCHC: 34.3 g/dL (ref 32.0–35.9)
MCV: 98 fL (ref 82–98)
MONO#: 0.2 10*3/uL (ref 0.1–0.9)
MONO%: 5.8 % (ref 0.0–13.0)
NEUT#: 1.5 10*3/uL (ref 1.5–6.5)
NEUT%: 51.3 % (ref 40.0–80.0)
PLATELETS: 239 10*3/uL (ref 145–400)
RBC: 2.83 10*6/uL — ABNORMAL LOW (ref 4.20–5.70)
RDW: 20.3 % — AB (ref 11.1–15.7)
WBC: 2.9 10*3/uL — ABNORMAL LOW (ref 4.0–10.0)

## 2015-05-15 LAB — CMP (CANCER CENTER ONLY)
ALBUMIN: 3.5 g/dL (ref 3.3–5.5)
ALT(SGPT): 26 U/L (ref 10–47)
AST: 34 U/L (ref 11–38)
Alkaline Phosphatase: 76 U/L (ref 26–84)
BUN, Bld: 19 mg/dL (ref 7–22)
CHLORIDE: 106 meq/L (ref 98–108)
CO2: 26 mEq/L (ref 18–33)
Calcium: 10.2 mg/dL (ref 8.0–10.3)
Creat: 1.2 mg/dl (ref 0.6–1.2)
Glucose, Bld: 91 mg/dL (ref 73–118)
Potassium: 4.6 mEq/L (ref 3.3–4.7)
Sodium: 144 mEq/L (ref 128–145)
Total Bilirubin: 0.6 mg/dl (ref 0.20–1.60)
Total Protein: 8.3 g/dL — ABNORMAL HIGH (ref 6.4–8.1)

## 2015-05-15 MED ORDER — LIDOCAINE-PRILOCAINE 2.5-2.5 % EX CREA: 1.0000 "application " | TOPICAL_CREAM | CUTANEOUS | Status: DC | PRN

## 2015-05-15 MED ORDER — SODIUM CHLORIDE 0.9 % IV SOLN
2000.0000 mg | Freq: Once | INTRAVENOUS | Status: AC
  Administered 2015-05-15: 2000 mg via INTRAVENOUS
  Filled 2015-05-15: qty 52.6

## 2015-05-15 MED ORDER — PROCHLORPERAZINE MALEATE 10 MG PO TABS
10.0000 mg | ORAL_TABLET | Freq: Once | ORAL | Status: AC
  Administered 2015-05-15: 10 mg via ORAL

## 2015-05-15 MED ORDER — PROCHLORPERAZINE MALEATE 10 MG PO TABS
ORAL_TABLET | ORAL | Status: AC
  Filled 2015-05-15: qty 1

## 2015-05-15 MED ORDER — SODIUM CHLORIDE 0.9 % IV SOLN
Freq: Once | INTRAVENOUS | Status: AC
  Administered 2015-05-15: 12:00:00 via INTRAVENOUS

## 2015-05-15 NOTE — Patient Instructions (Signed)

## 2015-05-17 ENCOUNTER — Other Ambulatory Visit: Payer: Self-pay | Admitting: *Deleted

## 2015-05-17 ENCOUNTER — Telehealth: Payer: Self-pay

## 2015-05-17 MED ORDER — TAMSULOSIN HCL 0.4 MG PO CAPS: 0.8000 mg | ORAL_CAPSULE | Freq: Every day | ORAL | Status: DC

## 2015-05-17 NOTE — Telephone Encounter (Signed)
Wants medication to be increased - taking two of them to be effective. tamsulosin (FLOMAX) 0.4 MG CAPS capsule  Patient wants a call back to inform him of the change and to know when to go and pick up the prescription.  [phone]

## 2015-05-17 NOTE — Telephone Encounter (Signed)
Can we increase medication?

## 2015-05-17 NOTE — Telephone Encounter (Signed)
I know Dr. Elder Cyphers has given him this in the past but he is followed by oncology now for bladder cancer. I would rather him talk to his oncologist about a dose increase and have the oncologist prescribe this for him.

## 2015-05-17 NOTE — Telephone Encounter (Signed)
Spoke to pt and informed him it would be preferred if he received the rx from his oncologist. He is going to call his oncologist and try to get it filled through them. I told pt to call and let us know if he has any problems.

## 2015-05-17 NOTE — Telephone Encounter (Signed)
Pt is needing a refill on flomax -he is having to double up on medication for it to work

## 2015-05-21 ENCOUNTER — Other Ambulatory Visit: Payer: Self-pay | Admitting: Radiology

## 2015-05-21 ENCOUNTER — Telehealth: Payer: Self-pay

## 2015-05-21 NOTE — Telephone Encounter (Addendum)
Pt is wanting to talk wiith someone about increasing a medication and getting a new refill he doubled up on the pills so he is out flomax

## 2015-05-22 ENCOUNTER — Encounter (HOSPITAL_COMMUNITY): Payer: Self-pay

## 2015-05-22 ENCOUNTER — Ambulatory Visit (HOSPITAL_COMMUNITY)
Admission: RE | Admit: 2015-05-22 | Discharge: 2015-05-22 | Disposition: A | Discharge status: Home / Self Care | Payer: Medicare Other | Source: Ambulatory Visit | Attending: Hematology & Oncology | Admitting: Hematology & Oncology

## 2015-05-22 ENCOUNTER — Other Ambulatory Visit: Payer: Self-pay | Admitting: Hematology & Oncology

## 2015-05-22 DIAGNOSIS — C775 Secondary and unspecified malignant neoplasm of intrapelvic lymph nodes: Principal | ICD-10-CM

## 2015-05-22 DIAGNOSIS — C679 Malignant neoplasm of bladder, unspecified: Secondary | ICD-10-CM | POA: Diagnosis not present

## 2015-05-22 DIAGNOSIS — D709 Neutropenia, unspecified: Secondary | ICD-10-CM | POA: Insufficient documentation

## 2015-05-22 LAB — CBC
HEMATOCRIT: 27.4 % — AB (ref 39.0–52.0)
Hemoglobin: 9.1 g/dL — ABNORMAL LOW (ref 13.0–17.0)
MCH: 32.6 pg (ref 26.0–34.0)
MCHC: 33.2 g/dL (ref 30.0–36.0)
MCV: 98.2 fL (ref 78.0–100.0)
Platelets: 136 10*3/uL — ABNORMAL LOW (ref 150–400)
RBC: 2.79 MIL/uL — ABNORMAL LOW (ref 4.22–5.81)
RDW: 20.6 % — AB (ref 11.5–15.5)
WBC: 1.6 10*3/uL — AB (ref 4.0–10.5)

## 2015-05-22 LAB — APTT: aPTT: 31 seconds (ref 24–37)

## 2015-05-22 LAB — PROTIME-INR
INR: 1.06 (ref 0.00–1.49)
Prothrombin Time: 14 seconds (ref 11.6–15.2)

## 2015-05-22 MED ORDER — CEFAZOLIN SODIUM-DEXTROSE 2-3 GM-% IV SOLR
2.0000 g | Freq: Once | INTRAVENOUS | Status: AC
  Administered 2015-05-22: 2 g via INTRAVENOUS

## 2015-05-22 MED ORDER — MIDAZOLAM HCL 2 MG/2ML IJ SOLN
INTRAMUSCULAR | Status: AC | PRN
  Administered 2015-05-22 (×4): 1 mg via INTRAVENOUS

## 2015-05-22 MED ORDER — CEFAZOLIN SODIUM-DEXTROSE 2-3 GM-% IV SOLR
INTRAVENOUS | Status: AC
  Filled 2015-05-22: qty 50

## 2015-05-22 MED ORDER — LIDOCAINE-EPINEPHRINE 2 %-1:100000 IJ SOLN
INTRAMUSCULAR | Status: AC
  Filled 2015-05-22: qty 1

## 2015-05-22 MED ORDER — FENTANYL CITRATE (PF) 100 MCG/2ML IJ SOLN
INTRAMUSCULAR | Status: AC | PRN
  Administered 2015-05-22 (×2): 25 ug via INTRAVENOUS
  Administered 2015-05-22: 50 ug via INTRAVENOUS

## 2015-05-22 MED ORDER — LIDOCAINE HCL 1 % IJ SOLN
INTRAMUSCULAR | Status: DC
Start: 2015-05-22 — End: 2015-05-23
  Filled 2015-05-22: qty 20

## 2015-05-22 MED ORDER — FENTANYL CITRATE (PF) 100 MCG/2ML IJ SOLN
INTRAMUSCULAR | Status: AC
  Filled 2015-05-22: qty 4

## 2015-05-22 MED ORDER — MIDAZOLAM HCL 2 MG/2ML IJ SOLN
INTRAMUSCULAR | Status: AC
  Filled 2015-05-22: qty 6

## 2015-05-22 MED ORDER — HEPARIN SOD (PORK) LOCK FLUSH 100 UNIT/ML IV SOLN
INTRAVENOUS | Status: AC
  Filled 2015-05-22: qty 5

## 2015-05-22 MED ORDER — SODIUM CHLORIDE 0.9 % IV SOLN
INTRAVENOUS | Status: DC
  Administered 2015-05-22: 500 mL via INTRAVENOUS

## 2015-05-22 MED ORDER — HEPARIN SOD (PORK) LOCK FLUSH 100 UNIT/ML IV SOLN
INTRAVENOUS | Status: AC | PRN
  Administered 2015-05-22: 500 [IU]

## 2015-05-22 NOTE — Procedures (Signed)
Interventional Radiology Procedure Note  Procedure: Placement of a right IJ approach single lumen PowerPort.  Tip is positioned at the superior cavoatrial junction and catheter is ready for immediate use.   Complications: No immediate  OK to use port.  Venetia Night. Kathlene Cote, M.D Pager:  [phone]

## 2015-05-22 NOTE — Discharge Instructions (Signed)

## 2015-05-22 NOTE — Progress Notes (Signed)
Patient ID: Dustin Jordan, male   DOB: 15-Aug-1946, 69 y.o.   MRN: [id number]    Referring Physician(s): Ennever,Peter R  Chief Complaint:  Bladder cancer  Subjective:  Pt familiar to IR service from prior biopsy of iliac lymph node on 01/26/15. He has hx of poorly differentiated metastatic bladder cancer and presents today for port a cath placement for chemotherapy. He is currently asymptomatic .  Allergies: Review of patient's allergies indicates no known allergies.  Medications: Prior to Admission medications   Medication Sig Start Date End Date Taking? Authorizing Provider  hydrochlorothiazide (MICROZIDE) 12.5 MG capsule Take 1 capsule (12.5 mg total) by mouth every morning. 04/25/14  Yes Leandrew Koyanagi, MD  tamsulosin (FLOMAX) 0.4 MG CAPS capsule TAKE 1 CAPSULE BY MOUTH DAILY 04/02/15  Yes Orma Flaming, MD  dexamethasone (DECADRON) 4 MG tablet Take 2 tablets (8 mg total) by mouth 2 (two) times daily with a meal. Start the day after chemotherapy for 3 days. 02/08/15   [name], MD  lidocaine-prilocaine (EMLA) cream Apply 1 application topically as needed. 05/15/15   [name], MD  ondansetron (ZOFRAN) 8 MG tablet Take 1 tablet (8 mg total) by mouth 2 (two) times daily. Start the day after chemo for 3 days. Then take as needed for nausea or vomiting. 02/08/15   [name], MD  prochlorperazine (COMPAZINE) 10 MG tablet Take 1 tablet (10 mg total) by mouth every 6 (six) hours as needed (Nausea or vomiting). 02/08/15   [name], MD  tamsulosin (FLOMAX) 0.4 MG CAPS capsule Take 2 capsules (0.8 mg total) by mouth daily. 05/17/15   [name], MD  Tetrahydrozoline HCl (VISINE OP) Apply 1-2 drops to eye daily as needed (redness.).    Historical Provider, MD     Vital Signs: BP 134/78 mmHg  Pulse 64  Temp(Src) 98.4 F (36.9 C) (Oral)  Resp 19  Ht 6\' 1"  (1.854 m)  Wt 176 lb 6 oz (80.003 kg)  BMI 23.27 kg/m2  SpO2 100%  Physical Exam pt awake/alert;  chest- CTA bilat; heart- RRR; abd- soft,+BS,NT; ext- FROM, no edema  Imaging: No results found.  Labs:  CBC:  Recent Labs  04/17/15 1100 04/24/15 0857 05/08/15 0806 05/15/15 1206  WBC 2.8* 2.1* 4.0 2.9*  HGB 10.2* 10.0* 10.0* 9.5*  HCT 30.0* 29.4* 29.7* 27.7*  PLT 254 361 95 Platelet count confirmed by slide estimate* 239    COAGS:  Recent Labs  01/26/15 0738  INR 1.02  APTT 31    BMP:  Recent Labs  01/19/15 1210  04/17/15 1100 04/24/15 0858 05/08/15 0806 05/15/15 1207  NA 138  < > 141 135 142 144  K 3.9  < > 4.3 3.9 4.5 4.6  CL 102  < > 109* 104 108 106  CO2 27  < > 25 25 26 26   GLUCOSE 92  < > 82 81 95 91  BUN 14  < > 18 17 16 19   CALCIUM 9.8  < > 9.3 9.5 9.7 10.2  CREATININE 1.14  < > 1.1 0.6 1.2 1.2  GFRNONAA 64*  --   --   --   --   --   GFRAA 74*  --   --   --   --   --   < > = values in this interval not displayed.  LIVER FUNCTION TESTS:  Recent Labs  09/18/14 1433  03/06/15 1115 03/12/15 1023 04/17/15 1100 05/15/15 1207  BILITOT  0.4  < > 0.70 1.00 0.80 0.60  AST 26  < > 22 39* 26 34  ALT 21  < > 20 38 16 26  ALKPHOS 81  < > 62 67 70 76  PROT 8.3  < > 7.9 7.9 8.1 8.3*  ALBUMIN 4.3  --   --   --   --   --   < > = values in this interval not displayed.  Assessment and Plan:  Pt with hx of metastatic bladder cancer; plan is for port a cath placement today for chemotherapy. Pt is neutropenic with WBC of 1.6 today. VSS;AF;  Will d/w Drs. Ennever/Yamagata before proceeding. Risks and benefits of port placement discussed with the patient including, but not limited to bleeding, infection, pneumothorax, or fibrin sheath development and need for additional procedures.All of the patient's questions were answered, patient is agreeable to proceed.Consent signed and in chart.    Signed: D. Rowe Robert 05/22/2015, 10:11 AM   I spent a total of 15 minutes at the the patient's bedside AND on the patient's hospital floor or unit, greater than 50% of  which was counseling/coordinating care for port a cath placement

## 2015-05-23 ENCOUNTER — Other Ambulatory Visit: Payer: Self-pay | Admitting: *Deleted

## 2015-05-23 DIAGNOSIS — C775 Secondary and unspecified malignant neoplasm of intrapelvic lymph nodes: Principal | ICD-10-CM

## 2015-05-23 DIAGNOSIS — C679 Malignant neoplasm of bladder, unspecified: Secondary | ICD-10-CM

## 2015-05-23 MED ORDER — TAMSULOSIN HCL 0.4 MG PO CAPS: 0.8000 mg | ORAL_CAPSULE | Freq: Every day | ORAL | Status: DC

## 2015-05-23 NOTE — Telephone Encounter (Signed)
Called pt and advised that Dr Antonieta Pert office sent in a Rx for #60, 2 caps QD on 05/17/15. Pt agreed to go and check with pharm.

## 2015-05-29 ENCOUNTER — Other Ambulatory Visit (HOSPITAL_BASED_OUTPATIENT_CLINIC_OR_DEPARTMENT_OTHER): Payer: Medicare Other

## 2015-05-29 ENCOUNTER — Ambulatory Visit (HOSPITAL_BASED_OUTPATIENT_CLINIC_OR_DEPARTMENT_OTHER): Payer: Medicare Other | Admitting: Family

## 2015-05-29 ENCOUNTER — Encounter: Payer: Self-pay | Admitting: Family

## 2015-05-29 ENCOUNTER — Ambulatory Visit (HOSPITAL_BASED_OUTPATIENT_CLINIC_OR_DEPARTMENT_OTHER): Payer: Medicare Other

## 2015-05-29 VITALS — BP 151/84 | HR 67 | Temp 97.7°F | Resp 18 | Ht 73.0 in | Wt 183.0 lb

## 2015-05-29 DIAGNOSIS — C775 Secondary and unspecified malignant neoplasm of intrapelvic lymph nodes: Secondary | ICD-10-CM | POA: Diagnosis not present

## 2015-05-29 DIAGNOSIS — C679 Malignant neoplasm of bladder, unspecified: Secondary | ICD-10-CM

## 2015-05-29 DIAGNOSIS — Z5111 Encounter for antineoplastic chemotherapy: Secondary | ICD-10-CM | POA: Diagnosis not present

## 2015-05-29 LAB — CMP (CANCER CENTER ONLY)
ALBUMIN: 3.3 g/dL (ref 3.3–5.5)
ALK PHOS: 63 U/L (ref 26–84)
ALT: 19 U/L (ref 10–47)
AST: 32 U/L (ref 11–38)
BILIRUBIN TOTAL: 0.6 mg/dL (ref 0.20–1.60)
BUN, Bld: 18 mg/dL (ref 7–22)
CALCIUM: 8.9 mg/dL (ref 8.0–10.3)
CO2: 25 meq/L (ref 18–33)
Chloride: 105 mEq/L (ref 98–108)
Creat: 1 mg/dl (ref 0.6–1.2)
GLUCOSE: 80 mg/dL (ref 73–118)
POTASSIUM: 4.2 meq/L (ref 3.3–4.7)
Sodium: 136 mEq/L (ref 128–145)
Total Protein: 7.8 g/dL (ref 6.4–8.1)

## 2015-05-29 LAB — CBC WITH DIFFERENTIAL (CANCER CENTER ONLY)
BASO#: 0 10*3/uL (ref 0.0–0.2)
BASO%: 0.7 % (ref 0.0–2.0)
EOS ABS: 0 10*3/uL (ref 0.0–0.5)
EOS%: 0.9 % (ref 0.0–7.0)
HEMATOCRIT: 28.6 % — AB (ref 38.7–49.9)
HEMOGLOBIN: 9.4 g/dL — AB (ref 13.0–17.1)
LYMPH#: 1.4 10*3/uL (ref 0.9–3.3)
LYMPH%: 31.3 % (ref 14.0–48.0)
MCH: 33.6 pg — ABNORMAL HIGH (ref 28.0–33.4)
MCHC: 32.9 g/dL (ref 32.0–35.9)
MCV: 102 fL — AB (ref 82–98)
MONO#: 0.9 10*3/uL (ref 0.1–0.9)
MONO%: 21 % — ABNORMAL HIGH (ref 0.0–13.0)
NEUT%: 46.1 % (ref 40.0–80.0)
NEUTROS ABS: 2 10*3/uL (ref 1.5–6.5)
Platelets: 87 10*3/uL — ABNORMAL LOW (ref 145–400)
RBC: 2.8 10*6/uL — ABNORMAL LOW (ref 4.20–5.70)
RDW: 20.9 % — AB (ref 11.1–15.7)
WBC: 4.3 10*3/uL (ref 4.0–10.0)

## 2015-05-29 MED ORDER — HEPARIN SOD (PORK) LOCK FLUSH 100 UNIT/ML IV SOLN
500.0000 [IU] | Freq: Once | INTRAVENOUS | Status: AC | PRN
  Administered 2015-05-29: 500 [IU]
  Filled 2015-05-29: qty 5

## 2015-05-29 MED ORDER — SODIUM CHLORIDE 0.9 % IV SOLN
2000.0000 mg | Freq: Once | INTRAVENOUS | Status: AC
  Administered 2015-05-29: 2000 mg via INTRAVENOUS
  Filled 2015-05-29: qty 52.6

## 2015-05-29 MED ORDER — ZOLEDRONIC ACID 4 MG/100ML IV SOLN
4.0000 mg | Freq: Once | INTRAVENOUS | Status: AC
Start: 2015-05-29 — End: 2015-05-29
  Administered 2015-05-29: 4 mg via INTRAVENOUS
  Filled 2015-05-29: qty 100

## 2015-05-29 MED ORDER — SODIUM CHLORIDE 0.9 % IV SOLN
Freq: Once | INTRAVENOUS | Status: AC
  Administered 2015-05-29: 13:00:00 via INTRAVENOUS
  Filled 2015-05-29: qty 8

## 2015-05-29 MED ORDER — SODIUM CHLORIDE 0.9 % IV SOLN
500.0000 mg | Freq: Once | INTRAVENOUS | Status: AC
  Administered 2015-05-29: 500 mg via INTRAVENOUS
  Filled 2015-05-29: qty 50

## 2015-05-29 MED ORDER — SODIUM CHLORIDE 0.9 % IJ SOLN
10.0000 mL | INTRAMUSCULAR | Status: DC | PRN
  Administered 2015-05-29: 10 mL
  Filled 2015-05-29: qty 10

## 2015-05-29 MED ORDER — SODIUM CHLORIDE 0.9 % IV SOLN
Freq: Once | INTRAVENOUS | Status: AC
  Administered 2015-05-29: 13:00:00 via INTRAVENOUS

## 2015-05-29 NOTE — Patient Instructions (Signed)
[facility] Discharge Instructions for Patients Receiving Chemotherapy  Today you received the following chemotherapy agents Carboplatin and Gemzar.  To help prevent nausea and vomiting after your treatment, we encourage you to take your nausea medication.   If you develop nausea and vomiting that is not controlled by your nausea medication, call the clinic.   BELOW ARE SYMPTOMS THAT SHOULD BE REPORTED IMMEDIATELY:  *FEVER GREATER THAN 100.5 F  *CHILLS WITH OR WITHOUT FEVER  NAUSEA AND VOMITING THAT IS NOT CONTROLLED WITH YOUR NAUSEA MEDICATION  *UNUSUAL SHORTNESS OF BREATH  *UNUSUAL BRUISING OR BLEEDING  TENDERNESS IN MOUTH AND THROAT WITH OR WITHOUT PRESENCE OF ULCERS  *URINARY PROBLEMS  *BOWEL PROBLEMS  UNUSUAL RASH Items with * indicate a potential emergency and should be followed up as soon as possible.  Feel free to call the clinic you have any questions or concerns. The clinic phone number is [phone].  Please show the [facility] at check-in to the Emergency Department and triage nurse.

## 2015-05-29 NOTE — Progress Notes (Signed)
Hematology and Oncology Follow Up Visit  Dustin Jordan [id number] 1945/11/10 69 y.o. 05/29/2015   Principle Diagnosis:  Metastatic bladder cancer  Current Therapy:   Carboplatin/Gemzar s/p cycle 5 Zometa 4 mg IV every 3 weeks    Interim History:  Dustin Jordan is here today for a follow-up and cycle 6 of chemo. He is doing very well and has no complaints at this time. He states that he "feels great."  He is staying active and walking 2-3 miles a day. His appetite is good and he is eating a healthy diet. He also stays well hydrated. His weight is stable.  He has had some mild SOB with exertion which he states comes and goes. He denies fever, chills, n/v, cough, rash, dizziness, chest pain, palpitations, abdominal pain. He has had no changes in his bowel or bladder habits. He has not noticed and blood in his urine or stool.  No swelling, tenderness, numbness or tingling in his extremities. He denies any new aches or pains.   Medications:    Medication List       This list is accurate as of: 05/29/15 11:31 AM.  Always use your most recent med list.               dexamethasone 4 MG tablet  Commonly known as:  DECADRON  Take 2 tablets (8 mg total) by mouth 2 (two) times daily with a meal. Start the day after chemotherapy for 3 days.     hydrochlorothiazide 12.5 MG capsule  Commonly known as:  MICROZIDE  Take 1 capsule (12.5 mg total) by mouth every morning.     lidocaine-prilocaine cream  Commonly known as:  EMLA  Apply 1 application topically as needed.     ondansetron 8 MG tablet  Commonly known as:  ZOFRAN  Take 1 tablet (8 mg total) by mouth 2 (two) times daily. Start the day after chemo for 3 days. Then take as needed for nausea or vomiting.     prochlorperazine 10 MG tablet  Commonly known as:  COMPAZINE  Take 1 tablet (10 mg total) by mouth every 6 (six) hours as needed (Nausea or vomiting).     tamsulosin 0.4 MG Caps capsule  Commonly known as:  FLOMAX  TAKE 1  CAPSULE BY MOUTH DAILY     tamsulosin 0.4 MG Caps capsule  Commonly known as:  FLOMAX  Take 2 capsules (0.8 mg total) by mouth daily.     VISINE OP  Apply 1-2 drops to eye daily as needed (redness.).        Allergies: No Known Allergies  Past Medical History, Surgical history, Social history, and Family History were reviewed and updated.  Review of Systems: All other 10 point review of systems is negative.   Physical Exam:  height is 6\' 1"  (1.854 m) and weight is 183 lb (83.008 kg). His oral temperature is 97.7 F (36.5 C). His blood pressure is 151/84 and his pulse is 67. His respiration is 18.   Wt Readings from Last 3 Encounters:  05/29/15 183 lb (83.008 kg)  05/22/15 176 lb 6 oz (80.003 kg)  05/08/15 181 lb (82.101 kg)    Ocular: Sclerae unicteric, pupils equal, round and reactive to light Ear-nose-throat: Oropharynx clear, dentition fair Lymphatic: No cervical or supraclavicular adenopathy Lungs no rales or rhonchi, good excursion bilaterally Heart regular rate and rhythm, no murmur appreciated Abd soft, nontender, positive bowel sounds MSK no focal spinal tenderness, no joint edema Neuro: non-focal, well-oriented,  appropriate affect Breasts: Deferred  Lab Results  Component Value Date   WBC 1.6* 05/22/2015   HGB 9.1* 05/22/2015   HCT 27.4* 05/22/2015   MCV 98.2 05/22/2015   PLT 136* 05/22/2015   No results found for: FERRITIN, IRON, TIBC, UIBC, IRONPCTSAT Lab Results  Component Value Date   RBC 2.79* 05/22/2015   No results found for: KPAFRELGTCHN, LAMBDASER, KAPLAMBRATIO No results found for: IGGSERUM, IGA, IGMSERUM No results found for: Odetta Pink, SPEI   Chemistry      Component Value Date/Time   NA 144 05/15/2015 1207   NA 138 01/19/2015 1210   K 4.6 05/15/2015 1207   K 3.9 01/19/2015 1210   CL 106 05/15/2015 1207   CL 102 01/19/2015 1210   CO2 26 05/15/2015 1207   CO2 27 01/19/2015 1210    BUN 19 05/15/2015 1207   BUN 14 01/19/2015 1210   CREATININE 1.2 05/15/2015 1207   CREATININE 1.14 01/19/2015 1210      Component Value Date/Time   CALCIUM 10.2 05/15/2015 1207   CALCIUM 9.8 01/19/2015 1210   ALKPHOS 76 05/15/2015 1207   ALKPHOS 81 09/18/2014 1433   AST 34 05/15/2015 1207   AST 26 09/18/2014 1433   ALT 26 05/15/2015 1207   ALT 21 09/18/2014 1433   BILITOT 0.60 05/15/2015 1207   BILITOT 0.4 09/18/2014 1433     Impression and Plan: Dustin Jordan is very pleasant 69 yo AfricanAmerican male with poorly differentiated metastatic bladder cancer. He is doing well with treatment and thankfully has not experienced much in the way of side effects. He is asymptomatic at this time and continues to have a great attitude.  His platelet count today is 87. He has had no episodes of bruising or bleeding. His Hgb is holding at 9.4.  We will proceed with cycle 6 of treatment today as planned.  His PET scan earlier this month showed a mixed response Some areas showed a little more activity but overall there was a decrease in activity.  He will follow-up with dr. [name] and plan for his next cycle of treatment on September 20th.  He knows to contact us with any questions or concerns. We can certainly see him sooner if need be.   Eliezer Bottom, NP 8/30/201611:31 AM

## 2015-06-01 ENCOUNTER — Other Ambulatory Visit: Payer: Self-pay | Admitting: *Deleted

## 2015-06-01 DIAGNOSIS — C679 Malignant neoplasm of bladder, unspecified: Secondary | ICD-10-CM

## 2015-06-01 DIAGNOSIS — C775 Secondary and unspecified malignant neoplasm of intrapelvic lymph nodes: Principal | ICD-10-CM

## 2015-06-05 ENCOUNTER — Other Ambulatory Visit (HOSPITAL_BASED_OUTPATIENT_CLINIC_OR_DEPARTMENT_OTHER): Payer: Medicare Other

## 2015-06-05 ENCOUNTER — Ambulatory Visit (HOSPITAL_BASED_OUTPATIENT_CLINIC_OR_DEPARTMENT_OTHER): Payer: Medicare Other

## 2015-06-05 VITALS — BP 156/77 | HR 64 | Temp 98.2°F | Resp 18

## 2015-06-05 DIAGNOSIS — Z5111 Encounter for antineoplastic chemotherapy: Secondary | ICD-10-CM

## 2015-06-05 DIAGNOSIS — C775 Secondary and unspecified malignant neoplasm of intrapelvic lymph nodes: Principal | ICD-10-CM

## 2015-06-05 DIAGNOSIS — C679 Malignant neoplasm of bladder, unspecified: Secondary | ICD-10-CM

## 2015-06-05 LAB — CBC WITH DIFFERENTIAL (CANCER CENTER ONLY)
BASO#: 0 10*3/uL (ref 0.0–0.2)
BASO%: 1.7 % (ref 0.0–2.0)
EOS%: 1.3 % (ref 0.0–7.0)
Eosinophils Absolute: 0 10*3/uL (ref 0.0–0.5)
HCT: 28.1 % — ABNORMAL LOW (ref 38.7–49.9)
HEMOGLOBIN: 9.2 g/dL — AB (ref 13.0–17.1)
LYMPH#: 1 10*3/uL (ref 0.9–3.3)
LYMPH%: 42.9 % (ref 14.0–48.0)
MCH: 33.2 pg (ref 28.0–33.4)
MCHC: 32.7 g/dL (ref 32.0–35.9)
MCV: 101 fL — ABNORMAL HIGH (ref 82–98)
MONO#: 0.1 10*3/uL (ref 0.1–0.9)
MONO%: 5.2 % (ref 0.0–13.0)
NEUT%: 48.9 % (ref 40.0–80.0)
NEUTROS ABS: 1.1 10*3/uL — AB (ref 1.5–6.5)
PLATELETS: 95 10*3/uL — AB (ref 145–400)
RBC: 2.77 10*6/uL — AB (ref 4.20–5.70)
RDW: 18.2 % — ABNORMAL HIGH (ref 11.1–15.7)
WBC: 2.3 10*3/uL — AB (ref 4.0–10.0)

## 2015-06-05 LAB — CMP (CANCER CENTER ONLY)
ALBUMIN: 3.3 g/dL (ref 3.3–5.5)
ALT(SGPT): 29 U/L (ref 10–47)
AST: 36 U/L (ref 11–38)
Alkaline Phosphatase: 66 U/L (ref 26–84)
BILIRUBIN TOTAL: 0.6 mg/dL (ref 0.20–1.60)
BUN: 20 mg/dL (ref 7–22)
CO2: 26 meq/L (ref 18–33)
CREATININE: 0.9 mg/dL (ref 0.6–1.2)
Calcium: 9.7 mg/dL (ref 8.0–10.3)
Chloride: 104 mEq/L (ref 98–108)
Glucose, Bld: 89 mg/dL (ref 73–118)
Potassium: 4.4 mEq/L (ref 3.3–4.7)
SODIUM: 138 meq/L (ref 128–145)
Total Protein: 8.1 g/dL (ref 6.4–8.1)

## 2015-06-05 MED ORDER — SODIUM CHLORIDE 0.9 % IV SOLN
Freq: Once | INTRAVENOUS | Status: AC
  Administered 2015-06-05: 12:00:00 via INTRAVENOUS

## 2015-06-05 MED ORDER — SODIUM CHLORIDE 0.9 % IJ SOLN
10.0000 mL | INTRAMUSCULAR | Status: DC | PRN
  Administered 2015-06-05: 10 mL
  Filled 2015-06-05: qty 10

## 2015-06-05 MED ORDER — PROCHLORPERAZINE MALEATE 10 MG PO TABS
ORAL_TABLET | ORAL | Status: AC
  Filled 2015-06-05: qty 1

## 2015-06-05 MED ORDER — GEMCITABINE HCL CHEMO INJECTION 1 GM/26.3ML
2000.0000 mg | Freq: Once | INTRAVENOUS | Status: AC
  Administered 2015-06-05: 2000 mg via INTRAVENOUS
  Filled 2015-06-05: qty 52.6

## 2015-06-05 MED ORDER — PROCHLORPERAZINE MALEATE 10 MG PO TABS
10.0000 mg | ORAL_TABLET | Freq: Once | ORAL | Status: AC
Start: 2015-06-05 — End: 2015-06-05
  Administered 2015-06-05: 10 mg via ORAL

## 2015-06-05 MED ORDER — HEPARIN SOD (PORK) LOCK FLUSH 100 UNIT/ML IV SOLN
500.0000 [IU] | Freq: Once | INTRAVENOUS | Status: AC | PRN
  Administered 2015-06-05: 500 [IU]
  Filled 2015-06-05: qty 5

## 2015-06-05 NOTE — Progress Notes (Signed)
Ok to treat per Dr [name] with ANC 1.1 and platelets 95.

## 2015-06-05 NOTE — Patient Instructions (Signed)
[facility] Discharge Instructions for Patients Receiving Chemotherapy  Today you received the following chemotherapy agents Gemzar.  To help prevent nausea and vomiting after your treatment, we encourage you to take your nausea medication.   If you develop nausea and vomiting that is not controlled by your nausea medication, call the clinic.   BELOW ARE SYMPTOMS THAT SHOULD BE REPORTED IMMEDIATELY:  *FEVER GREATER THAN 100.5 F  *CHILLS WITH OR WITHOUT FEVER  NAUSEA AND VOMITING THAT IS NOT CONTROLLED WITH YOUR NAUSEA MEDICATION  *UNUSUAL SHORTNESS OF BREATH  *UNUSUAL BRUISING OR BLEEDING  TENDERNESS IN MOUTH AND THROAT WITH OR WITHOUT PRESENCE OF ULCERS  *URINARY PROBLEMS  *BOWEL PROBLEMS  UNUSUAL RASH Items with * indicate a potential emergency and should be followed up as soon as possible.  Feel free to call the clinic you have any questions or concerns. The clinic phone number is [phone].  Please show the CHEMO ALERT CARD at check-in to the Emergency Department and triage nurse.   

## 2015-06-12 ENCOUNTER — Encounter: Payer: Self-pay | Admitting: Pharmacist

## 2015-06-19 ENCOUNTER — Telehealth: Payer: Self-pay | Admitting: Hematology & Oncology

## 2015-06-19 ENCOUNTER — Ambulatory Visit (HOSPITAL_BASED_OUTPATIENT_CLINIC_OR_DEPARTMENT_OTHER): Payer: Medicare Other | Admitting: Hematology & Oncology

## 2015-06-19 ENCOUNTER — Encounter: Payer: Self-pay | Admitting: Hematology & Oncology

## 2015-06-19 ENCOUNTER — Other Ambulatory Visit (HOSPITAL_BASED_OUTPATIENT_CLINIC_OR_DEPARTMENT_OTHER): Payer: Medicare Other

## 2015-06-19 ENCOUNTER — Ambulatory Visit (HOSPITAL_BASED_OUTPATIENT_CLINIC_OR_DEPARTMENT_OTHER): Payer: Medicare Other

## 2015-06-19 VITALS — BP 137/85 | HR 71 | Temp 98.1°F | Resp 16 | Ht 73.0 in | Wt 189.0 lb

## 2015-06-19 DIAGNOSIS — Z5111 Encounter for antineoplastic chemotherapy: Secondary | ICD-10-CM

## 2015-06-19 DIAGNOSIS — C775 Secondary and unspecified malignant neoplasm of intrapelvic lymph nodes: Secondary | ICD-10-CM | POA: Diagnosis not present

## 2015-06-19 DIAGNOSIS — C679 Malignant neoplasm of bladder, unspecified: Secondary | ICD-10-CM

## 2015-06-19 LAB — CBC WITH DIFFERENTIAL (CANCER CENTER ONLY)
BASO#: 0 10*3/uL (ref 0.0–0.2)
BASO%: 0.4 % (ref 0.0–2.0)
EOS ABS: 0 10*3/uL (ref 0.0–0.5)
EOS%: 0.8 % (ref 0.0–7.0)
HCT: 28.2 % — ABNORMAL LOW (ref 38.7–49.9)
HEMOGLOBIN: 9.1 g/dL — AB (ref 13.0–17.1)
LYMPH#: 1 10*3/uL (ref 0.9–3.3)
LYMPH%: 36.4 % (ref 14.0–48.0)
MCH: 33.8 pg — AB (ref 28.0–33.4)
MCHC: 32.3 g/dL (ref 32.0–35.9)
MCV: 105 fL — ABNORMAL HIGH (ref 82–98)
MONO#: 0.6 10*3/uL (ref 0.1–0.9)
MONO%: 24.1 % — ABNORMAL HIGH (ref 0.0–13.0)
NEUT%: 38.3 % — ABNORMAL LOW (ref 40.0–80.0)
NEUTROS ABS: 1 10*3/uL — AB (ref 1.5–6.5)
Platelets: 133 10*3/uL — ABNORMAL LOW (ref 145–400)
RBC: 2.69 10*6/uL — AB (ref 4.20–5.70)
RDW: 18.2 % — ABNORMAL HIGH (ref 11.1–15.7)
WBC: 2.6 10*3/uL — AB (ref 4.0–10.0)

## 2015-06-19 LAB — CMP (CANCER CENTER ONLY)
ALT(SGPT): 22 U/L (ref 10–47)
AST: 29 U/L (ref 11–38)
Albumin: 3.3 g/dL (ref 3.3–5.5)
Alkaline Phosphatase: 53 U/L (ref 26–84)
BUN, Bld: 15 mg/dL (ref 7–22)
CO2: 26 meq/L (ref 18–33)
Calcium: 9 mg/dL (ref 8.0–10.3)
Chloride: 105 meq/L (ref 98–108)
Creat: 1.2 mg/dL (ref 0.6–1.2)
Glucose, Bld: 86 mg/dL (ref 73–118)
Potassium: 4.3 meq/L (ref 3.3–4.7)
Sodium: 138 meq/L (ref 128–145)
Total Bilirubin: 0.6 mg/dL (ref 0.20–1.60)
Total Protein: 7.6 g/dL (ref 6.4–8.1)

## 2015-06-19 MED ORDER — CARBOPLATIN CHEMO INTRADERMAL TEST DOSE 100MCG/0.02ML
100.0000 ug | Freq: Once | INTRADERMAL | Status: AC
  Administered 2015-06-19: 100 ug via INTRADERMAL
  Filled 2015-06-19: qty 0.01

## 2015-06-19 MED ORDER — SODIUM CHLORIDE 0.9 % IV SOLN
2000.0000 mg | Freq: Once | INTRAVENOUS | Status: AC
  Administered 2015-06-19: 2000 mg via INTRAVENOUS
  Filled 2015-06-19: qty 52.6

## 2015-06-19 MED ORDER — HEPARIN SOD (PORK) LOCK FLUSH 100 UNIT/ML IV SOLN
500.0000 [IU] | Freq: Once | INTRAVENOUS | Status: AC | PRN
  Administered 2015-06-19: 500 [IU]
  Filled 2015-06-19: qty 5

## 2015-06-19 MED ORDER — SODIUM CHLORIDE 0.9 % IV SOLN: 1000.0000 mg/m2 | Freq: Once | INTRAVENOUS | Status: DC

## 2015-06-19 MED ORDER — SODIUM CHLORIDE 0.9 % IV SOLN
Freq: Once | INTRAVENOUS | Status: AC
  Administered 2015-06-19: 09:00:00 via INTRAVENOUS

## 2015-06-19 MED ORDER — SODIUM CHLORIDE 0.9 % IV SOLN
500.0000 mg | Freq: Once | INTRAVENOUS | Status: AC
  Administered 2015-06-19: 500 mg via INTRAVENOUS
  Filled 2015-06-19: qty 50

## 2015-06-19 MED ORDER — SODIUM CHLORIDE 0.9 % IJ SOLN
10.0000 mL | INTRAMUSCULAR | Status: DC | PRN
  Administered 2015-06-19: 10 mL
  Filled 2015-06-19: qty 10

## 2015-06-19 MED ORDER — ZOLEDRONIC ACID 4 MG/100ML IV SOLN
4.0000 mg | Freq: Once | INTRAVENOUS | Status: AC
  Administered 2015-06-19: 4 mg via INTRAVENOUS
  Filled 2015-06-19: qty 100

## 2015-06-19 MED ORDER — SODIUM CHLORIDE 0.9 % IV SOLN
Freq: Once | INTRAVENOUS | Status: AC
  Administered 2015-06-19: 12:00:00 via INTRAVENOUS
  Filled 2015-06-19: qty 8

## 2015-06-19 NOTE — Telephone Encounter (Signed)
Appt left as is (Appt 07/10/2015) due to no availability on 07/17/2015 per desk nurse.       [facility].

## 2015-06-19 NOTE — Progress Notes (Signed)
Hematology and Oncology Follow Up Visit  PREET PERRIER [id number] 11-14-1945 69 y.o. 06/19/2015   Principle Diagnosis:   Metastatic bladder cancer  Current Therapy:   Status post cycle  #6 of carboplatin/Gemzar Zometa 4 mg IV every 3 weeks      Interim History:  Mr. Rathman is back for follow-up. He really looks good. He feels good. He's had no problems with nausea or vomiting. His appetite has been okay. He's had no issues with cough. He's had no change in bowel or bladder habits.  He does complain of some slight swelling in the right leg. I told her this might be some that is chronic because of his recurrent disease.  He also has some degree of anemia which will cause some leg swelling.  Thank you, there is no pain.  He's had no rashes.  Overall, his performance status is ECOG 1.    Medications:  Current outpatient prescriptions:  .  dexamethasone (DECADRON) 4 MG tablet, Take 2 tablets (8 mg total) by mouth 2 (two) times daily with a meal. Start the day after chemotherapy for 3 days., Disp: 30 tablet, Rfl: 1 .  hydrochlorothiazide (MICROZIDE) 12.5 MG capsule, Take 1 capsule (12.5 mg total) by mouth every morning., Disp: 90 capsule, Rfl: 3 .  lidocaine-prilocaine (EMLA) cream, Apply 1 application topically as needed., Disp: 30 g, Rfl: 12 .  ondansetron (ZOFRAN) 8 MG tablet, Take 1 tablet (8 mg total) by mouth 2 (two) times daily. Start the day after chemo for 3 days. Then take as needed for nausea or vomiting., Disp: 30 tablet, Rfl: 1 .  prochlorperazine (COMPAZINE) 10 MG tablet, Take 1 tablet (10 mg total) by mouth every 6 (six) hours as needed (Nausea or vomiting)., Disp: 30 tablet, Rfl: 1 .  tamsulosin (FLOMAX) 0.4 MG CAPS capsule, TAKE 1 CAPSULE BY MOUTH DAILY, Disp: 90 capsule, Rfl: 0 .  tamsulosin (FLOMAX) 0.4 MG CAPS capsule, Take 2 capsules (0.8 mg total) by mouth daily., Disp: 60 capsule, Rfl: 2 .  Tetrahydrozoline HCl (VISINE OP), Apply 1-2 drops to eye daily as  needed (redness.)., Disp: , Rfl:   Allergies: No Known Allergies  Past Medical History, Surgical history, Social history, and Family History were reviewed and updated.  Review of Systems: As above  Physical Exam:  height is 6\' 1"  (1.854 m) and weight is 189 lb (85.73 kg). His oral temperature is 98.1 F (36.7 C). His blood pressure is 137/85 and his pulse is 71. His respiration is 16.   Wt Readings from Last 3 Encounters:  06/19/15 189 lb (85.73 kg)  05/29/15 183 lb (83.008 kg)  05/22/15 176 lb 6 oz (80.003 kg)     Well-developed and well-nourished African-American gentleman. Head and neck exam shows no ocular or oral lesions. There are no palpable cervical or supraclavicular lymph nodes. Lungs are clear. Cardiac exam regular rate and rhythm with no murmurs, rubs or bruits. Abdomen is soft. He has good bowel sounds. There is no fluid wave. There is no palpable abdominal mass. There is no palpable liver or spleen tip. Back exam shows no tenderness over the spine, ribs or hips. External shows no clubbing, cyanosis or edema. Neurological exam is nonfocal.  Lab Results  Component Value Date   WBC 2.3* 06/05/2015   HGB 9.2* 06/05/2015   HCT 28.1* 06/05/2015   MCV 101* 06/05/2015   PLT 95* 06/05/2015     Chemistry      Component Value Date/Time   NA 138 06/19/2015  0859   NA 138 01/19/2015 1210   K 4.3 06/19/2015 0859   K 3.9 01/19/2015 1210   CL 105 06/19/2015 0859   CL 102 01/19/2015 1210   CO2 26 06/19/2015 0859   CO2 27 01/19/2015 1210   BUN 15 06/19/2015 0859   BUN 14 01/19/2015 1210   CREATININE 1.2 06/19/2015 0859   CREATININE 1.14 01/19/2015 1210      Component Value Date/Time   CALCIUM 9.0 06/19/2015 0859   CALCIUM 9.8 01/19/2015 1210   ALKPHOS 53 06/19/2015 0859   ALKPHOS 81 09/18/2014 1433   AST 29 06/19/2015 0859   AST 26 09/18/2014 1433   ALT 22 06/19/2015 0859   ALT 21 09/18/2014 1433   BILITOT 0.60 06/19/2015 0859   BILITOT 0.4 09/18/2014 1433          Impression and Plan: Mr. Selders is 69 year old African-American male with metastatic bladder cancer. This is poorly differentiated.  After this cycle of chemotherapy, I will then repeat his PET scan.  I think if he has signs of progressive disease, we can now go with a new immunotherapy option that the FDA has approved- Tecentriq.  I will plan to get him back in another 3 weeks.   [name], MD 9/20/20169:48 AM

## 2015-06-19 NOTE — Patient Instructions (Signed)
[facility] Discharge Instructions for Patients Receiving Chemotherapy  Today you received the following chemotherapy agents Carboplatin and Gemzar.  To help prevent nausea and vomiting after your treatment, we encourage you to take your nausea medication.   If you develop nausea and vomiting that is not controlled by your nausea medication, call the clinic.   BELOW ARE SYMPTOMS THAT SHOULD BE REPORTED IMMEDIATELY:  *FEVER GREATER THAN 100.5 F  *CHILLS WITH OR WITHOUT FEVER  NAUSEA AND VOMITING THAT IS NOT CONTROLLED WITH YOUR NAUSEA MEDICATION  *UNUSUAL SHORTNESS OF BREATH  *UNUSUAL BRUISING OR BLEEDING  TENDERNESS IN MOUTH AND THROAT WITH OR WITHOUT PRESENCE OF ULCERS  *URINARY PROBLEMS  *BOWEL PROBLEMS  UNUSUAL RASH Items with * indicate a potential emergency and should be followed up as soon as possible.  Feel free to call the clinic you have any questions or concerns. The clinic phone number is [phone].  Please show the [facility] at check-in to the Emergency Department and triage nurse.

## 2015-06-26 ENCOUNTER — Other Ambulatory Visit (HOSPITAL_BASED_OUTPATIENT_CLINIC_OR_DEPARTMENT_OTHER): Payer: Medicare Other

## 2015-06-26 ENCOUNTER — Ambulatory Visit (HOSPITAL_BASED_OUTPATIENT_CLINIC_OR_DEPARTMENT_OTHER): Payer: Medicare Other

## 2015-06-26 VITALS — BP 132/81 | HR 62 | Temp 98.1°F | Resp 20

## 2015-06-26 DIAGNOSIS — C679 Malignant neoplasm of bladder, unspecified: Secondary | ICD-10-CM

## 2015-06-26 DIAGNOSIS — C775 Secondary and unspecified malignant neoplasm of intrapelvic lymph nodes: Principal | ICD-10-CM

## 2015-06-26 DIAGNOSIS — Z5111 Encounter for antineoplastic chemotherapy: Secondary | ICD-10-CM

## 2015-06-26 LAB — CBC WITH DIFFERENTIAL (CANCER CENTER ONLY)
BASO#: 0 10*3/uL (ref 0.0–0.2)
BASO%: 1.5 % (ref 0.0–2.0)
EOS%: 0.5 % (ref 0.0–7.0)
Eosinophils Absolute: 0 10*3/uL (ref 0.0–0.5)
HEMATOCRIT: 27.7 % — AB (ref 38.7–49.9)
HEMOGLOBIN: 9 g/dL — AB (ref 13.0–17.1)
LYMPH#: 0.8 10*3/uL — AB (ref 0.9–3.3)
LYMPH%: 40 % (ref 14.0–48.0)
MCH: 33.7 pg — ABNORMAL HIGH (ref 28.0–33.4)
MCHC: 32.5 g/dL (ref 32.0–35.9)
MCV: 104 fL — ABNORMAL HIGH (ref 82–98)
MONO#: 0.2 10*3/uL (ref 0.1–0.9)
MONO%: 10.2 % (ref 0.0–13.0)
NEUT%: 47.8 % (ref 40.0–80.0)
NEUTROS ABS: 1 10*3/uL — AB (ref 1.5–6.5)
Platelets: 177 10*3/uL (ref 145–400)
RBC: 2.67 10*6/uL — ABNORMAL LOW (ref 4.20–5.70)
RDW: 16.4 % — AB (ref 11.1–15.7)
WBC: 2.1 10*3/uL — ABNORMAL LOW (ref 4.0–10.0)

## 2015-06-26 LAB — CMP (CANCER CENTER ONLY)
ALBUMIN: 3.5 g/dL (ref 3.3–5.5)
ALT(SGPT): 33 U/L (ref 10–47)
AST: 36 U/L (ref 11–38)
Alkaline Phosphatase: 50 U/L (ref 26–84)
BILIRUBIN TOTAL: 0.6 mg/dL (ref 0.20–1.60)
BUN: 17 mg/dL (ref 7–22)
CO2: 26 meq/L (ref 18–33)
CREATININE: 0.8 mg/dL (ref 0.6–1.2)
Calcium: 9.4 mg/dL (ref 8.0–10.3)
Chloride: 103 mEq/L (ref 98–108)
Glucose, Bld: 87 mg/dL (ref 73–118)
Potassium: 4.3 mEq/L (ref 3.3–4.7)
SODIUM: 139 meq/L (ref 128–145)
Total Protein: 8.2 g/dL — ABNORMAL HIGH (ref 6.4–8.1)

## 2015-06-26 LAB — LACTATE DEHYDROGENASE: LDH: 219 U/L (ref 94–250)

## 2015-06-26 MED ORDER — PROCHLORPERAZINE MALEATE 10 MG PO TABS
ORAL_TABLET | ORAL | Status: AC
  Filled 2015-06-26: qty 1

## 2015-06-26 MED ORDER — PROCHLORPERAZINE MALEATE 10 MG PO TABS
10.0000 mg | ORAL_TABLET | Freq: Once | ORAL | Status: AC
  Administered 2015-06-26: 10 mg via ORAL

## 2015-06-26 MED ORDER — SODIUM CHLORIDE 0.9 % IV SOLN
Freq: Once | INTRAVENOUS | Status: AC
  Administered 2015-06-26: 12:00:00 via INTRAVENOUS

## 2015-06-26 MED ORDER — SODIUM CHLORIDE 0.9 % IV SOLN
2000.0000 mg | Freq: Once | INTRAVENOUS | Status: AC
  Administered 2015-06-26: 2000 mg via INTRAVENOUS
  Filled 2015-06-26: qty 52.6

## 2015-06-26 MED ORDER — SODIUM CHLORIDE 0.9 % IJ SOLN
10.0000 mL | INTRAMUSCULAR | Status: DC | PRN
  Administered 2015-06-26: 10 mL
  Filled 2015-06-26: qty 10

## 2015-06-26 MED ORDER — HEPARIN SOD (PORK) LOCK FLUSH 100 UNIT/ML IV SOLN
500.0000 [IU] | Freq: Once | INTRAVENOUS | Status: AC | PRN
  Administered 2015-06-26: 500 [IU]
  Filled 2015-06-26: qty 5

## 2015-06-26 NOTE — Patient Instructions (Signed)
[facility] Discharge Instructions for Patients Receiving Chemotherapy  Today you received the following chemotherapy agents Gemzar.  To help prevent nausea and vomiting after your treatment, we encourage you to take your nausea medication.   If you develop nausea and vomiting that is not controlled by your nausea medication, call the clinic.   BELOW ARE SYMPTOMS THAT SHOULD BE REPORTED IMMEDIATELY:  *FEVER GREATER THAN 100.5 F  *CHILLS WITH OR WITHOUT FEVER  NAUSEA AND VOMITING THAT IS NOT CONTROLLED WITH YOUR NAUSEA MEDICATION  *UNUSUAL SHORTNESS OF BREATH  *UNUSUAL BRUISING OR BLEEDING  TENDERNESS IN MOUTH AND THROAT WITH OR WITHOUT PRESENCE OF ULCERS  *URINARY PROBLEMS  *BOWEL PROBLEMS  UNUSUAL RASH Items with * indicate a potential emergency and should be followed up as soon as possible.  Feel free to call the clinic you have any questions or concerns. The clinic phone number is [phone].  Please show the CHEMO ALERT CARD at check-in to the Emergency Department and triage nurse.   

## 2015-06-26 NOTE — Progress Notes (Signed)
Per DR. Ennever ok to treat with Neut# 1

## 2015-07-10 ENCOUNTER — Ambulatory Visit (HOSPITAL_BASED_OUTPATIENT_CLINIC_OR_DEPARTMENT_OTHER): Payer: Medicare Other

## 2015-07-10 ENCOUNTER — Ambulatory Visit (HOSPITAL_BASED_OUTPATIENT_CLINIC_OR_DEPARTMENT_OTHER): Payer: Medicare Other | Admitting: Family

## 2015-07-10 ENCOUNTER — Encounter: Payer: Self-pay | Admitting: Family

## 2015-07-10 ENCOUNTER — Other Ambulatory Visit (HOSPITAL_BASED_OUTPATIENT_CLINIC_OR_DEPARTMENT_OTHER): Payer: Medicare Other

## 2015-07-10 VITALS — BP 128/77 | HR 69 | Temp 97.7°F | Resp 16 | Ht 73.0 in | Wt 186.0 lb

## 2015-07-10 DIAGNOSIS — Z5111 Encounter for antineoplastic chemotherapy: Secondary | ICD-10-CM | POA: Diagnosis not present

## 2015-07-10 DIAGNOSIS — C775 Secondary and unspecified malignant neoplasm of intrapelvic lymph nodes: Secondary | ICD-10-CM

## 2015-07-10 DIAGNOSIS — C7951 Secondary malignant neoplasm of bone: Secondary | ICD-10-CM

## 2015-07-10 DIAGNOSIS — C679 Malignant neoplasm of bladder, unspecified: Secondary | ICD-10-CM | POA: Diagnosis not present

## 2015-07-10 LAB — CBC WITH DIFFERENTIAL (CANCER CENTER ONLY)
BASO#: 0 10*3/uL (ref 0.0–0.2)
BASO%: 0.6 % (ref 0.0–2.0)
EOS%: 1.2 % (ref 0.0–7.0)
Eosinophils Absolute: 0 10*3/uL (ref 0.0–0.5)
HEMATOCRIT: 29.4 % — AB (ref 38.7–49.9)
HGB: 9.6 g/dL — ABNORMAL LOW (ref 13.0–17.1)
LYMPH#: 1 10*3/uL (ref 0.9–3.3)
LYMPH%: 30.2 % (ref 14.0–48.0)
MCH: 34.5 pg — ABNORMAL HIGH (ref 28.0–33.4)
MCHC: 32.7 g/dL (ref 32.0–35.9)
MCV: 106 fL — ABNORMAL HIGH (ref 82–98)
MONO#: 0.8 10*3/uL (ref 0.1–0.9)
MONO%: 24 % — AB (ref 0.0–13.0)
NEUT#: 1.4 10*3/uL — ABNORMAL LOW (ref 1.5–6.5)
NEUT%: 44 % (ref 40.0–80.0)
PLATELETS: 78 10*3/uL — AB (ref 145–400)
RBC: 2.78 10*6/uL — ABNORMAL LOW (ref 4.20–5.70)
RDW: 17 % — AB (ref 11.1–15.7)
WBC: 3.3 10*3/uL — ABNORMAL LOW (ref 4.0–10.0)

## 2015-07-10 LAB — BASIC METABOLIC PANEL - CANCER CENTER ONLY
BUN: 16 mg/dL (ref 7–22)
CO2: 26 mEq/L (ref 18–33)
Calcium: 9.6 mg/dL (ref 8.0–10.3)
Chloride: 105 mEq/L (ref 98–108)
Creat: 1 mg/dl (ref 0.6–1.2)
Glucose, Bld: 86 mg/dL (ref 73–118)
POTASSIUM: 4.1 meq/L (ref 3.3–4.7)
Sodium: 137 mEq/L (ref 128–145)

## 2015-07-10 MED ORDER — ZOLEDRONIC ACID 4 MG/100ML IV SOLN
4.0000 mg | Freq: Once | INTRAVENOUS | Status: AC
  Administered 2015-07-10: 4 mg via INTRAVENOUS
  Filled 2015-07-10: qty 100

## 2015-07-10 MED ORDER — GEMCITABINE HCL CHEMO INJECTION 1 GM/26.3ML
2000.0000 mg | Freq: Once | INTRAVENOUS | Status: AC
  Administered 2015-07-10: 2000 mg via INTRAVENOUS
  Filled 2015-07-10: qty 52.6

## 2015-07-10 MED ORDER — SODIUM CHLORIDE 0.9 % IJ SOLN
10.0000 mL | INTRAMUSCULAR | Status: DC | PRN
  Administered 2015-07-10: 10 mL
  Filled 2015-07-10: qty 10

## 2015-07-10 MED ORDER — SODIUM CHLORIDE 0.9 % IV SOLN
Freq: Once | INTRAVENOUS | Status: AC
  Administered 2015-07-10: 13:00:00 via INTRAVENOUS
  Filled 2015-07-10: qty 8

## 2015-07-10 MED ORDER — HEPARIN SOD (PORK) LOCK FLUSH 100 UNIT/ML IV SOLN
500.0000 [IU] | Freq: Once | INTRAVENOUS | Status: AC | PRN
  Administered 2015-07-10: 500 [IU]
  Filled 2015-07-10: qty 5

## 2015-07-10 MED ORDER — SODIUM CHLORIDE 0.9 % IV SOLN
Freq: Once | INTRAVENOUS | Status: AC
  Administered 2015-07-10: 13:00:00 via INTRAVENOUS

## 2015-07-10 MED ORDER — SODIUM CHLORIDE 0.9 % IV SOLN
500.0000 mg | Freq: Once | INTRAVENOUS | Status: AC
  Administered 2015-07-10: 500 mg via INTRAVENOUS
  Filled 2015-07-10: qty 50

## 2015-07-10 MED ORDER — CARBOPLATIN CHEMO INTRADERMAL TEST DOSE 100MCG/0.02ML
100.0000 ug | Freq: Once | INTRADERMAL | Status: AC
  Administered 2015-07-10: 100 ug via INTRADERMAL
  Filled 2015-07-10: qty 0.02

## 2015-07-10 NOTE — Patient Instructions (Signed)
[facility] Discharge Instructions for Patients Receiving Chemotherapy  Today you received the following chemotherapy agents Carboplatin and Gemzar.  To help prevent nausea and vomiting after your treatment, we encourage you to take your nausea medication.   If you develop nausea and vomiting that is not controlled by your nausea medication, call the clinic.   BELOW ARE SYMPTOMS THAT SHOULD BE REPORTED IMMEDIATELY:  *FEVER GREATER THAN 100.5 F  *CHILLS WITH OR WITHOUT FEVER  NAUSEA AND VOMITING THAT IS NOT CONTROLLED WITH YOUR NAUSEA MEDICATION  *UNUSUAL SHORTNESS OF BREATH  *UNUSUAL BRUISING OR BLEEDING  TENDERNESS IN MOUTH AND THROAT WITH OR WITHOUT PRESENCE OF ULCERS  *URINARY PROBLEMS  *BOWEL PROBLEMS  UNUSUAL RASH Items with * indicate a potential emergency and should be followed up as soon as possible.  Feel free to call the clinic you have any questions or concerns. The clinic phone number is [phone].  Please show the [facility] at check-in to the Emergency Department and triage nurse.

## 2015-07-10 NOTE — Progress Notes (Signed)
Hematology and Oncology Follow Up Visit  JOSSUE RUBENSTEIN [id number] Feb 05, 1946 69 y.o. 07/10/2015   Principle Diagnosis:  Metastatic bladder cancer  Current Therapy:   Carboplatin/Gemzar s/p cycle 7 Zometa 4 mg IV every 3 weeks    Interim History:  Mr. Lipke is here today for a follow-up and cycle 8 of chemo. He continues to do well. He still has some mild swelling in his right lower leg and ankle.  He is due for a PET scan and will have this tomorrow.  His Hgb is holding at 9.6. His platelet count is down at 78. He has had no episodes of bleeding or bruising.   He has some mild SOB with exertion. This is not a new issue for him and he attributes it to being a former smoker.  He denies fever, chills, n/v, cough, rash, dizziness, chest pain, palpitations, abdominal pain or changes in his bowel or bladder habits. No numbness or tingling in his extremities. He denies any aches or pains.  He is eating well and staying hydrated. His weight is down 3 lbs since his last visit.   Medications:    Medication List       This list is accurate as of: 07/10/15 11:19 AM.  Always use your most recent med list.               dexamethasone 4 MG tablet  Commonly known as:  DECADRON  Take 2 tablets (8 mg total) by mouth 2 (two) times daily with a meal. Start the day after chemotherapy for 3 days.     hydrochlorothiazide 12.5 MG capsule  Commonly known as:  MICROZIDE  Take 1 capsule (12.5 mg total) by mouth every morning.     lidocaine-prilocaine cream  Commonly known as:  EMLA  Apply 1 application topically as needed.     ondansetron 8 MG tablet  Commonly known as:  ZOFRAN  Take 1 tablet (8 mg total) by mouth 2 (two) times daily. Start the day after chemo for 3 days. Then take as needed for nausea or vomiting.     prochlorperazine 10 MG tablet  Commonly known as:  COMPAZINE  Take 1 tablet (10 mg total) by mouth every 6 (six) hours as needed (Nausea or vomiting).     tamsulosin  0.4 MG Caps capsule  Commonly known as:  FLOMAX  TAKE 1 CAPSULE BY MOUTH DAILY     tamsulosin 0.4 MG Caps capsule  Commonly known as:  FLOMAX  Take 2 capsules (0.8 mg total) by mouth daily.     VISINE OP  Apply 1-2 drops to eye daily as needed (redness.).        Allergies: No Known Allergies  Past Medical History, Surgical history, Social history, and Family History were reviewed and updated.  Review of Systems: All other 10 point review of systems is negative.   Physical Exam:  vitals were not taken for this visit.  Wt Readings from Last 3 Encounters:  06/19/15 189 lb (85.73 kg)  05/29/15 183 lb (83.008 kg)  05/22/15 176 lb 6 oz (80.003 kg)    Ocular: Sclerae unicteric, pupils equal, round and reactive to light Ear-nose-throat: Oropharynx clear, dentition fair Lymphatic: No cervical or supraclavicular adenopathy Lungs no rales or rhonchi, good excursion bilaterally Heart regular rate and rhythm, no murmur appreciated Abd soft, nontender, positive bowel sounds MSK no focal spinal tenderness, no joint edema Neuro: non-focal, well-oriented, appropriate affect Breasts: Deferred  Lab Results  Component Value Date  WBC 2.1* 06/26/2015   HGB 9.0* 06/26/2015   HCT 27.7* 06/26/2015   MCV 104* 06/26/2015   PLT 177 06/26/2015   No results found for: FERRITIN, IRON, TIBC, UIBC, IRONPCTSAT Lab Results  Component Value Date   RBC 2.67* 06/26/2015   No results found for: KPAFRELGTCHN, LAMBDASER, KAPLAMBRATIO No results found for: IGGSERUM, IGA, IGMSERUM No results found for: Odetta Pink, SPEI   Chemistry      Component Value Date/Time   NA 139 06/26/2015 1054   NA 138 01/19/2015 1210   K 4.3 06/26/2015 1054   K 3.9 01/19/2015 1210   CL 103 06/26/2015 1054   CL 102 01/19/2015 1210   CO2 26 06/26/2015 1054   CO2 27 01/19/2015 1210   BUN 17 06/26/2015 1054   BUN 14 01/19/2015 1210   CREATININE 0.8 06/26/2015  1054   CREATININE 1.14 01/19/2015 1210      Component Value Date/Time   CALCIUM 9.4 06/26/2015 1054   CALCIUM 9.8 01/19/2015 1210   ALKPHOS 50 06/26/2015 1054   ALKPHOS 81 09/18/2014 1433   AST 36 06/26/2015 1054   AST 26 09/18/2014 1433   ALT 33 06/26/2015 1054   ALT 21 09/18/2014 1433   BILITOT 0.60 06/26/2015 1054   BILITOT 0.4 09/18/2014 1433     Impression and Plan: Mr. Szilagyi is very pleasant 69 yo AfricanAmerican male with poorly differentiated metastatic bladder cancer. He continues to do well with treatment and is asymptomatic at this time.  His platelet count is 78 with a Hgb of 9.6.  We will proceed with cycle 8 of treatment today as planned.  He will have his PET scan tomorrow to evaluate his response and adjust his therapy accordingly.  He will get a new appointment schedule today.  He knows to contact us with any questions or concerns. We can certainly see him sooner if need be.   Eliezer Bottom, NP 10/11/201611:19 AM

## 2015-07-11 ENCOUNTER — Ambulatory Visit (HOSPITAL_COMMUNITY)
Admission: RE | Admit: 2015-07-11 | Discharge: 2015-07-11 | Disposition: A | Discharge status: Home / Self Care | Payer: Medicare Other | Source: Ambulatory Visit | Attending: Hematology & Oncology | Admitting: Hematology & Oncology

## 2015-07-11 DIAGNOSIS — C775 Secondary and unspecified malignant neoplasm of intrapelvic lymph nodes: Secondary | ICD-10-CM | POA: Diagnosis not present

## 2015-07-11 DIAGNOSIS — C679 Malignant neoplasm of bladder, unspecified: Secondary | ICD-10-CM | POA: Diagnosis not present

## 2015-07-11 DIAGNOSIS — Z9221 Personal history of antineoplastic chemotherapy: Secondary | ICD-10-CM | POA: Insufficient documentation

## 2015-07-11 DIAGNOSIS — C7989 Secondary malignant neoplasm of other specified sites: Secondary | ICD-10-CM | POA: Diagnosis not present

## 2015-07-11 LAB — GLUCOSE, CAPILLARY: Glucose-Capillary: 110 mg/dL — ABNORMAL HIGH (ref 65–99)

## 2015-07-11 MED ORDER — FLUDEOXYGLUCOSE F - 18 (FDG) INJECTION
9.3000 | Freq: Once | INTRAVENOUS | Status: DC | PRN
  Administered 2015-07-11: 9.3 via INTRAVENOUS
  Filled 2015-07-11: qty 9.3

## 2015-07-15 ENCOUNTER — Other Ambulatory Visit: Payer: Self-pay | Admitting: Hematology & Oncology

## 2015-07-17 ENCOUNTER — Ambulatory Visit (HOSPITAL_BASED_OUTPATIENT_CLINIC_OR_DEPARTMENT_OTHER): Payer: Medicare Other

## 2015-07-17 ENCOUNTER — Ambulatory Visit: Payer: Medicare Other

## 2015-07-17 ENCOUNTER — Other Ambulatory Visit (HOSPITAL_BASED_OUTPATIENT_CLINIC_OR_DEPARTMENT_OTHER): Payer: Medicare Other

## 2015-07-17 ENCOUNTER — Other Ambulatory Visit: Payer: Medicare Other

## 2015-07-17 VITALS — BP 119/63 | HR 65 | Temp 97.6°F | Resp 18

## 2015-07-17 DIAGNOSIS — Z5111 Encounter for antineoplastic chemotherapy: Secondary | ICD-10-CM

## 2015-07-17 DIAGNOSIS — C775 Secondary and unspecified malignant neoplasm of intrapelvic lymph nodes: Secondary | ICD-10-CM

## 2015-07-17 DIAGNOSIS — C679 Malignant neoplasm of bladder, unspecified: Secondary | ICD-10-CM | POA: Diagnosis not present

## 2015-07-17 DIAGNOSIS — C675 Malignant neoplasm of bladder neck: Secondary | ICD-10-CM | POA: Diagnosis not present

## 2015-07-17 DIAGNOSIS — C671 Malignant neoplasm of dome of bladder: Secondary | ICD-10-CM | POA: Diagnosis not present

## 2015-07-17 LAB — CBC WITH DIFFERENTIAL (CANCER CENTER ONLY)
BASO#: 0 10*3/uL (ref 0.0–0.2)
BASO%: 0.4 % (ref 0.0–2.0)
EOS ABS: 0 10*3/uL (ref 0.0–0.5)
EOS%: 0.4 % (ref 0.0–7.0)
HCT: 28.6 % — ABNORMAL LOW (ref 38.7–49.9)
HEMOGLOBIN: 9.4 g/dL — AB (ref 13.0–17.1)
LYMPH#: 0.9 10*3/uL (ref 0.9–3.3)
LYMPH%: 38.9 % (ref 14.0–48.0)
MCH: 34.6 pg — AB (ref 28.0–33.4)
MCHC: 32.9 g/dL (ref 32.0–35.9)
MCV: 105 fL — AB (ref 82–98)
MONO#: 0.2 10*3/uL (ref 0.1–0.9)
MONO%: 6.6 % (ref 0.0–13.0)
NEUT%: 53.7 % (ref 40.0–80.0)
NEUTROS ABS: 1.2 10*3/uL — AB (ref 1.5–6.5)
Platelets: 114 10*3/uL — ABNORMAL LOW (ref 145–400)
RBC: 2.72 10*6/uL — ABNORMAL LOW (ref 4.20–5.70)
RDW: 15.3 % (ref 11.1–15.7)
WBC: 2.3 10*3/uL — ABNORMAL LOW (ref 4.0–10.0)

## 2015-07-17 LAB — CMP (CANCER CENTER ONLY)
ALBUMIN: 3.5 g/dL (ref 3.3–5.5)
ALK PHOS: 46 U/L (ref 26–84)
ALT(SGPT): 35 U/L (ref 10–47)
AST: 42 U/L — ABNORMAL HIGH (ref 11–38)
BUN, Bld: 18 mg/dL (ref 7–22)
CALCIUM: 9.7 mg/dL (ref 8.0–10.3)
CHLORIDE: 101 meq/L (ref 98–108)
CO2: 29 mEq/L (ref 18–33)
Creat: 1.3 mg/dl — ABNORMAL HIGH (ref 0.6–1.2)
Glucose, Bld: 98 mg/dL (ref 73–118)
POTASSIUM: 4.3 meq/L (ref 3.3–4.7)
Sodium: 139 mEq/L (ref 128–145)
TOTAL PROTEIN: 8.4 g/dL — AB (ref 6.4–8.1)
Total Bilirubin: 0.6 mg/dl (ref 0.20–1.60)

## 2015-07-17 MED ORDER — PROCHLORPERAZINE MALEATE 10 MG PO TABS
10.0000 mg | ORAL_TABLET | Freq: Once | ORAL | Status: AC
  Administered 2015-07-17: 10 mg via ORAL

## 2015-07-17 MED ORDER — SODIUM CHLORIDE 0.9 % IV SOLN
2000.0000 mg | Freq: Once | INTRAVENOUS | Status: AC
  Administered 2015-07-17: 2000 mg via INTRAVENOUS
  Filled 2015-07-17: qty 52.6

## 2015-07-17 MED ORDER — PROCHLORPERAZINE MALEATE 10 MG PO TABS
ORAL_TABLET | ORAL | Status: AC
  Filled 2015-07-17: qty 1

## 2015-07-17 MED ORDER — SODIUM CHLORIDE 0.9 % IJ SOLN
10.0000 mL | INTRAMUSCULAR | Status: DC | PRN
  Administered 2015-07-17: 10 mL
  Filled 2015-07-17: qty 10

## 2015-07-17 MED ORDER — HEPARIN SOD (PORK) LOCK FLUSH 100 UNIT/ML IV SOLN
500.0000 [IU] | Freq: Once | INTRAVENOUS | Status: AC | PRN
  Administered 2015-07-17: 500 [IU]
  Filled 2015-07-17: qty 5

## 2015-07-17 MED ORDER — SODIUM CHLORIDE 0.9 % IV SOLN
Freq: Once | INTRAVENOUS | Status: AC
  Administered 2015-07-17: 12:00:00 via INTRAVENOUS

## 2015-07-17 NOTE — Patient Instructions (Signed)
[facility] Discharge Instructions for Patients Receiving Chemotherapy  Today you received the following chemotherapy agents Gemzar.  To help prevent nausea and vomiting after your treatment, we encourage you to take your nausea medication.   If you develop nausea and vomiting that is not controlled by your nausea medication, call the clinic.   BELOW ARE SYMPTOMS THAT SHOULD BE REPORTED IMMEDIATELY:  *FEVER GREATER THAN 100.5 F  *CHILLS WITH OR WITHOUT FEVER  NAUSEA AND VOMITING THAT IS NOT CONTROLLED WITH YOUR NAUSEA MEDICATION  *UNUSUAL SHORTNESS OF BREATH  *UNUSUAL BRUISING OR BLEEDING  TENDERNESS IN MOUTH AND THROAT WITH OR WITHOUT PRESENCE OF ULCERS  *URINARY PROBLEMS  *BOWEL PROBLEMS  UNUSUAL RASH Items with * indicate a potential emergency and should be followed up as soon as possible.  Feel free to call the clinic you have any questions or concerns. The clinic phone number is [phone].  Please show the CHEMO ALERT CARD at check-in to the Emergency Department and triage nurse.   

## 2015-07-23 ENCOUNTER — Other Ambulatory Visit: Payer: Self-pay | Admitting: Internal Medicine

## 2015-07-31 ENCOUNTER — Ambulatory Visit (HOSPITAL_BASED_OUTPATIENT_CLINIC_OR_DEPARTMENT_OTHER): Payer: Medicare Other | Admitting: Family

## 2015-07-31 ENCOUNTER — Ambulatory Visit (HOSPITAL_BASED_OUTPATIENT_CLINIC_OR_DEPARTMENT_OTHER): Payer: Medicare Other

## 2015-07-31 VITALS — BP 126/73 | HR 75 | Temp 97.6°F | Resp 20 | Wt 189.1 lb

## 2015-07-31 DIAGNOSIS — C679 Malignant neoplasm of bladder, unspecified: Secondary | ICD-10-CM

## 2015-07-31 DIAGNOSIS — C775 Secondary and unspecified malignant neoplasm of intrapelvic lymph nodes: Secondary | ICD-10-CM

## 2015-07-31 DIAGNOSIS — Z5111 Encounter for antineoplastic chemotherapy: Secondary | ICD-10-CM

## 2015-07-31 DIAGNOSIS — C7951 Secondary malignant neoplasm of bone: Secondary | ICD-10-CM

## 2015-07-31 LAB — CBC WITH DIFFERENTIAL (CANCER CENTER ONLY)
BASO#: 0 10*3/uL (ref 0.0–0.2)
BASO%: 0.7 % (ref 0.0–2.0)
EOS ABS: 0 10*3/uL (ref 0.0–0.5)
EOS%: 0.7 % (ref 0.0–7.0)
HEMATOCRIT: 27.9 % — AB (ref 38.7–49.9)
HEMOGLOBIN: 9.1 g/dL — AB (ref 13.0–17.1)
LYMPH#: 1.1 10*3/uL (ref 0.9–3.3)
LYMPH%: 37.5 % (ref 14.0–48.0)
MCH: 34.7 pg — ABNORMAL HIGH (ref 28.0–33.4)
MCHC: 32.6 g/dL (ref 32.0–35.9)
MCV: 107 fL — ABNORMAL HIGH (ref 82–98)
MONO#: 0.6 10*3/uL (ref 0.1–0.9)
MONO%: 21.1 % — AB (ref 0.0–13.0)
NEUT#: 1.2 10*3/uL — ABNORMAL LOW (ref 1.5–6.5)
NEUT%: 40 % (ref 40.0–80.0)
Platelets: 83 10*3/uL — ABNORMAL LOW (ref 145–400)
RBC: 2.62 10*6/uL — ABNORMAL LOW (ref 4.20–5.70)
RDW: 16.3 % — ABNORMAL HIGH (ref 11.1–15.7)
WBC: 3 10*3/uL — AB (ref 4.0–10.0)

## 2015-07-31 LAB — BASIC METABOLIC PANEL - CANCER CENTER ONLY
BUN: 17 mg/dL (ref 7–22)
CHLORIDE: 108 meq/L (ref 98–108)
CO2: 25 meq/L (ref 18–33)
Calcium: 9.1 mg/dL (ref 8.0–10.3)
Creat: 1.1 mg/dl (ref 0.6–1.2)
GLUCOSE: 92 mg/dL (ref 73–118)
POTASSIUM: 4 meq/L (ref 3.3–4.7)
Sodium: 138 mEq/L (ref 128–145)

## 2015-07-31 MED ORDER — CARBOPLATIN CHEMO INTRADERMAL TEST DOSE 100MCG/0.02ML
100.0000 ug | Freq: Once | INTRADERMAL | Status: AC
  Administered 2015-07-31: 100 ug via INTRADERMAL
  Filled 2015-07-31: qty 0.02

## 2015-07-31 MED ORDER — ZOLEDRONIC ACID 4 MG/100ML IV SOLN
4.0000 mg | Freq: Once | INTRAVENOUS | Status: AC
  Administered 2015-07-31: 4 mg via INTRAVENOUS
  Filled 2015-07-31: qty 100

## 2015-07-31 MED ORDER — SODIUM CHLORIDE 0.9 % IV SOLN
Freq: Once | INTRAVENOUS | Status: AC
  Administered 2015-07-31: 14:00:00 via INTRAVENOUS
  Filled 2015-07-31: qty 8

## 2015-07-31 MED ORDER — SODIUM CHLORIDE 0.9 % IV SOLN
2000.0000 mg | Freq: Once | INTRAVENOUS | Status: AC
  Administered 2015-07-31: 2000 mg via INTRAVENOUS
  Filled 2015-07-31: qty 52.6

## 2015-07-31 MED ORDER — SODIUM CHLORIDE 0.9 % IV SOLN
Freq: Once | INTRAVENOUS | Status: AC
  Administered 2015-07-31: 12:00:00 via INTRAVENOUS

## 2015-07-31 MED ORDER — SODIUM CHLORIDE 0.9 % IJ SOLN
10.0000 mL | INTRAMUSCULAR | Status: DC | PRN
Start: 2015-07-31 — End: 2015-07-31
  Administered 2015-07-31: 10 mL
  Filled 2015-07-31: qty 10

## 2015-07-31 MED ORDER — SODIUM CHLORIDE 0.9 % IV SOLN
500.0000 mg | Freq: Once | INTRAVENOUS | Status: AC
  Administered 2015-07-31: 500 mg via INTRAVENOUS
  Filled 2015-07-31: qty 50

## 2015-07-31 MED ORDER — HEPARIN SOD (PORK) LOCK FLUSH 100 UNIT/ML IV SOLN
500.0000 [IU] | Freq: Once | INTRAVENOUS | Status: AC | PRN
  Administered 2015-07-31: 500 [IU]
  Filled 2015-07-31: qty 5

## 2015-07-31 NOTE — Patient Instructions (Signed)
[facility] Discharge Instructions for Patients Receiving Chemotherapy  Today you received the following chemotherapy agents Carboplatin and Gemzar.  To help prevent nausea and vomiting after your treatment, we encourage you to take your nausea medication.   If you develop nausea and vomiting that is not controlled by your nausea medication, call the clinic.   BELOW ARE SYMPTOMS THAT SHOULD BE REPORTED IMMEDIATELY:  *FEVER GREATER THAN 100.5 F  *CHILLS WITH OR WITHOUT FEVER  NAUSEA AND VOMITING THAT IS NOT CONTROLLED WITH YOUR NAUSEA MEDICATION  *UNUSUAL SHORTNESS OF BREATH  *UNUSUAL BRUISING OR BLEEDING  TENDERNESS IN MOUTH AND THROAT WITH OR WITHOUT PRESENCE OF ULCERS  *URINARY PROBLEMS  *BOWEL PROBLEMS  UNUSUAL RASH Items with * indicate a potential emergency and should be followed up as soon as possible.  Feel free to call the clinic you have any questions or concerns. The clinic phone number is [phone].  Please show the [facility] at check-in to the Emergency Department and triage nurse.

## 2015-07-31 NOTE — Progress Notes (Signed)
Carbo test dose given at 1305, site assessed at 1310, 1320, and 1335-no reaction noted.

## 2015-07-31 NOTE — Progress Notes (Signed)
Hematology and Oncology Follow Up Visit  Dustin Jordan [id number] [birth year] 69 y.o. 07/31/2015   Principle Diagnosis:  Metastatic bladder cancer  Current Therapy:   Carboplatin/Gemzar s/p cycle 8 Zometa 4 mg IV every 3 weeks    Interim History:  Dustin Jordan is here today for a follow-up and cycle 9 of chemo. He is feeling good and denies having any complaints at this time.  His Pet scan several weeks ago showed an interval response to therapy. He had decrease in size and hypermetabolism involving his periumbilical implant and bilateral pelvic lymph nodes. His sacral metastasis is no longer hypermetabolic.  He denies fever, chills, n/v, cough, rash, dizziness, chest pain, palpitations, abdominal pain or changes in his bowel or bladder habits. Still some mild SOB at times of exertion which is unchanged from his "norm." No swelling, tenderness, numbness or tingling in his extremities. No c/o joint aches or "bone" pain.   He has a good appetite and is staying hydrated. His weight is up 3 lbs since his last visit.   Medications:    Medication List       This list is accurate as of: 07/31/15 11:46 AM.  Always use your most recent med list.               dexamethasone 4 MG tablet  Commonly known as:  DECADRON  Take 2 tablets (8 mg total) by mouth 2 (two) times daily with a meal. Start the day after chemotherapy for 3 days.     hydrochlorothiazide 12.5 MG capsule  Commonly known as:  MICROZIDE  TAKE 1 CAPSULE BY MOUTH EVERY MORNING  "OFFICE VISIT NEEDED FOR REFILLS"     lidocaine-prilocaine cream  Commonly known as:  EMLA  Apply 1 application topically as needed.     ondansetron 8 MG tablet  Commonly known as:  ZOFRAN  Take 1 tablet (8 mg total) by mouth 2 (two) times daily. Start the day after chemo for 3 days. Then take as needed for nausea or vomiting.     prochlorperazine 10 MG tablet  Commonly known as:  COMPAZINE  Take 1 tablet (10 mg total) by mouth every 6 (six)  hours as needed (Nausea or vomiting).     tamsulosin 0.4 MG Caps capsule  Commonly known as:  FLOMAX  Take 2 capsules (0.8 mg total) by mouth daily.     VISINE OP  Apply 1-2 drops to eye daily as needed (redness.).        Allergies: No Known Allergies  Past Medical History, Surgical history, Social history, and Family History were reviewed and updated.  Review of Systems: All other 10 point review of systems is negative.   Physical Exam:  weight is 189 lb 1.3 oz (85.766 kg). His oral temperature is 97.6 F (36.4 C). His blood pressure is 126/73 and his pulse is 75. His respiration is 20.   Wt Readings from Last 3 Encounters:  07/31/15 189 lb 1.3 oz (85.766 kg)  07/10/15 186 lb (84.369 kg)  06/19/15 189 lb (85.73 kg)    Ocular: Sclerae unicteric, pupils equal, round and reactive to light Ear-nose-throat: Oropharynx clear, dentition fair Lymphatic: No cervical or supraclavicular adenopathy Lungs no rales or rhonchi, good excursion bilaterally Heart regular rate and rhythm, no murmur appreciated Abd soft, nontender, positive bowel sounds MSK no focal spinal tenderness, no joint edema Neuro: non-focal, well-oriented, appropriate affect Breasts: Deferred  Lab Results  Component Value Date   WBC 3.0* 07/31/2015   HGB  9.1* 07/31/2015   HCT 27.9* 07/31/2015   MCV 107* 07/31/2015   PLT 83* 07/31/2015   No results found for: FERRITIN, IRON, TIBC, UIBC, IRONPCTSAT Lab Results  Component Value Date   RBC 2.62* 07/31/2015   No results found for: KPAFRELGTCHN, LAMBDASER, KAPLAMBRATIO No results found for: IGGSERUM, IGA, IGMSERUM No results found for: Odetta Pink, SPEI   Chemistry      Component Value Date/Time   NA 139 07/17/2015 1137   NA 138 01/19/2015 1210   K 4.3 07/17/2015 1137   K 3.9 01/19/2015 1210   CL 101 07/17/2015 1137   CL 102 01/19/2015 1210   CO2 29 07/17/2015 1137   CO2 27 01/19/2015 1210   BUN  18 07/17/2015 1137   BUN 14 01/19/2015 1210   CREATININE 1.3* 07/17/2015 1137   CREATININE 1.14 01/19/2015 1210      Component Value Date/Time   CALCIUM 9.7 07/17/2015 1137   CALCIUM 9.8 01/19/2015 1210   ALKPHOS 46 07/17/2015 1137   ALKPHOS 81 09/18/2014 1433   AST 42* 07/17/2015 1137   AST 26 09/18/2014 1433   ALT 35 07/17/2015 1137   ALT 21 09/18/2014 1433   BILITOT 0.60 07/17/2015 1137   BILITOT 0.4 09/18/2014 1433     Impression and Plan: Dustin Jordan is very pleasant 69 yo AfricanAmerican male with poorly differentiated metastatic bladder cancer. His PET scan showed an interval response to therapy. He is asymptomatic at this time.  His Hgb is staying at 9.1 with an MCV of 107. His platelet count is 83. No episodes of bruising or bleeding.  We will proceed with cycle 9 of treatment today as planned.  He has his current appointment and treatment schedule.  He will contact us with any questions or concerns. We can certainly see him sooner if need be.   Dustin Bottom, NP 11/1/201611:46 AM

## 2015-08-06 ENCOUNTER — Ambulatory Visit (INDEPENDENT_AMBULATORY_CARE_PROVIDER_SITE_OTHER): Payer: Medicare Other | Admitting: Family Medicine

## 2015-08-06 VITALS — BP 138/72 | HR 70 | Temp 97.8°F | Resp 16 | Ht 74.0 in | Wt 190.4 lb

## 2015-08-06 DIAGNOSIS — Z1211 Encounter for screening for malignant neoplasm of colon: Secondary | ICD-10-CM | POA: Diagnosis not present

## 2015-08-06 DIAGNOSIS — D61810 Antineoplastic chemotherapy induced pancytopenia: Secondary | ICD-10-CM

## 2015-08-06 DIAGNOSIS — T451X5A Adverse effect of antineoplastic and immunosuppressive drugs, initial encounter: Secondary | ICD-10-CM

## 2015-08-06 NOTE — Patient Instructions (Signed)
I will set up an appt for you to see Dr. [name]

## 2015-08-06 NOTE — Progress Notes (Signed)
[facility] [address] [place] [address] [phone]  Date:  08/06/2015   Name:  Dustin Jordan   DOB:  [birth year]   MRN:  [id number]  PCP:  Leandrew Koyanagi, MD    Chief Complaint: Other   History of Present Illness:  Dustin Jordan is a 69 y.o. very pleasant male patient who presents with the following:  He needs to have a colonoscopy set up- he has seen Dr. [name] in the past- it has been about 10 years or so, he is not quite sure.  His last colonoscopy showed some non- cancerous polyps.  He has not had any blood in his stool.   Monday or Wednesday are better for the colsultation  He is on chemo for his bladder cancer- this is why his blood counts are low.   He declines a flu shot today- he has never gotten one and does not want to start now. Discussed the fact that this may be more important since he is on chemo but he declines    Patient Active Problem List   Diagnosis Date Noted  . 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 Lymphadenopathy, pelvic 01/20/2015  . Partial small bowel obstruction ([place]) 01/19/2015  . Thyroid mass 10/04/2014  . Clot retention of urine 01/01/2014  . HTN (hypertension) 04/20/2013  . Hematuria 04/20/2013    Past Medical History  Diagnosis Date  . Hypertension   . White coat hypertension   . Swelling of right knee joint     s/p arthroscopy 06-08-2013  . Bladder cancer ([place])   . Hematuria   . Frequency of urination   . 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 Knee arthroscopy Right 06-08-2013  . Cystoscopy w/ retrogrades Bilateral 06/22/2013    Procedure: CYSTOSCOPY WITH RETROGRADE PYELOGRAM;  Surgeon: Alexis Frock, MD;  Location: [facility];  Service: Urology;  Laterality: Bilateral;  . Transurethral resection of bladder tumor with gyrus (turbt-gyrus) N/A 06/22/2013     Procedure: TRANSURETHRAL RESECTION OF BLADDER TUMOR WITH GYRUS (TURBT-GYRUS);  Surgeon: Alexis Frock, MD;  Location: [facility];  Service: Urology;  Laterality: N/A;  . Cystoscopy with retrograde pyelogram, ureteroscopy and stent placement Bilateral 01/02/2014    Procedure: CYSTOSCOPY WITH RETROGRADE PYELOGRAM BILATERAL URETERS  AND TRANSURETHRAL RESECTION OF A BLADDER TUMOR WITH MULTIPLE BX;  Surgeon: Alexis Frock, MD;  Location: [facility];  Service: Urology;  Laterality: Bilateral;  . Robot assisted laparoscopic complete cystect ileal conduit N/A 02/17/2014    Procedure: ROBOTIC ASSISTED LAPAROSCOPIC PARTIAL CYSTECTOMY;  Surgeon: Alexis Frock, MD;  Location: [facility];  Service: Urology;  Laterality: N/A;  . Lymphadenectomy Bilateral 02/17/2014    Procedure: BILATERAL LYMPH NODE DISSECTION;  Surgeon: Alexis Frock, MD;  Location: [facility];  Service: Urology;  Laterality: Bilateral;  . Cystoscopy N/A 02/17/2014    Procedure: CYSTOSCOPY WITH INDOCYANINE GREEN DYE;  Surgeon: Alexis Frock, MD;  Location: [facility];  Service: Urology;  Laterality: N/A;    Social History  Substance Use Topics  . Smoking status: Former Smoker -- 1.00 packs/day for 36 years    Types: Cigarettes    Start date: 03/06/1983    Quit date: 06/15/2010  . Smokeless tobacco: Never Used     Comment: quit 5 years ago  . Alcohol Use: No    Family History  Problem Relation Age of Onset  .  Thyroid disease Neg Hx     No Known Allergies  Medication list has been reviewed and updated.  Current Outpatient Prescriptions on File Prior to Visit  Medication Sig Dispense Refill  . hydrochlorothiazide (MICROZIDE) 12.5 MG capsule TAKE 1 CAPSULE BY MOUTH EVERY MORNING  "OFFICE VISIT NEEDED FOR REFILLS" 90 capsule 0  . lidocaine-prilocaine (EMLA) cream Apply 1 application topically as needed. 30 g 12  . tamsulosin (FLOMAX) 0.4 MG CAPS capsule Take 2 capsules (0.8 mg total) by mouth daily. 60 capsule 2  . dexamethasone  (DECADRON) 4 MG tablet Take 2 tablets (8 mg total) by mouth 2 (two) times daily with a meal. Start the day after chemotherapy for 3 days. (Patient not taking: Reported on 07/31/2015) 30 tablet 1  . ondansetron (ZOFRAN) 8 MG tablet Take 1 tablet (8 mg total) by mouth 2 (two) times daily. Start the day after chemo for 3 days. Then take as needed for nausea or vomiting. (Patient not taking: Reported on 07/31/2015) 30 tablet 1  . prochlorperazine (COMPAZINE) 10 MG tablet Take 1 tablet (10 mg total) by mouth every 6 (six) hours as needed (Nausea or vomiting). (Patient not taking: Reported on 07/31/2015) 30 tablet 1  . Tetrahydrozoline HCl (VISINE OP) Apply 1-2 drops to eye daily as needed (redness.).     No current facility-administered medications on file prior to visit.    Review of Systems:  As per HPI- otherwise negative.   Physical Examination: Filed Vitals:   08/06/15 0835  BP: 138/72  Pulse: 70  Temp: 97.8 F (36.6 C)  Resp: 16   Filed Vitals:   08/06/15 0835  Height: 6\' 2"  (1.88 m)  Weight: 190 lb 6.4 oz (86.365 kg)   Body mass index is 24.44 kg/(m^2). Ideal Body Weight: Weight in (lb) to have BMI = 25: 194.3  GEN: WDWN, NAD, Non-toxic, A & O x 3, looks well HEENT: Atraumatic, Normocephalic. Neck supple. No masses, No LAD. Ears and Nose: No external deformity. CV: RRR, No M/G/R. No JVD. No thrill. No extra heart sounds. PULM: CTA B, no wheezes, crackles, rhonchi. No retractions. No resp. distress. No accessory muscle use. ABD: S, NT, ND. No rebound. No HSM. EXTR: No c/c/e NEURO Normal gait.  PSYCH: Normally interactive. Conversant. Not depressed or anxious appearing.  Calm demeanor.    Assessment and Plan: Screening for colon cancer - Plan: Ambulatory referral to Gastroenterology  Antineoplastic chemotherapy induced pancytopenia [facility])  Placed referral to Dr. [name] for him Declines flu shot today He will continue to follow-up for his bladder cancer treatment    Signed Lamar Blinks, MD

## 2015-08-07 ENCOUNTER — Other Ambulatory Visit (HOSPITAL_BASED_OUTPATIENT_CLINIC_OR_DEPARTMENT_OTHER): Payer: Medicare Other

## 2015-08-07 ENCOUNTER — Ambulatory Visit (HOSPITAL_BASED_OUTPATIENT_CLINIC_OR_DEPARTMENT_OTHER): Payer: Medicare Other

## 2015-08-07 VITALS — BP 151/73 | HR 76 | Temp 97.5°F | Resp 16

## 2015-08-07 DIAGNOSIS — C679 Malignant neoplasm of bladder, unspecified: Secondary | ICD-10-CM

## 2015-08-07 DIAGNOSIS — C775 Secondary and unspecified malignant neoplasm of intrapelvic lymph nodes: Secondary | ICD-10-CM

## 2015-08-07 DIAGNOSIS — Z5111 Encounter for antineoplastic chemotherapy: Secondary | ICD-10-CM | POA: Diagnosis not present

## 2015-08-07 LAB — CBC WITH DIFFERENTIAL (CANCER CENTER ONLY)
BASO#: 0 10*3/uL (ref 0.0–0.2)
BASO%: 1.3 % (ref 0.0–2.0)
EOS ABS: 0 10*3/uL (ref 0.0–0.5)
EOS%: 0.4 % (ref 0.0–7.0)
HEMATOCRIT: 27.8 % — AB (ref 38.7–49.9)
HEMOGLOBIN: 9 g/dL — AB (ref 13.0–17.1)
LYMPH#: 1 10*3/uL (ref 0.9–3.3)
LYMPH%: 42.9 % (ref 14.0–48.0)
MCH: 33.8 pg — ABNORMAL HIGH (ref 28.0–33.4)
MCHC: 32.4 g/dL (ref 32.0–35.9)
MCV: 105 fL — AB (ref 82–98)
MONO#: 0.2 10*3/uL (ref 0.1–0.9)
MONO%: 7.7 % (ref 0.0–13.0)
NEUT%: 47.7 % (ref 40.0–80.0)
NEUTROS ABS: 1.1 10*3/uL — AB (ref 1.5–6.5)
Platelets: 118 10*3/uL — ABNORMAL LOW (ref 145–400)
RBC: 2.66 10*6/uL — ABNORMAL LOW (ref 4.20–5.70)
RDW: 15 % (ref 11.1–15.7)
WBC: 2.3 10*3/uL — ABNORMAL LOW (ref 4.0–10.0)

## 2015-08-07 LAB — BASIC METABOLIC PANEL - CANCER CENTER ONLY
BUN: 16 mg/dL (ref 7–22)
CHLORIDE: 107 meq/L (ref 98–108)
CO2: 24 meq/L (ref 18–33)
Calcium: 9 mg/dL (ref 8.0–10.3)
Creat: 1.1 mg/dl (ref 0.6–1.2)
GLUCOSE: 89 mg/dL (ref 73–118)
POTASSIUM: 4.2 meq/L (ref 3.3–4.7)
SODIUM: 144 meq/L (ref 128–145)

## 2015-08-07 MED ORDER — SODIUM CHLORIDE 0.9 % IV SOLN
2000.0000 mg | Freq: Once | INTRAVENOUS | Status: AC
  Administered 2015-08-07: 2000 mg via INTRAVENOUS
  Filled 2015-08-07: qty 52.6

## 2015-08-07 MED ORDER — SODIUM CHLORIDE 0.9 % IJ SOLN
10.0000 mL | INTRAMUSCULAR | Status: DC | PRN
  Administered 2015-08-07: 10 mL
  Filled 2015-08-07: qty 10

## 2015-08-07 MED ORDER — SODIUM CHLORIDE 0.9 % IV SOLN
Freq: Once | INTRAVENOUS | Status: AC
  Administered 2015-08-07: 11:00:00 via INTRAVENOUS

## 2015-08-07 MED ORDER — PROCHLORPERAZINE MALEATE 10 MG PO TABS
ORAL_TABLET | ORAL | Status: AC
  Filled 2015-08-07: qty 1

## 2015-08-07 MED ORDER — HEPARIN SOD (PORK) LOCK FLUSH 100 UNIT/ML IV SOLN
500.0000 [IU] | Freq: Once | INTRAVENOUS | Status: AC | PRN
  Administered 2015-08-07: 500 [IU]
  Filled 2015-08-07: qty 5

## 2015-08-07 MED ORDER — PROCHLORPERAZINE MALEATE 10 MG PO TABS
10.0000 mg | ORAL_TABLET | Freq: Once | ORAL | Status: AC
  Administered 2015-08-07: 10 mg via ORAL

## 2015-08-07 NOTE — Patient Instructions (Signed)
[facility] Discharge Instructions for Patients Receiving Chemotherapy  Today you received the following chemotherapy agents Gemzar.  To help prevent nausea and vomiting after your treatment, we encourage you to take your nausea medication.   If you develop nausea and vomiting that is not controlled by your nausea medication, call the clinic.   BELOW ARE SYMPTOMS THAT SHOULD BE REPORTED IMMEDIATELY:  *FEVER GREATER THAN 100.5 F  *CHILLS WITH OR WITHOUT FEVER  NAUSEA AND VOMITING THAT IS NOT CONTROLLED WITH YOUR NAUSEA MEDICATION  *UNUSUAL SHORTNESS OF BREATH  *UNUSUAL BRUISING OR BLEEDING  TENDERNESS IN MOUTH AND THROAT WITH OR WITHOUT PRESENCE OF ULCERS  *URINARY PROBLEMS  *BOWEL PROBLEMS  UNUSUAL RASH Items with * indicate a potential emergency and should be followed up as soon as possible.  Feel free to call the clinic you have any questions or concerns. The clinic phone number is [phone].  Please show the CHEMO ALERT CARD at check-in to the Emergency Department and triage nurse.   

## 2015-08-07 NOTE — Progress Notes (Signed)
Ok to treat per Dr. Ennever 

## 2015-08-21 ENCOUNTER — Ambulatory Visit (HOSPITAL_BASED_OUTPATIENT_CLINIC_OR_DEPARTMENT_OTHER): Payer: Medicare Other | Admitting: Family

## 2015-08-21 ENCOUNTER — Encounter: Payer: Self-pay | Admitting: Family

## 2015-08-21 ENCOUNTER — Other Ambulatory Visit (HOSPITAL_BASED_OUTPATIENT_CLINIC_OR_DEPARTMENT_OTHER): Payer: Medicare Other

## 2015-08-21 ENCOUNTER — Ambulatory Visit (HOSPITAL_BASED_OUTPATIENT_CLINIC_OR_DEPARTMENT_OTHER): Payer: Medicare Other

## 2015-08-21 VITALS — BP 136/80 | HR 75 | Temp 97.5°F | Resp 16 | Ht 74.0 in | Wt 190.0 lb

## 2015-08-21 DIAGNOSIS — C775 Secondary and unspecified malignant neoplasm of intrapelvic lymph nodes: Secondary | ICD-10-CM | POA: Diagnosis not present

## 2015-08-21 DIAGNOSIS — C7951 Secondary malignant neoplasm of bone: Secondary | ICD-10-CM

## 2015-08-21 DIAGNOSIS — C679 Malignant neoplasm of bladder, unspecified: Secondary | ICD-10-CM

## 2015-08-21 DIAGNOSIS — Z5111 Encounter for antineoplastic chemotherapy: Secondary | ICD-10-CM | POA: Diagnosis not present

## 2015-08-21 LAB — CMP (CANCER CENTER ONLY)
ALT(SGPT): 18 U/L (ref 10–47)
AST: 31 U/L (ref 11–38)
Albumin: 3.6 g/dL (ref 3.3–5.5)
Alkaline Phosphatase: 48 U/L (ref 26–84)
BILIRUBIN TOTAL: 0.7 mg/dL (ref 0.20–1.60)
BUN: 17 mg/dL (ref 7–22)
CALCIUM: 10 mg/dL (ref 8.0–10.3)
CO2: 29 meq/L (ref 18–33)
Chloride: 108 mEq/L (ref 98–108)
Creat: 1 mg/dl (ref 0.6–1.2)
GLUCOSE: 95 mg/dL (ref 73–118)
Potassium: 3.8 mEq/L (ref 3.3–4.7)
SODIUM: 141 meq/L (ref 128–145)
Total Protein: 9.1 g/dL — ABNORMAL HIGH (ref 6.4–8.1)

## 2015-08-21 LAB — CBC WITH DIFFERENTIAL (CANCER CENTER ONLY)
BASO#: 0 10*3/uL (ref 0.0–0.2)
BASO%: 0 % (ref 0.0–2.0)
EOS%: 0.9 % (ref 0.0–7.0)
Eosinophils Absolute: 0 10*3/uL (ref 0.0–0.5)
HEMATOCRIT: 30.8 % — AB (ref 38.7–49.9)
HEMOGLOBIN: 9.9 g/dL — AB (ref 13.0–17.1)
LYMPH#: 1.1 10*3/uL (ref 0.9–3.3)
LYMPH%: 31.2 % (ref 14.0–48.0)
MCH: 33.7 pg — ABNORMAL HIGH (ref 28.0–33.4)
MCHC: 32.1 g/dL (ref 32.0–35.9)
MCV: 105 fL — AB (ref 82–98)
MONO#: 0.7 10*3/uL (ref 0.1–0.9)
MONO%: 21.8 % — ABNORMAL HIGH (ref 0.0–13.0)
NEUT%: 46.1 % (ref 40.0–80.0)
NEUTROS ABS: 1.6 10*3/uL (ref 1.5–6.5)
Platelets: 97 10*3/uL — ABNORMAL LOW (ref 145–400)
RBC: 2.94 10*6/uL — AB (ref 4.20–5.70)
RDW: 16.5 % — ABNORMAL HIGH (ref 11.1–15.7)
WBC: 3.4 10*3/uL — AB (ref 4.0–10.0)

## 2015-08-21 LAB — LACTATE DEHYDROGENASE (CC13): LDH: 247 U/L — AB (ref 125–245)

## 2015-08-21 LAB — TECHNOLOGIST REVIEW CHCC SATELLITE

## 2015-08-21 MED ORDER — ZOLEDRONIC ACID 4 MG/100ML IV SOLN
4.0000 mg | Freq: Once | INTRAVENOUS | Status: AC
  Administered 2015-08-21: 4 mg via INTRAVENOUS
  Filled 2015-08-21: qty 100

## 2015-08-21 MED ORDER — SODIUM CHLORIDE 0.9 % IV SOLN
Freq: Once | INTRAVENOUS | Status: AC
  Administered 2015-08-21: 11:00:00 via INTRAVENOUS
  Filled 2015-08-21: qty 8

## 2015-08-21 MED ORDER — SODIUM CHLORIDE 0.9 % IV SOLN
2000.0000 mg | Freq: Once | INTRAVENOUS | Status: AC
  Administered 2015-08-21: 2000 mg via INTRAVENOUS
  Filled 2015-08-21: qty 52.63

## 2015-08-21 MED ORDER — CARBOPLATIN CHEMO INTRADERMAL TEST DOSE 100MCG/0.02ML
100.0000 ug | Freq: Once | INTRADERMAL | Status: AC
  Administered 2015-08-21: 100 ug via INTRADERMAL
  Filled 2015-08-21: qty 0.01

## 2015-08-21 MED ORDER — SODIUM CHLORIDE 0.9 % IV SOLN
Freq: Once | INTRAVENOUS | Status: AC
  Administered 2015-08-21: 10:00:00 via INTRAVENOUS

## 2015-08-21 MED ORDER — HEPARIN SOD (PORK) LOCK FLUSH 100 UNIT/ML IV SOLN
500.0000 [IU] | Freq: Once | INTRAVENOUS | Status: AC | PRN
  Administered 2015-08-21: 500 [IU]
  Filled 2015-08-21: qty 5

## 2015-08-21 MED ORDER — SODIUM CHLORIDE 0.9 % IJ SOLN
10.0000 mL | INTRAMUSCULAR | Status: DC | PRN
  Administered 2015-08-21: 10 mL
  Filled 2015-08-21: qty 10

## 2015-08-21 MED ORDER — SODIUM CHLORIDE 0.9 % IV SOLN
500.0000 mg | Freq: Once | INTRAVENOUS | Status: AC
  Administered 2015-08-21: 500 mg via INTRAVENOUS
  Filled 2015-08-21: qty 50

## 2015-08-21 NOTE — Progress Notes (Signed)
Hematology and Oncology Follow Up Visit  Dustin Jordan [id number] 07-05-1946 69 y.o. 08/21/2015   Principle Diagnosis:  Metastatic bladder cancer  Current Therapy:   Carboplatin/Gemzar s/p cycle 9 Zometa 4 mg IV every 3 weeks    Interim History:  Dustin Jordan is here today for a follow-up and cycle 10 of chemo. He is doing well and has had no problems since his last visit.  He has had a nice response to treatment and will be due again for scans in 3 weeks.  He denies fever, chills, n/v, cough, rash, dizziness, chest pain, palpitations, abdominal pain or changes in his bowel or bladder habits.  He has some mild SOB at times with exertion. He will take a moment to rest and this resolves.  No lymphadenopathy found on assessment.  He has had no episodes of bleeding or bruising.  No swelling, tenderness, numbness or tingling in his extremities. He has some occasional lower back pain that come and goes.  He has a good appetite and is staying hydrated. His weight is unchanged.   Medications:    Medication List       This list is accurate as of: 08/21/15  8:49 AM.  Always use your most recent med list.               dexamethasone 4 MG tablet  Commonly known as:  DECADRON  Take 2 tablets (8 mg total) by mouth 2 (two) times daily with a meal. Start the day after chemotherapy for 3 days.     hydrochlorothiazide 12.5 MG capsule  Commonly known as:  MICROZIDE  TAKE 1 CAPSULE BY MOUTH EVERY MORNING  "OFFICE VISIT NEEDED FOR REFILLS"     lidocaine-prilocaine cream  Commonly known as:  EMLA  Apply 1 application topically as needed.     ondansetron 8 MG tablet  Commonly known as:  ZOFRAN  Take 1 tablet (8 mg total) by mouth 2 (two) times daily. Start the day after chemo for 3 days. Then take as needed for nausea or vomiting.     prochlorperazine 10 MG tablet  Commonly known as:  COMPAZINE  Take 1 tablet (10 mg total) by mouth every 6 (six) hours as needed (Nausea or vomiting).       tamsulosin 0.4 MG Caps capsule  Commonly known as:  FLOMAX  Take 2 capsules (0.8 mg total) by mouth daily.     VISINE OP  Apply 1-2 drops to eye daily as needed (redness.).        Allergies: No Known Allergies  Past Medical History, Surgical history, Social history, and Family History were reviewed and updated.  Review of Systems: All other 10 point review of systems is negative.   Physical Exam:  height is 6\' 2"  (1.88 m) and weight is 190 lb (86.183 kg). His oral temperature is 97.5 F (36.4 C). His blood pressure is 136/80 and his pulse is 75. His respiration is 16.   Wt Readings from Last 3 Encounters:  08/21/15 190 lb (86.183 kg)  08/06/15 190 lb 6.4 oz (86.365 kg)  07/31/15 189 lb 1.3 oz (85.766 kg)    Ocular: Sclerae unicteric, pupils equal, round and reactive to light Ear-nose-throat: Oropharynx clear, dentition fair Lymphatic: No cervical supraclavicular or axillary adenopathy Lungs no rales or rhonchi, good excursion bilaterally Heart regular rate and rhythm, no murmur appreciated Abd soft, nontender, positive bowel sounds MSK no focal spinal tenderness, no joint edema Neuro: non-focal, well-oriented, appropriate affect Breasts: Deferred  Lab Results  Component Value Date   WBC 2.3* 08/07/2015   HGB 9.0* 08/07/2015   HCT 27.8* 08/07/2015   MCV 105* 08/07/2015   PLT 118* 08/07/2015   No results found for: FERRITIN, IRON, TIBC, UIBC, IRONPCTSAT Lab Results  Component Value Date   RBC 2.66* 08/07/2015   No results found for: KPAFRELGTCHN, LAMBDASER, KAPLAMBRATIO No results found for: IGGSERUM, IGA, IGMSERUM No results found for: Odetta Pink, SPEI   Chemistry      Component Value Date/Time   NA 144 08/07/2015 1053   NA 138 01/19/2015 1210   K 4.2 08/07/2015 1053   K 3.9 01/19/2015 1210   CL 107 08/07/2015 1053   CL 102 01/19/2015 1210   CO2 24 08/07/2015 1053   CO2 27 01/19/2015 1210    BUN 16 08/07/2015 1053   BUN 14 01/19/2015 1210   CREATININE 1.1 08/07/2015 1053   CREATININE 1.14 01/19/2015 1210      Component Value Date/Time   CALCIUM 9.0 08/07/2015 1053   CALCIUM 9.8 01/19/2015 1210   ALKPHOS 46 07/17/2015 1137   ALKPHOS 81 09/18/2014 1433   AST 42* 07/17/2015 1137   AST 26 09/18/2014 1433   ALT 35 07/17/2015 1137   ALT 21 09/18/2014 1433   BILITOT 0.60 07/17/2015 1137   BILITOT 0.4 09/18/2014 1433     Impression and Plan: Dustin Jordan is very pleasant 69 yo African American male with poorly differentiated metastatic bladder cancer. He has done well with treatments and has had a nice response to therapy.  His Hgb is staying at 9.9 with an MCV of 105. His platelet count is 97.  We will proceed with cycle 10 of treatment today as planned.  We will plan to repeat a PET scan in 3 weeks and then see him in 4 weeks for labs and follow-up.  He will contact us with any questions or concerns. We can certainly see him sooner if need be.   Eliezer Bottom, NP 11/22/20168:49 AM

## 2015-08-21 NOTE — Patient Instructions (Signed)

## 2015-08-27 ENCOUNTER — Encounter: Payer: Self-pay | Admitting: Internal Medicine

## 2015-08-28 ENCOUNTER — Ambulatory Visit (HOSPITAL_BASED_OUTPATIENT_CLINIC_OR_DEPARTMENT_OTHER): Payer: Medicare Other

## 2015-08-28 ENCOUNTER — Other Ambulatory Visit (HOSPITAL_BASED_OUTPATIENT_CLINIC_OR_DEPARTMENT_OTHER): Payer: Medicare Other

## 2015-08-28 VITALS — BP 147/89 | HR 61 | Temp 97.8°F | Resp 18

## 2015-08-28 DIAGNOSIS — C679 Malignant neoplasm of bladder, unspecified: Secondary | ICD-10-CM | POA: Diagnosis not present

## 2015-08-28 DIAGNOSIS — C775 Secondary and unspecified malignant neoplasm of intrapelvic lymph nodes: Secondary | ICD-10-CM

## 2015-08-28 LAB — CBC WITH DIFFERENTIAL (CANCER CENTER ONLY)
BASO#: 0 10*3/uL (ref 0.0–0.2)
BASO%: 1 % (ref 0.0–2.0)
EOS ABS: 0 10*3/uL (ref 0.0–0.5)
EOS%: 0.5 % (ref 0.0–7.0)
HCT: 26.8 % — ABNORMAL LOW (ref 38.7–49.9)
HEMOGLOBIN: 8.8 g/dL — AB (ref 13.0–17.1)
LYMPH#: 0.9 10*3/uL (ref 0.9–3.3)
LYMPH%: 44.5 % (ref 14.0–48.0)
MCH: 34 pg — AB (ref 28.0–33.4)
MCHC: 32.8 g/dL (ref 32.0–35.9)
MCV: 104 fL — ABNORMAL HIGH (ref 82–98)
MONO#: 0.2 10*3/uL (ref 0.1–0.9)
MONO%: 10.5 % (ref 0.0–13.0)
NEUT%: 43.5 % (ref 40.0–80.0)
NEUTROS ABS: 0.9 10*3/uL — AB (ref 1.5–6.5)
Platelets: 106 10*3/uL — ABNORMAL LOW (ref 145–400)
RBC: 2.59 10*6/uL — ABNORMAL LOW (ref 4.20–5.70)
RDW: 15.4 % (ref 11.1–15.7)
WBC: 2 10*3/uL — ABNORMAL LOW (ref 4.0–10.0)

## 2015-08-28 LAB — CMP (CANCER CENTER ONLY)
ALBUMIN: 3.1 g/dL — AB (ref 3.3–5.5)
ALK PHOS: 40 U/L (ref 26–84)
ALT(SGPT): 28 U/L (ref 10–47)
AST: 33 U/L (ref 11–38)
BUN, Bld: 16 mg/dL (ref 7–22)
CHLORIDE: 106 meq/L (ref 98–108)
CO2: 27 meq/L (ref 18–33)
CREATININE: 1 mg/dL (ref 0.6–1.2)
Calcium: 9 mg/dL (ref 8.0–10.3)
Glucose, Bld: 84 mg/dL (ref 73–118)
Potassium: 4 mEq/L (ref 3.3–4.7)
SODIUM: 139 meq/L (ref 128–145)
Total Bilirubin: 0.6 mg/dl (ref 0.20–1.60)
Total Protein: 8.3 g/dL — ABNORMAL HIGH (ref 6.4–8.1)

## 2015-08-28 MED ORDER — SODIUM CHLORIDE 0.9 % IJ SOLN
10.0000 mL | INTRAMUSCULAR | Status: DC | PRN
  Administered 2015-08-28: 10 mL via INTRAVENOUS
  Filled 2015-08-28: qty 10

## 2015-08-28 MED ORDER — HEPARIN SOD (PORK) LOCK FLUSH 100 UNIT/ML IV SOLN
500.0000 [IU] | Freq: Once | INTRAVENOUS | Status: AC
  Administered 2015-08-28: 500 [IU] via INTRAVENOUS
  Filled 2015-08-28: qty 5

## 2015-08-28 NOTE — Progress Notes (Signed)
Patient did not received treatment today per Dr. [name] and ANC 0.9. Pt is aware and has new upcoming schedule.

## 2015-09-05 ENCOUNTER — Encounter (HOSPITAL_COMMUNITY)
Admission: RE | Admit: 2015-09-05 | Discharge: 2015-09-05 | Disposition: A | Discharge status: Home / Self Care | Payer: Medicare Other | Source: Ambulatory Visit | Attending: Family | Admitting: Family

## 2015-09-05 DIAGNOSIS — C775 Secondary and unspecified malignant neoplasm of intrapelvic lymph nodes: Secondary | ICD-10-CM | POA: Insufficient documentation

## 2015-09-05 DIAGNOSIS — C679 Malignant neoplasm of bladder, unspecified: Secondary | ICD-10-CM | POA: Insufficient documentation

## 2015-09-05 LAB — GLUCOSE, CAPILLARY: GLUCOSE-CAPILLARY: 80 mg/dL (ref 65–99)

## 2015-09-05 MED ORDER — FLUDEOXYGLUCOSE F - 18 (FDG) INJECTION
8.7900 | Freq: Once | INTRAVENOUS | Status: AC | PRN
  Administered 2015-09-05: 8.79 via INTRAVENOUS

## 2015-09-06 ENCOUNTER — Telehealth: Payer: Self-pay | Admitting: Family

## 2015-09-06 NOTE — Telephone Encounter (Signed)
Called and left message on patient's personal phone that PET scan results were stable. Call back number was given in case of questions. Will plan to see him next week on December 13th for follow-up.

## 2015-09-11 ENCOUNTER — Other Ambulatory Visit (HOSPITAL_BASED_OUTPATIENT_CLINIC_OR_DEPARTMENT_OTHER): Payer: Medicare Other

## 2015-09-11 ENCOUNTER — Ambulatory Visit (HOSPITAL_BASED_OUTPATIENT_CLINIC_OR_DEPARTMENT_OTHER): Payer: Medicare Other

## 2015-09-11 ENCOUNTER — Encounter: Payer: Self-pay | Admitting: Family

## 2015-09-11 ENCOUNTER — Other Ambulatory Visit: Payer: Self-pay | Admitting: *Deleted

## 2015-09-11 ENCOUNTER — Ambulatory Visit (HOSPITAL_BASED_OUTPATIENT_CLINIC_OR_DEPARTMENT_OTHER): Payer: Medicare Other | Admitting: Family

## 2015-09-11 VITALS — BP 116/79 | HR 77 | Temp 97.5°F | Resp 18 | Ht 74.0 in | Wt 190.0 lb

## 2015-09-11 DIAGNOSIS — C679 Malignant neoplasm of bladder, unspecified: Secondary | ICD-10-CM | POA: Diagnosis not present

## 2015-09-11 DIAGNOSIS — C775 Secondary and unspecified malignant neoplasm of intrapelvic lymph nodes: Principal | ICD-10-CM

## 2015-09-11 DIAGNOSIS — Z5111 Encounter for antineoplastic chemotherapy: Secondary | ICD-10-CM

## 2015-09-11 DIAGNOSIS — C7951 Secondary malignant neoplasm of bone: Secondary | ICD-10-CM

## 2015-09-11 LAB — CMP (CANCER CENTER ONLY)
ALT: 18 U/L (ref 10–47)
AST: 27 U/L (ref 11–38)
Albumin: 3.5 g/dL (ref 3.3–5.5)
Alkaline Phosphatase: 45 U/L (ref 26–84)
BUN: 16 mg/dL (ref 7–22)
CO2: 26 mEq/L (ref 18–33)
CREATININE: 1.1 mg/dL (ref 0.6–1.2)
Calcium: 9.5 mg/dL (ref 8.0–10.3)
Chloride: 103 mEq/L (ref 98–108)
GLUCOSE: 100 mg/dL (ref 73–118)
Potassium: 3.5 mEq/L (ref 3.3–4.7)
Sodium: 135 mEq/L (ref 128–145)
Total Bilirubin: 0.7 mg/dl (ref 0.20–1.60)
Total Protein: 8.3 g/dL — ABNORMAL HIGH (ref 6.4–8.1)

## 2015-09-11 LAB — CBC WITH DIFFERENTIAL (CANCER CENTER ONLY)
BASO#: 0 10*3/uL (ref 0.0–0.2)
BASO%: 0.6 % (ref 0.0–2.0)
EOS%: 1.2 % (ref 0.0–7.0)
Eosinophils Absolute: 0 10*3/uL (ref 0.0–0.5)
HEMATOCRIT: 32.1 % — AB (ref 38.7–49.9)
HGB: 10.5 g/dL — ABNORMAL LOW (ref 13.0–17.1)
LYMPH#: 1.2 10*3/uL (ref 0.9–3.3)
LYMPH%: 37.8 % (ref 14.0–48.0)
MCH: 34 pg — ABNORMAL HIGH (ref 28.0–33.4)
MCHC: 32.7 g/dL (ref 32.0–35.9)
MCV: 104 fL — ABNORMAL HIGH (ref 82–98)
MONO#: 0.6 10*3/uL (ref 0.1–0.9)
MONO%: 19.7 % — ABNORMAL HIGH (ref 0.0–13.0)
NEUT#: 1.3 10*3/uL — ABNORMAL LOW (ref 1.5–6.5)
NEUT%: 40.7 % (ref 40.0–80.0)
PLATELETS: 118 10*3/uL — AB (ref 145–400)
RBC: 3.09 10*6/uL — AB (ref 4.20–5.70)
RDW: 16.6 % — AB (ref 11.1–15.7)
WBC: 3.3 10*3/uL — AB (ref 4.0–10.0)

## 2015-09-11 LAB — LACTATE DEHYDROGENASE: LDH: 231 U/L (ref 125–245)

## 2015-09-11 MED ORDER — SODIUM CHLORIDE 0.9 % IV SOLN
500.0000 mg | Freq: Once | INTRAVENOUS | Status: AC
  Administered 2015-09-11: 500 mg via INTRAVENOUS
  Filled 2015-09-11: qty 50

## 2015-09-11 MED ORDER — HEPARIN SOD (PORK) LOCK FLUSH 100 UNIT/ML IV SOLN
500.0000 [IU] | Freq: Once | INTRAVENOUS | Status: AC | PRN
  Administered 2015-09-11: 500 [IU]
  Filled 2015-09-11: qty 5

## 2015-09-11 MED ORDER — SODIUM CHLORIDE 0.9 % IV SOLN
Freq: Once | INTRAVENOUS | Status: AC
  Administered 2015-09-11: 12:00:00 via INTRAVENOUS

## 2015-09-11 MED ORDER — SODIUM CHLORIDE 0.9 % IV SOLN
2000.0000 mg | Freq: Once | INTRAVENOUS | Status: AC
  Administered 2015-09-11: 2000 mg via INTRAVENOUS
  Filled 2015-09-11: qty 52.6

## 2015-09-11 MED ORDER — SODIUM CHLORIDE 0.9 % IV SOLN
Freq: Once | INTRAVENOUS | Status: AC
  Administered 2015-09-11: 14:00:00 via INTRAVENOUS
  Filled 2015-09-11: qty 8

## 2015-09-11 MED ORDER — SODIUM CHLORIDE 0.9 % IJ SOLN
10.0000 mL | INTRAMUSCULAR | Status: DC | PRN
  Administered 2015-09-11: 10 mL
  Filled 2015-09-11: qty 10

## 2015-09-11 MED ORDER — ZOLEDRONIC ACID 4 MG/100ML IV SOLN
4.0000 mg | Freq: Once | INTRAVENOUS | Status: AC
Start: 2015-09-11 — End: 2015-09-11
  Administered 2015-09-11: 4 mg via INTRAVENOUS
  Filled 2015-09-11: qty 100

## 2015-09-11 MED ORDER — CARBOPLATIN CHEMO INTRADERMAL TEST DOSE 100MCG/0.02ML
100.0000 ug | Freq: Once | INTRADERMAL | Status: AC
  Administered 2015-09-11: 100 ug via INTRADERMAL
  Filled 2015-09-11: qty 0.01

## 2015-09-11 NOTE — Patient Instructions (Signed)
[facility] Discharge Instructions for Patients Receiving Chemotherapy  Today you received the following chemotherapy agents Carboplatin and Gemzar and Zometa.  To help prevent nausea and vomiting after your treatment, we encourage you to take your nausea medication.   If you develop nausea and vomiting that is not controlled by your nausea medication, call the clinic.   BELOW ARE SYMPTOMS THAT SHOULD BE REPORTED IMMEDIATELY:  *FEVER GREATER THAN 100.5 F  *CHILLS WITH OR WITHOUT FEVER  NAUSEA AND VOMITING THAT IS NOT CONTROLLED WITH YOUR NAUSEA MEDICATION  *UNUSUAL SHORTNESS OF BREATH  *UNUSUAL BRUISING OR BLEEDING  TENDERNESS IN MOUTH AND THROAT WITH OR WITHOUT PRESENCE OF ULCERS  *URINARY PROBLEMS  *BOWEL PROBLEMS  UNUSUAL RASH Items with * indicate a potential emergency and should be followed up as soon as possible.  Feel free to call the clinic you have any questions or concerns. The clinic phone number is [phone].  Please show the [place] at check-in to the Emergency Department and triage nurse.

## 2015-09-11 NOTE — Progress Notes (Signed)
Hematology and Oncology Follow Up Visit  CANDIDO ZUKOSKI [id number] 05-Nov-1945 69 y.o. 09/11/2015   Principle Diagnosis:  Metastatic bladder cancer  Current Therapy:   Carboplatin/Gemzar s/p cycle 10 Zometa 4 mg IV every 3 weeks    Interim History:  Mr. Oneal is here today for a follow-up and cycle 11 of chemo. He has done very well with treatment. His PET scan last week showed stable to slight continued interval response to therapy.  He remains asymptomatic at this time.  No problem with infections. No fever, chills, n/v, cough, rash, dizziness, chest pain, palpitations, abdominal pain or changes in his bowel or bladder habits.  He has some mild SOB at times with exertion that is unchanged. He attributes this to being a former smoker.  No lymphadenopathy found on assessment. No episodes of bleeding or bruising.  No swelling, tenderness, numbness or tingling in his extremities.  He has maintained a healthy appetite and is staying well hydrated. His weight is unchanged.   Medications:    Medication List       This list is accurate as of: 09/11/15  9:45 PM.  Always use your most recent med list.               dexamethasone 4 MG tablet  Commonly known as:  DECADRON  Take 2 tablets (8 mg total) by mouth 2 (two) times daily with a meal. Start the day after chemotherapy for 3 days.     hydrochlorothiazide 12.5 MG capsule  Commonly known as:  MICROZIDE  TAKE 1 CAPSULE BY MOUTH EVERY MORNING  "OFFICE VISIT NEEDED FOR REFILLS"     lidocaine-prilocaine cream  Commonly known as:  EMLA  Apply 1 application topically as needed.     ondansetron 8 MG tablet  Commonly known as:  ZOFRAN  Take 1 tablet (8 mg total) by mouth 2 (two) times daily. Start the day after chemo for 3 days. Then take as needed for nausea or vomiting.     prochlorperazine 10 MG tablet  Commonly known as:  COMPAZINE  Take 1 tablet (10 mg total) by mouth every 6 (six) hours as needed (Nausea or vomiting).     tamsulosin 0.4 MG Caps capsule  Commonly known as:  FLOMAX  Take 2 capsules (0.8 mg total) by mouth daily.     VISINE OP  Apply 1-2 drops to eye daily as needed (redness.).        Allergies: No Known Allergies  Past Medical History, Surgical history, Social history, and Family History were reviewed and updated.  Review of Systems: All other 10 point review of systems is negative.   Physical Exam:  height is 6\' 2"  (1.88 m) and weight is 190 lb (86.183 kg). His oral temperature is 97.5 F (36.4 C). His blood pressure is 116/79 and his pulse is 77. His respiration is 18.   Wt Readings from Last 3 Encounters:  09/11/15 190 lb (86.183 kg)  08/21/15 190 lb (86.183 kg)  08/06/15 190 lb 6.4 oz (86.365 kg)    Ocular: Sclerae unicteric, pupils equal, round and reactive to light Ear-nose-throat: Oropharynx clear, dentition fair Lymphatic: No cervical supraclavicular or axillary adenopathy Lungs no rales or rhonchi, good excursion bilaterally Heart regular rate and rhythm, no murmur appreciated Abd soft, nontender, positive bowel sounds MSK no focal spinal tenderness, no joint edema Neuro: non-focal, well-oriented, appropriate affect Breasts: Deferred  Lab Results  Component Value Date   WBC 3.3* 09/11/2015   HGB 10.5* 09/11/2015  HCT 32.1* 09/11/2015   MCV 104* 09/11/2015   PLT 118* 09/11/2015   No results found for: FERRITIN, IRON, TIBC, UIBC, IRONPCTSAT Lab Results  Component Value Date   RBC 3.09* 09/11/2015   No results found for: KPAFRELGTCHN, LAMBDASER, KAPLAMBRATIO No results found for: IGGSERUM, IGA, IGMSERUM No results found for: Odetta Pink, SPEI   Chemistry      Component Value Date/Time   NA 135 09/11/2015 1038   NA 138 01/19/2015 1210   K 3.5 09/11/2015 1038   K 3.9 01/19/2015 1210   CL 103 09/11/2015 1038   CL 102 01/19/2015 1210   CO2 26 09/11/2015 1038   CO2 27 01/19/2015 1210   BUN 16  09/11/2015 1038   BUN 14 01/19/2015 1210   CREATININE 1.1 09/11/2015 1038   CREATININE 1.14 01/19/2015 1210      Component Value Date/Time   CALCIUM 9.5 09/11/2015 1038   CALCIUM 9.8 01/19/2015 1210   ALKPHOS 45 09/11/2015 1038   ALKPHOS 81 09/18/2014 1433   AST 27 09/11/2015 1038   AST 26 09/18/2014 1433   ALT 18 09/11/2015 1038   ALT 21 09/18/2014 1433   BILITOT 0.70 09/11/2015 1038   BILITOT 0.4 09/18/2014 1433     Impression and Plan: Mr. Vivo is very pleasant 69 yo African American male with poorly differentiated metastatic bladder cancer. His PET scan last week showed stable to slight continued interval response to therapy. He has done well with treatment and remains asymptomatic at this time.  His lab work today looks good. No anemia. Platelet count is up to 118.   We will proceed with cycle 11 of treatment today as planned.  He also requested his scans be put on a disc. We notified radiology downstairs and he will be able to pick this up after treatment on his way out.  He has his current appointment/treatment schedule.  He will contact us with any questions or concerns. We can certainly see him sooner if need be.   Eliezer Bottom, NP 12/13/20169:45 PM

## 2015-09-12 ENCOUNTER — Ambulatory Visit (HOSPITAL_COMMUNITY): Payer: Medicare Other

## 2015-09-17 ENCOUNTER — Other Ambulatory Visit: Payer: Self-pay | Admitting: *Deleted

## 2015-09-17 DIAGNOSIS — C679 Malignant neoplasm of bladder, unspecified: Secondary | ICD-10-CM

## 2015-09-17 DIAGNOSIS — C775 Secondary and unspecified malignant neoplasm of intrapelvic lymph nodes: Principal | ICD-10-CM

## 2015-09-18 ENCOUNTER — Ambulatory Visit (HOSPITAL_BASED_OUTPATIENT_CLINIC_OR_DEPARTMENT_OTHER): Payer: Medicare Other

## 2015-09-18 ENCOUNTER — Other Ambulatory Visit (HOSPITAL_BASED_OUTPATIENT_CLINIC_OR_DEPARTMENT_OTHER): Payer: Medicare Other

## 2015-09-18 VITALS — BP 124/78 | HR 70 | Temp 97.5°F | Resp 18

## 2015-09-18 DIAGNOSIS — C679 Malignant neoplasm of bladder, unspecified: Secondary | ICD-10-CM | POA: Diagnosis not present

## 2015-09-18 DIAGNOSIS — C775 Secondary and unspecified malignant neoplasm of intrapelvic lymph nodes: Principal | ICD-10-CM

## 2015-09-18 LAB — CBC WITH DIFFERENTIAL (CANCER CENTER ONLY)
BASO#: 0 10*3/uL (ref 0.0–0.2)
BASO%: 1 % (ref 0.0–2.0)
EOS ABS: 0 10*3/uL (ref 0.0–0.5)
EOS%: 1 % (ref 0.0–7.0)
HEMATOCRIT: 32.1 % — AB (ref 38.7–49.9)
HGB: 10.5 g/dL — ABNORMAL LOW (ref 13.0–17.1)
LYMPH#: 1.1 10*3/uL (ref 0.9–3.3)
LYMPH%: 52.7 % — ABNORMAL HIGH (ref 14.0–48.0)
MCH: 33.9 pg — AB (ref 28.0–33.4)
MCHC: 32.7 g/dL (ref 32.0–35.9)
MCV: 104 fL — AB (ref 82–98)
MONO#: 0.2 10*3/uL (ref 0.1–0.9)
MONO%: 9.5 % (ref 0.0–13.0)
NEUT#: 0.7 10*3/uL — ABNORMAL LOW (ref 1.5–6.5)
NEUT%: 35.8 % — ABNORMAL LOW (ref 40.0–80.0)
Platelets: 75 10*3/uL — ABNORMAL LOW (ref 145–400)
RBC: 3.1 10*6/uL — AB (ref 4.20–5.70)
RDW: 15.3 % (ref 11.1–15.7)
WBC: 2 10*3/uL — AB (ref 4.0–10.0)

## 2015-09-18 MED ORDER — HEPARIN SOD (PORK) LOCK FLUSH 100 UNIT/ML IV SOLN
250.0000 [IU] | Freq: Once | INTRAVENOUS | Status: DC | PRN
  Filled 2015-09-18: qty 5

## 2015-09-18 MED ORDER — SODIUM CHLORIDE 0.9 % IV SOLN
Freq: Once | INTRAVENOUS | Status: AC
  Administered 2015-09-18: 10:00:00 via INTRAVENOUS

## 2015-09-18 MED ORDER — SODIUM CHLORIDE 0.9 % IJ SOLN
3.0000 mL | INTRAMUSCULAR | Status: DC | PRN
  Filled 2015-09-18: qty 10

## 2015-09-18 MED ORDER — SODIUM CHLORIDE 0.9 % IJ SOLN
10.0000 mL | INTRAMUSCULAR | Status: DC | PRN
  Administered 2015-09-18: 10 mL
  Filled 2015-09-18: qty 10

## 2015-09-18 MED ORDER — SODIUM CHLORIDE 0.9 % IV SOLN: Freq: Once | INTRAVENOUS | Status: DC

## 2015-09-18 MED ORDER — HEPARIN SOD (PORK) LOCK FLUSH 100 UNIT/ML IV SOLN
500.0000 [IU] | Freq: Once | INTRAVENOUS | Status: AC | PRN
  Administered 2015-09-18: 500 [IU]
  Filled 2015-09-18: qty 5

## 2015-09-18 MED ORDER — PROCHLORPERAZINE MALEATE 10 MG PO TABS: 10.0000 mg | ORAL_TABLET | Freq: Once | ORAL | Status: DC

## 2015-09-18 MED ORDER — ALTEPLASE 2 MG IJ SOLR
2.0000 mg | Freq: Once | INTRAMUSCULAR | Status: DC | PRN
  Filled 2015-09-18: qty 2

## 2015-09-18 MED ORDER — SODIUM CHLORIDE 0.9 % IV SOLN: 1000.0000 mg/m2 | Freq: Once | INTRAVENOUS | Status: DC

## 2015-09-18 MED ORDER — PROCHLORPERAZINE MALEATE 10 MG PO TABS
ORAL_TABLET | ORAL | Status: AC
  Filled 2015-09-18: qty 1

## 2015-09-18 MED ORDER — PROCHLORPERAZINE MALEATE 10 MG PO TABS
10.0000 mg | ORAL_TABLET | Freq: Once | ORAL | Status: AC
  Administered 2015-09-18: 10 mg via ORAL

## 2015-09-18 NOTE — Progress Notes (Signed)
Treatment held per Dr [name] d/t ANC of 0.7. R/s x 1 week. Pt aware to expect call from scheduler. dph

## 2015-09-18 NOTE — Patient Instructions (Signed)
Neutropenia Neutropenia is a condition that occurs when the level of a certain type of white blood cell (neutrophil) in your body becomes lower than normal. Neutrophils are made in the bone marrow and fight infections. These cells protect against bacteria and viruses. The fewer neutrophils you have, and the longer your body remains without them, the greater your risk of getting a severe infection becomes. CAUSES  The cause of neutropenia may be hard to determine. However, it is usually due to 3 main problems:   Decreased production of neutrophils. This may be due to:  Certain medicines such as chemotherapy.  Genetic problems.  Cancer.  Radiation treatments.  Vitamin deficiency.  Some pesticides.  Increased destruction of neutrophils. This may be due to:  Overwhelming infections.  Hemolytic anemia. This is when the body destroys its own blood cells.  Chemotherapy.  Neutrophils moving to areas of the body where they cannot fight infections. This may be due to:  Dialysis procedures.  Conditions where the spleen becomes enlarged. Neutrophils are held in the spleen and are not available to the rest of the body.  Overwhelming infections. The neutrophils are held in the area of the infection and are not available to the rest of the body. SYMPTOMS  There are no specific symptoms of neutropenia. The lack of neutrophils can result in an infection, and an infection can cause various problems. DIAGNOSIS  Diagnosis is made by a blood test. A complete blood count is performed. The normal level of neutrophils in human blood differs with age and race. Infants have lower counts than older children and adults. African Americans have lower counts than Caucasians or Asians. The average adult level is 1500 cells/mm3 of blood. Neutrophil counts are interpreted as follows:  Greater than 1000 cells/mm3 gives normal protection against infection.  500 to 1000 cells/mm3 gives an increased risk for  infection.  200 to 500 cells/mm3 is a greater risk for severe infection.  Lower than 200 cells/mm3 is a marked risk of infection. This may require hospitalization and treatment with antibiotic medicines. TREATMENT  Treatment depends on the underlying cause, severity, and presence of infections or symptoms. It also depends on your health. Your caregiver will discuss the treatment plan with you. Mild cases are often easily treated and have a good outcome. Preventative measures may also be started to limit your risk of infections. Treatment can include:  Taking antibiotics.  Stopping medicines that are known to cause neutropenia.  Correcting nutritional deficiencies by eating green vegetables to supply folic acid and taking vitamin B supplements.  Stopping exposure to pesticides if your neutropenia is related to pesticide exposure.  Taking a blood growth factor called sargramostim, pegfilgrastim, or filgrastim if you are undergoing chemotherapy for cancer. This stimulates white blood cell production.  Removal of the spleen if you have Felty's syndrome and have repeated infections. HOME CARE INSTRUCTIONS   Follow your caregiver's instructions about when you need to have blood work done.  Wash your hands often. Make sure others who come in contact with you also wash their hands.  Wash raw fruits and vegetables before eating them. They can carry bacteria and fungi.  Avoid people with colds or spreadable (contagious) diseases (chickenpox, herpes zoster, influenza).  Avoid large crowds.  Avoid construction areas. The dust can release fungus into the air.  Be cautious around children in daycare or school environments.  Take care of your respiratory system by coughing and deep breathing.  Bathe daily.  Protect your skin from cuts and   burns.  Do not work in the garden or with flowers and plants.  Care for the mouth before and after meals by brushing with a soft toothbrush. If you have  mucositis, do not use mouthwash. Mouthwash contains alcohol and can dry out the mouth even more.  Clean the area between the genitals and the anus (perineal area) after urination and bowel movements. Women need to wipe from front to back.  Use a water soluble lubricant during sexual intercourse and practice good hygiene after. Do not have intercourse if you are severely neutropenic. Check with your caregiver for guidelines.  Exercise daily as tolerated.  Avoid people who were vaccinated with a live vaccine in the past 30 days. You should not receive live vaccines (polio, typhoid).  Do not provide direct care for pets. Avoid animal droppings. Do not clean litter boxes and bird cages.  Do not share food utensils.  Do not use tampons, enemas, or rectal suppositories unless directed by your caregiver.  Use an electric razor to remove hair.  Wash your hands after handling magazines, letters, and newspapers. SEEK IMMEDIATE MEDICAL CARE IF:   You have a fever.  You have chills or start to shake.  You feel nauseous or vomit.  You develop mouth sores.  You develop aches and pains.  You have redness and swelling around open wounds.  Your skin is warm to the touch.  You have pus coming from your wounds.  You develop swollen lymph nodes.  You feel weak or fatigued.  You develop red streaks on the skin. MAKE SURE YOU:  Understand these instructions.  Will watch your condition.  Will get help right away if you are not doing well or get worse.   This information is not intended to replace advice given to you by your health care provider. Make sure you discuss any questions you have with your health care provider.   Document Released: 03/07/2002 Document Revised: 12/08/2011 Document Reviewed: 03/28/2015 Elsevier Interactive Patient Education 2016 [employer] Inc.  

## 2015-09-19 ENCOUNTER — Other Ambulatory Visit: Payer: Medicare Other

## 2015-09-19 ENCOUNTER — Ambulatory Visit: Payer: Medicare Other | Admitting: Family

## 2015-09-25 ENCOUNTER — Other Ambulatory Visit (HOSPITAL_BASED_OUTPATIENT_CLINIC_OR_DEPARTMENT_OTHER): Payer: Medicare Other

## 2015-09-25 ENCOUNTER — Ambulatory Visit (HOSPITAL_BASED_OUTPATIENT_CLINIC_OR_DEPARTMENT_OTHER): Payer: Medicare Other

## 2015-09-25 ENCOUNTER — Other Ambulatory Visit: Payer: Self-pay

## 2015-09-25 VITALS — BP 138/82 | HR 67 | Temp 97.6°F | Resp 20 | Wt 192.0 lb

## 2015-09-25 DIAGNOSIS — C775 Secondary and unspecified malignant neoplasm of intrapelvic lymph nodes: Secondary | ICD-10-CM | POA: Diagnosis not present

## 2015-09-25 DIAGNOSIS — C679 Malignant neoplasm of bladder, unspecified: Secondary | ICD-10-CM | POA: Diagnosis not present

## 2015-09-25 DIAGNOSIS — Z452 Encounter for adjustment and management of vascular access device: Secondary | ICD-10-CM

## 2015-09-25 DIAGNOSIS — Z95828 Presence of other vascular implants and grafts: Secondary | ICD-10-CM

## 2015-09-25 LAB — CBC WITH DIFFERENTIAL (CANCER CENTER ONLY)
BASO#: 0 10*3/uL (ref 0.0–0.2)
BASO%: 0.5 % (ref 0.0–2.0)
EOS%: 0.8 % (ref 0.0–7.0)
Eosinophils Absolute: 0 10*3/uL (ref 0.0–0.5)
HEMATOCRIT: 31.9 % — AB (ref 38.7–49.9)
HGB: 10.4 g/dL — ABNORMAL LOW (ref 13.0–17.1)
LYMPH#: 1.1 10*3/uL (ref 0.9–3.3)
LYMPH%: 29.5 % (ref 14.0–48.0)
MCH: 34 pg — ABNORMAL HIGH (ref 28.0–33.4)
MCHC: 32.6 g/dL (ref 32.0–35.9)
MCV: 104 fL — AB (ref 82–98)
MONO#: 0.6 10*3/uL (ref 0.1–0.9)
MONO%: 15.7 % — ABNORMAL HIGH (ref 0.0–13.0)
NEUT#: 2 10*3/uL (ref 1.5–6.5)
NEUT%: 53.5 % (ref 40.0–80.0)
PLATELETS: 48 10*3/uL — AB (ref 145–400)
RBC: 3.06 10*6/uL — AB (ref 4.20–5.70)
RDW: 15.7 % (ref 11.1–15.7)
WBC: 3.7 10*3/uL — AB (ref 4.0–10.0)

## 2015-09-25 LAB — CMP (CANCER CENTER ONLY)
ALK PHOS: 40 U/L (ref 26–84)
ALT: 21 U/L (ref 10–47)
AST: 30 U/L (ref 11–38)
Albumin: 3.4 g/dL (ref 3.3–5.5)
BILIRUBIN TOTAL: 0.6 mg/dL (ref 0.20–1.60)
BUN: 20 mg/dL (ref 7–22)
CO2: 28 mEq/L (ref 18–33)
Calcium: 9.4 mg/dL (ref 8.0–10.3)
Chloride: 105 mEq/L (ref 98–108)
Creat: 1.1 mg/dl (ref 0.6–1.2)
GLUCOSE: 81 mg/dL (ref 73–118)
POTASSIUM: 3.8 meq/L (ref 3.3–4.7)
Sodium: 138 mEq/L (ref 128–145)
TOTAL PROTEIN: 8.3 g/dL — AB (ref 6.4–8.1)

## 2015-09-25 MED ORDER — HEPARIN SOD (PORK) LOCK FLUSH 100 UNIT/ML IV SOLN
500.0000 [IU] | Freq: Once | INTRAVENOUS | Status: AC
  Administered 2015-09-25: 500 [IU] via INTRAVENOUS
  Filled 2015-09-25: qty 5

## 2015-09-25 MED ORDER — SODIUM CHLORIDE 0.9 % IJ SOLN
10.0000 mL | INTRAMUSCULAR | Status: DC | PRN
  Administered 2015-09-25: 10 mL via INTRAVENOUS
  Filled 2015-09-25: qty 10

## 2015-09-25 NOTE — Progress Notes (Signed)
Will hold treatment today due to platelet count.

## 2015-09-25 NOTE — Patient Instructions (Signed)
Thrombocytopenia Thrombocytopenia means there are not enough platelets in your blood. Platelets are tiny cells in your blood. When you start bleeding, platelets clump together around the cut or injury to stop the bleeding. This process is called blood clotting. Not having enough platelets can cause bleeding problems. HOME CARE  Check your skin and inside your mouth for bruises or blood as told by your doctor.  Check your spit (sputum), pee (urine), and poop (stool) for blood as told by your doctor.  Do not do activities that can cause bumps or bruises until your doctor says it is okay.  Be careful not to cut yourself when you shave or use scissors, needles, knives, or other tools.  Be careful not to burn yourself when you iron or cook.  Ask your doctor if you can drink alcohol.  Only take medicines as told by your doctor.  Tell all your doctors and your dentist that you have this bleeding problem. GET HELP RIGHT AWAY IF:  You are bleeding anywhere on your body.  You are bleeding or have bruises without knowing why.  You have blood in your spit, pee, or poop. MAKE SURE YOU:  Understand these instructions.  Will watch your condition.  Will get help right away if you are not doing well or get worse.   This information is not intended to replace advice given to you by your health care provider. Make sure you discuss any questions you have with your health care provider.   Document Released: 09/04/2011 Document Revised: 12/08/2011 Document Reviewed: 03/19/2015 Elsevier Interactive Patient Education 2016 Elsevier Inc.  

## 2015-10-02 ENCOUNTER — Ambulatory Visit: Payer: Medicare Other

## 2015-10-02 ENCOUNTER — Ambulatory Visit: Payer: Medicare Other | Admitting: Hematology & Oncology

## 2015-10-02 ENCOUNTER — Other Ambulatory Visit: Payer: Medicare Other

## 2015-10-05 ENCOUNTER — Other Ambulatory Visit: Payer: Self-pay | Admitting: *Deleted

## 2015-10-05 DIAGNOSIS — C679 Malignant neoplasm of bladder, unspecified: Secondary | ICD-10-CM

## 2015-10-05 DIAGNOSIS — C775 Secondary and unspecified malignant neoplasm of intrapelvic lymph nodes: Principal | ICD-10-CM

## 2015-10-08 ENCOUNTER — Encounter: Payer: Self-pay | Admitting: Hematology & Oncology

## 2015-10-08 ENCOUNTER — Ambulatory Visit (HOSPITAL_BASED_OUTPATIENT_CLINIC_OR_DEPARTMENT_OTHER): Payer: Medicare Other

## 2015-10-08 ENCOUNTER — Ambulatory Visit (HOSPITAL_BASED_OUTPATIENT_CLINIC_OR_DEPARTMENT_OTHER): Payer: Medicare Other | Admitting: Hematology & Oncology

## 2015-10-08 ENCOUNTER — Other Ambulatory Visit (HOSPITAL_BASED_OUTPATIENT_CLINIC_OR_DEPARTMENT_OTHER): Payer: Medicare Other

## 2015-10-08 VITALS — BP 116/74 | HR 69 | Temp 98.2°F | Resp 16 | Ht 74.0 in | Wt 193.0 lb

## 2015-10-08 DIAGNOSIS — C679 Malignant neoplasm of bladder, unspecified: Secondary | ICD-10-CM

## 2015-10-08 DIAGNOSIS — D696 Thrombocytopenia, unspecified: Secondary | ICD-10-CM | POA: Diagnosis not present

## 2015-10-08 DIAGNOSIS — C7951 Secondary malignant neoplasm of bone: Secondary | ICD-10-CM

## 2015-10-08 DIAGNOSIS — C775 Secondary and unspecified malignant neoplasm of intrapelvic lymph nodes: Principal | ICD-10-CM

## 2015-10-08 DIAGNOSIS — Z5111 Encounter for antineoplastic chemotherapy: Secondary | ICD-10-CM

## 2015-10-08 LAB — CMP (CANCER CENTER ONLY)
ALT: 21 U/L (ref 10–47)
AST: 30 U/L (ref 11–38)
Albumin: 3.5 g/dL (ref 3.3–5.5)
Alkaline Phosphatase: 32 U/L (ref 26–84)
BUN: 18 mg/dL (ref 7–22)
CALCIUM: 9 mg/dL (ref 8.0–10.3)
CHLORIDE: 102 meq/L (ref 98–108)
CO2: 27 meq/L (ref 18–33)
Creat: 1.1 mg/dl (ref 0.6–1.2)
GLUCOSE: 79 mg/dL (ref 73–118)
POTASSIUM: 4 meq/L (ref 3.3–4.7)
Sodium: 141 mEq/L (ref 128–145)
Total Bilirubin: 0.7 mg/dl (ref 0.20–1.60)
Total Protein: 8.4 g/dL — ABNORMAL HIGH (ref 6.4–8.1)

## 2015-10-08 LAB — CBC WITH DIFFERENTIAL (CANCER CENTER ONLY)
BASO#: 0 10*3/uL (ref 0.0–0.2)
BASO%: 0.5 % (ref 0.0–2.0)
EOS%: 0.8 % (ref 0.0–7.0)
Eosinophils Absolute: 0 10*3/uL (ref 0.0–0.5)
HCT: 35 % — ABNORMAL LOW (ref 38.7–49.9)
HGB: 11.4 g/dL — ABNORMAL LOW (ref 13.0–17.1)
LYMPH#: 1.3 10*3/uL (ref 0.9–3.3)
LYMPH%: 32.4 % (ref 14.0–48.0)
MCH: 33.8 pg — AB (ref 28.0–33.4)
MCHC: 32.6 g/dL (ref 32.0–35.9)
MCV: 104 fL — AB (ref 82–98)
MONO#: 0.5 10*3/uL (ref 0.1–0.9)
MONO%: 13.2 % — ABNORMAL HIGH (ref 0.0–13.0)
NEUT#: 2.1 10*3/uL (ref 1.5–6.5)
NEUT%: 53.1 % (ref 40.0–80.0)
PLATELETS: 110 10*3/uL — AB (ref 145–400)
RBC: 3.37 10*6/uL — ABNORMAL LOW (ref 4.20–5.70)
RDW: 16 % — AB (ref 11.1–15.7)
WBC: 4 10*3/uL (ref 4.0–10.0)

## 2015-10-08 LAB — LACTATE DEHYDROGENASE: LDH: 249 U/L — ABNORMAL HIGH (ref 125–245)

## 2015-10-08 MED ORDER — ZOLEDRONIC ACID 4 MG/100ML IV SOLN
4.0000 mg | Freq: Once | INTRAVENOUS | Status: AC
  Administered 2015-10-08: 4 mg via INTRAVENOUS
  Filled 2015-10-08: qty 100

## 2015-10-08 MED ORDER — SODIUM CHLORIDE 0.9 % IV SOLN
500.0000 mg | Freq: Once | INTRAVENOUS | Status: AC
  Administered 2015-10-08: 500 mg via INTRAVENOUS
  Filled 2015-10-08: qty 50

## 2015-10-08 MED ORDER — SODIUM CHLORIDE 0.9 % IV SOLN
Freq: Once | INTRAVENOUS | Status: AC
  Administered 2015-10-08: 11:00:00 via INTRAVENOUS

## 2015-10-08 MED ORDER — HEPARIN SOD (PORK) LOCK FLUSH 100 UNIT/ML IV SOLN
500.0000 [IU] | Freq: Once | INTRAVENOUS | Status: AC | PRN
  Administered 2015-10-08: 500 [IU]
  Filled 2015-10-08: qty 5

## 2015-10-08 MED ORDER — SODIUM CHLORIDE 0.9 % IV SOLN
Freq: Once | INTRAVENOUS | Status: AC
  Administered 2015-10-08: 13:00:00 via INTRAVENOUS
  Filled 2015-10-08: qty 8

## 2015-10-08 MED ORDER — SODIUM CHLORIDE 0.9 % IJ SOLN
10.0000 mL | INTRAMUSCULAR | Status: DC | PRN
  Administered 2015-10-08: 10 mL
  Filled 2015-10-08: qty 10

## 2015-10-08 MED ORDER — SODIUM CHLORIDE 0.9 % IV SOLN
1600.0000 mg | Freq: Once | INTRAVENOUS | Status: AC
  Administered 2015-10-08: 1600 mg via INTRAVENOUS
  Filled 2015-10-08: qty 26.3

## 2015-10-08 MED ORDER — CARBOPLATIN CHEMO INTRADERMAL TEST DOSE 100MCG/0.02ML
100.0000 ug | Freq: Once | INTRADERMAL | Status: AC
  Administered 2015-10-08: 100 ug via INTRADERMAL
  Filled 2015-10-08: qty 0.01

## 2015-10-08 NOTE — Patient Instructions (Addendum)
[facility] Discharge Instructions for Patients Receiving Chemotherapy  Today you received the following chemotherapy agents Gemzar and Carboplatin and Zometa.  To help prevent nausea and vomiting after your treatment, we encourage you to take your nausea medication.   If you develop nausea and vomiting that is not controlled by your nausea medication, call the clinic.   BELOW ARE SYMPTOMS THAT SHOULD BE REPORTED IMMEDIATELY:  *FEVER GREATER THAN 100.5 F  *CHILLS WITH OR WITHOUT FEVER  NAUSEA AND VOMITING THAT IS NOT CONTROLLED WITH YOUR NAUSEA MEDICATION  *UNUSUAL SHORTNESS OF BREATH  *UNUSUAL BRUISING OR BLEEDING  TENDERNESS IN MOUTH AND THROAT WITH OR WITHOUT PRESENCE OF ULCERS  *URINARY PROBLEMS  *BOWEL PROBLEMS  UNUSUAL RASH Items with * indicate a potential emergency and should be followed up as soon as possible.  Feel free to call the clinic you have any questions or concerns. The clinic phone number is [phone].  Please show the [facility] at check-in to the Emergency Department and triage nurse.
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
## 2015-10-09 ENCOUNTER — Ambulatory Visit: Payer: Medicare Other

## 2015-10-09 ENCOUNTER — Other Ambulatory Visit: Payer: Medicare Other

## 2015-10-15 ENCOUNTER — Other Ambulatory Visit: Payer: Medicare Other

## 2015-10-15 ENCOUNTER — Other Ambulatory Visit: Payer: Self-pay | Admitting: *Deleted

## 2015-10-15 ENCOUNTER — Ambulatory Visit: Payer: Medicare Other

## 2015-10-15 DIAGNOSIS — C679 Malignant neoplasm of bladder, unspecified: Secondary | ICD-10-CM

## 2015-10-15 DIAGNOSIS — C775 Secondary and unspecified malignant neoplasm of intrapelvic lymph nodes: Principal | ICD-10-CM

## 2015-10-16 ENCOUNTER — Other Ambulatory Visit (HOSPITAL_BASED_OUTPATIENT_CLINIC_OR_DEPARTMENT_OTHER): Payer: Medicare Other

## 2015-10-16 ENCOUNTER — Ambulatory Visit (HOSPITAL_BASED_OUTPATIENT_CLINIC_OR_DEPARTMENT_OTHER): Payer: Medicare Other

## 2015-10-16 VITALS — BP 134/72 | HR 66 | Temp 97.7°F | Resp 16 | Wt 194.0 lb

## 2015-10-16 DIAGNOSIS — C679 Malignant neoplasm of bladder, unspecified: Secondary | ICD-10-CM | POA: Diagnosis not present

## 2015-10-16 DIAGNOSIS — C775 Secondary and unspecified malignant neoplasm of intrapelvic lymph nodes: Principal | ICD-10-CM

## 2015-10-16 LAB — CMP (CANCER CENTER ONLY)
ALK PHOS: 36 U/L (ref 26–84)
ALT: 26 U/L (ref 10–47)
AST: 29 U/L (ref 11–38)
Albumin: 3.3 g/dL (ref 3.3–5.5)
BILIRUBIN TOTAL: 0.7 mg/dL (ref 0.20–1.60)
BUN: 21 mg/dL (ref 7–22)
CALCIUM: 9.2 mg/dL (ref 8.0–10.3)
CO2: 27 mEq/L (ref 18–33)
Chloride: 106 mEq/L (ref 98–108)
Creat: 1.3 mg/dl — ABNORMAL HIGH (ref 0.6–1.2)
GLUCOSE: 93 mg/dL (ref 73–118)
Potassium: 4.1 mEq/L (ref 3.3–4.7)
Sodium: 141 mEq/L (ref 128–145)
Total Protein: 7.9 g/dL (ref 6.4–8.1)

## 2015-10-16 LAB — CBC WITH DIFFERENTIAL (CANCER CENTER ONLY)
BASO#: 0 10*3/uL (ref 0.0–0.2)
BASO%: 0.4 % (ref 0.0–2.0)
EOS%: 1.2 % (ref 0.0–7.0)
Eosinophils Absolute: 0 10*3/uL (ref 0.0–0.5)
HEMATOCRIT: 31.3 % — AB (ref 38.7–49.9)
HGB: 10.3 g/dL — ABNORMAL LOW (ref 13.0–17.1)
LYMPH#: 1.1 10*3/uL (ref 0.9–3.3)
LYMPH%: 42.9 % (ref 14.0–48.0)
MCH: 33.8 pg — ABNORMAL HIGH (ref 28.0–33.4)
MCHC: 32.9 g/dL (ref 32.0–35.9)
MCV: 103 fL — ABNORMAL HIGH (ref 82–98)
MONO#: 0.2 10*3/uL (ref 0.1–0.9)
MONO%: 6.7 % (ref 0.0–13.0)
NEUT%: 48.8 % (ref 40.0–80.0)
NEUTROS ABS: 1.2 10*3/uL — AB (ref 1.5–6.5)
PLATELETS: 51 10*3/uL — AB (ref 145–400)
RBC: 3.05 10*6/uL — ABNORMAL LOW (ref 4.20–5.70)
RDW: 14.7 % (ref 11.1–15.7)
WBC: 2.5 10*3/uL — AB (ref 4.0–10.0)

## 2015-10-16 MED ORDER — SODIUM CHLORIDE 0.9 % IJ SOLN
10.0000 mL | INTRAMUSCULAR | Status: DC | PRN
  Administered 2015-10-16: 10 mL via INTRAVENOUS
  Filled 2015-10-16: qty 10

## 2015-10-16 MED ORDER — HEPARIN SOD (PORK) LOCK FLUSH 100 UNIT/ML IV SOLN
500.0000 [IU] | Freq: Once | INTRAVENOUS | Status: AC
  Administered 2015-10-16: 500 [IU] via INTRAVENOUS
  Filled 2015-10-16: qty 5

## 2015-10-16 NOTE — Progress Notes (Signed)
Dr. [name] notified of lab work, will hold treatment today.  Pt notified, verbalized understanding.  Will call with next appointment.

## 2015-10-16 NOTE — Patient Instructions (Signed)

## 2015-10-19 ENCOUNTER — Other Ambulatory Visit: Payer: Self-pay | Admitting: Hematology & Oncology

## 2015-10-23 ENCOUNTER — Other Ambulatory Visit: Payer: Medicare Other

## 2015-10-23 ENCOUNTER — Ambulatory Visit: Payer: Medicare Other | Admitting: Hematology & Oncology

## 2015-10-23 ENCOUNTER — Ambulatory Visit: Payer: Medicare Other

## 2015-10-24 ENCOUNTER — Ambulatory Visit: Payer: Medicare Other | Admitting: Hematology & Oncology

## 2015-10-24 ENCOUNTER — Ambulatory Visit: Payer: Medicare Other

## 2015-10-24 ENCOUNTER — Other Ambulatory Visit: Payer: Medicare Other

## 2015-10-27 ENCOUNTER — Other Ambulatory Visit: Payer: Self-pay | Admitting: Internal Medicine

## 2015-10-30 ENCOUNTER — Ambulatory Visit (HOSPITAL_BASED_OUTPATIENT_CLINIC_OR_DEPARTMENT_OTHER): Payer: Medicare Other

## 2015-10-30 ENCOUNTER — Other Ambulatory Visit (HOSPITAL_BASED_OUTPATIENT_CLINIC_OR_DEPARTMENT_OTHER): Payer: Medicare Other

## 2015-10-30 ENCOUNTER — Ambulatory Visit (HOSPITAL_BASED_OUTPATIENT_CLINIC_OR_DEPARTMENT_OTHER): Payer: Medicare Other | Admitting: Hematology & Oncology

## 2015-10-30 ENCOUNTER — Encounter: Payer: Self-pay | Admitting: Hematology & Oncology

## 2015-10-30 ENCOUNTER — Ambulatory Visit: Payer: Medicare Other

## 2015-10-30 ENCOUNTER — Other Ambulatory Visit: Payer: Medicare Other

## 2015-10-30 VITALS — BP 124/66 | HR 72 | Temp 97.6°F | Resp 18 | Ht 74.0 in | Wt 194.0 lb

## 2015-10-30 DIAGNOSIS — Z5111 Encounter for antineoplastic chemotherapy: Secondary | ICD-10-CM

## 2015-10-30 DIAGNOSIS — C775 Secondary and unspecified malignant neoplasm of intrapelvic lymph nodes: Principal | ICD-10-CM

## 2015-10-30 DIAGNOSIS — C679 Malignant neoplasm of bladder, unspecified: Secondary | ICD-10-CM

## 2015-10-30 DIAGNOSIS — C7951 Secondary malignant neoplasm of bone: Secondary | ICD-10-CM

## 2015-10-30 LAB — CBC WITH DIFFERENTIAL (CANCER CENTER ONLY)
BASO#: 0 10*3/uL (ref 0.0–0.2)
BASO%: 0.3 % (ref 0.0–2.0)
EOS%: 0.9 % (ref 0.0–7.0)
Eosinophils Absolute: 0 10*3/uL (ref 0.0–0.5)
HEMATOCRIT: 33.7 % — AB (ref 38.7–49.9)
HGB: 11.1 g/dL — ABNORMAL LOW (ref 13.0–17.1)
LYMPH#: 1.1 10*3/uL (ref 0.9–3.3)
LYMPH%: 32 % (ref 14.0–48.0)
MCH: 34 pg — ABNORMAL HIGH (ref 28.0–33.4)
MCHC: 32.9 g/dL (ref 32.0–35.9)
MCV: 103 fL — ABNORMAL HIGH (ref 82–98)
MONO#: 0.7 10*3/uL (ref 0.1–0.9)
MONO%: 19.9 % — ABNORMAL HIGH (ref 0.0–13.0)
NEUT#: 1.6 10*3/uL (ref 1.5–6.5)
NEUT%: 46.9 % (ref 40.0–80.0)
Platelets: 100 10*3/uL — ABNORMAL LOW (ref 145–400)
RBC: 3.26 10*6/uL — ABNORMAL LOW (ref 4.20–5.70)
RDW: 15.6 % (ref 11.1–15.7)
WBC: 3.4 10*3/uL — ABNORMAL LOW (ref 4.0–10.0)

## 2015-10-30 LAB — CMP (CANCER CENTER ONLY)
ALK PHOS: 28 U/L (ref 26–84)
ALT: 20 U/L (ref 10–47)
AST: 44 U/L — AB (ref 11–38)
Albumin: 3.4 g/dL (ref 3.3–5.5)
BILIRUBIN TOTAL: 0.6 mg/dL (ref 0.20–1.60)
BUN, Bld: 18 mg/dL (ref 7–22)
CALCIUM: 9.6 mg/dL (ref 8.0–10.3)
CO2: 28 mEq/L (ref 18–33)
CREATININE: 1.3 mg/dL — AB (ref 0.6–1.2)
Chloride: 104 mEq/L (ref 98–108)
GLUCOSE: 91 mg/dL (ref 73–118)
Potassium: 3.9 mEq/L (ref 3.3–4.7)
Sodium: 140 mEq/L (ref 128–145)
Total Protein: 8.1 g/dL (ref 6.4–8.1)

## 2015-10-30 LAB — LACTATE DEHYDROGENASE: LDH: 304 U/L — ABNORMAL HIGH (ref 125–245)

## 2015-10-30 MED ORDER — HEPARIN SOD (PORK) LOCK FLUSH 100 UNIT/ML IV SOLN
500.0000 [IU] | Freq: Once | INTRAVENOUS | Status: AC | PRN
  Administered 2015-10-30: 500 [IU]
  Filled 2015-10-30: qty 5

## 2015-10-30 MED ORDER — SODIUM CHLORIDE 0.9 % IV SOLN
Freq: Once | INTRAVENOUS | Status: AC
  Administered 2015-10-30: 09:00:00 via INTRAVENOUS

## 2015-10-30 MED ORDER — SODIUM CHLORIDE 0.9 % IV SOLN
Freq: Once | INTRAVENOUS | Status: AC
  Administered 2015-10-30: 10:00:00 via INTRAVENOUS
  Filled 2015-10-30: qty 8

## 2015-10-30 MED ORDER — CARBOPLATIN CHEMO INTRADERMAL TEST DOSE 100MCG/0.02ML
100.0000 ug | Freq: Once | INTRADERMAL | Status: AC
  Administered 2015-10-30: 100 ug via INTRADERMAL
  Filled 2015-10-30: qty 0.02

## 2015-10-30 MED ORDER — SODIUM CHLORIDE 0.9 % IV SOLN
441.5000 mg | Freq: Once | INTRAVENOUS | Status: AC
  Administered 2015-10-30: 440 mg via INTRAVENOUS
  Filled 2015-10-30: qty 44

## 2015-10-30 MED ORDER — GEMCITABINE HCL CHEMO INJECTION 1 GM/26.3ML
1600.0000 mg | Freq: Once | INTRAVENOUS | Status: AC
  Administered 2015-10-30: 1600 mg via INTRAVENOUS
  Filled 2015-10-30: qty 10.52

## 2015-10-30 MED ORDER — SODIUM CHLORIDE 0.9 % IJ SOLN
10.0000 mL | INTRAMUSCULAR | Status: DC | PRN
  Administered 2015-10-30: 10 mL
  Filled 2015-10-30: qty 10

## 2015-10-30 MED ORDER — ZOLEDRONIC ACID 4 MG/100ML IV SOLN
4.0000 mg | Freq: Once | INTRAVENOUS | Status: AC
Start: 2015-10-30 — End: 2015-10-30
  Administered 2015-10-30: 4 mg via INTRAVENOUS
  Filled 2015-10-30: qty 100

## 2015-10-30 NOTE — Patient Instructions (Signed)
[facility] Discharge Instructions for Patients Receiving Chemotherapy  Today you received the following chemotherapy agents Carboplatin and Gemzar.  To help prevent nausea and vomiting after your treatment, we encourage you to take your nausea medication.   If you develop nausea and vomiting that is not controlled by your nausea medication, call the clinic.   BELOW ARE SYMPTOMS THAT SHOULD BE REPORTED IMMEDIATELY:  *FEVER GREATER THAN 100.5 F  *CHILLS WITH OR WITHOUT FEVER  NAUSEA AND VOMITING THAT IS NOT CONTROLLED WITH YOUR NAUSEA MEDICATION  *UNUSUAL SHORTNESS OF BREATH  *UNUSUAL BRUISING OR BLEEDING  TENDERNESS IN MOUTH AND THROAT WITH OR WITHOUT PRESENCE OF ULCERS  *URINARY PROBLEMS  *BOWEL PROBLEMS  UNUSUAL RASH Items with * indicate a potential emergency and should be followed up as soon as possible.  Feel free to call the clinic you have any questions or concerns. The clinic phone number is [phone].  Please show the CHEMO ALERT CARD at check-in to the Emergency Department and triage nurse.   

## 2015-10-30 NOTE — Progress Notes (Signed)
Hematology and Oncology Follow Up Visit  Dustin Jordan [id number] [birth year] 70 y.o. 10/30/2015   Principle Diagnosis:  Metastatic bladder cancer  Current Therapy:   Carboplatin/Gemzar s/p cycle 12 Zometa 4 mg IV every 3 weeks    Interim History:  Dustin Jordan is here today for a follow-up. He is doing okay. He had no problems over the weekend his appetite has been good. He's had no problems with nausea or vomiting. He's had no cough. He's had no rashes. He's had no leg swelling. He's had no mouth sores.  His last PET scan was in early December. He probably will be due for another PET scan in February.  He's had no bleeding.  Overall, his performance status is ECOG 1.  Medications:    Medication List       This list is accurate as of: 10/30/15  9:03 AM.  Always use your most recent med list.               dexamethasone 4 MG tablet  Commonly known as:  DECADRON  Take 2 tablets (8 mg total) by mouth 2 (two) times daily with a meal. Start the day after chemotherapy for 3 days.     hydrochlorothiazide 12.5 MG capsule  Commonly known as:  MICROZIDE  TAKE 1 CAPSULE BY MOUTH EVERY MORNING  "OFFICE VISIT NEEDED FOR REFILLS"     lidocaine-prilocaine cream  Commonly known as:  EMLA  Apply 1 application topically as needed.     ondansetron 8 MG tablet  Commonly known as:  ZOFRAN  Take 1 tablet (8 mg total) by mouth 2 (two) times daily. Start the day after chemo for 3 days. Then take as needed for nausea or vomiting.     prochlorperazine 10 MG tablet  Commonly known as:  COMPAZINE  Take 1 tablet (10 mg total) by mouth every 6 (six) hours as needed (Nausea or vomiting).     tamsulosin 0.4 MG Caps capsule  Commonly known as:  FLOMAX  Take 2 capsules (0.8 mg total) by mouth daily.     VISINE OP  Apply 1-2 drops to eye daily as needed (redness.).        Allergies: No Known Allergies  Past Medical History, Surgical history, Social history, and Family History were  reviewed and updated.  Review of Systems: All other 10 point review of systems is negative.   Physical Exam:  height is 6\' 2"  (1.88 m) and weight is 194 lb (87.998 kg). His oral temperature is 97.6 F (36.4 C). His blood pressure is 124/66 and his pulse is 72. His respiration is 18.   Wt Readings from Last 3 Encounters:  10/30/15 194 lb (87.998 kg)  10/16/15 194 lb (87.998 kg)  10/08/15 193 lb (87.544 kg)    Well-developed well-nourished Afro-American male in no obvious distress. Head and neck exam shows no ocular or oral lesion. He has no palpable cervical or supraclavicular lymph nodes. Lungs are clear. Cardiac exam regular rate and rhythm with no murmurs, rubs or bruits. Abdomen is soft. He has good bowel sounds. There is no fluid wave. There is no palpable liver or spleen tip. Back exam shows no tenderness over the spine, ribs or hips. X-ray shows no clubbing, cyanosis or edema. Neurological exam shows no focal neurological deficits. Skin exam shows no rashes, ecchymoses or petechia.  Lab Results  Component Value Date   WBC 3.4* 10/30/2015   HGB 11.1* 10/30/2015   HCT 33.7* 10/30/2015   MCV  103* 10/30/2015   PLT 100* 10/30/2015   No results found for: FERRITIN, IRON, TIBC, UIBC, IRONPCTSAT Lab Results  Component Value Date   RBC 3.26* 10/30/2015   No results found for: KPAFRELGTCHN, LAMBDASER, KAPLAMBRATIO No results found for: IGGSERUM, IGA, IGMSERUM No results found for: Odetta Pink, SPEI   Chemistry      Component Value Date/Time   NA 140 10/30/2015 0802   NA 138 01/19/2015 1210   K 3.9 10/30/2015 0802   K 3.9 01/19/2015 1210   CL 104 10/30/2015 0802   CL 102 01/19/2015 1210   CO2 28 10/30/2015 0802   CO2 27 01/19/2015 1210   BUN 18 10/30/2015 0802   BUN 14 01/19/2015 1210   CREATININE 1.3* 10/30/2015 0802   CREATININE 1.14 01/19/2015 1210      Component Value Date/Time   CALCIUM 9.6 10/30/2015 0802    CALCIUM 9.8 01/19/2015 1210   ALKPHOS 28 10/30/2015 0802   ALKPHOS 81 09/18/2014 1433   AST 44* 10/30/2015 0802   AST 26 09/18/2014 1433   ALT 20 10/30/2015 0802   ALT 21 09/18/2014 1433   BILITOT 0.60 10/30/2015 0802   BILITOT 0.4 09/18/2014 1433     Impression and Plan: Dustin Jordan is very pleasant 70 yo African American male with poorly differentiated metastatic bladder cancer.   Thankfully, his blood counts are better today. His bone marrow is taking a little bit of a hit from the treatments. As such, we may have to consider making an adjustment with his treatments. Her graft his last PET scan was done back in early December. I will do one before his next treatment in February.  I am glad that his quality of life is so good.  We will see him back in 3 weeks.    [name], MD 1/31/20179:03 AM

## 2015-11-01 ENCOUNTER — Telehealth: Payer: Self-pay | Admitting: Hematology & Oncology

## 2015-11-01 NOTE — Telephone Encounter (Signed)
Called patient's home number and left a message on patient voicemail regarding upcoming appt.       [facility].

## 2015-11-05 ENCOUNTER — Other Ambulatory Visit: Payer: Self-pay | Admitting: *Deleted

## 2015-11-05 ENCOUNTER — Telehealth: Payer: Self-pay | Admitting: Hematology & Oncology

## 2015-11-05 DIAGNOSIS — C679 Malignant neoplasm of bladder, unspecified: Secondary | ICD-10-CM

## 2015-11-05 DIAGNOSIS — C775 Secondary and unspecified malignant neoplasm of intrapelvic lymph nodes: Principal | ICD-10-CM

## 2015-11-05 NOTE — Telephone Encounter (Signed)
As part of our Prior Authorization Reduction program, this [employer] Medicare Advantage members plan does not currently require a prior authorization to receive these services in your state.  If the member receives this care outside of your state*, prior authorization may still be required before the services are delivered. You can request a prior authorization by calling the [employer] Clinical Request Line at [phone], option 4. The care provider delivering the services in the other state can also request a prior authorization online or over the phone.   If you have general questions about the prior authorization program, please call us at [phone], or visit:   [email or web address] > Clinician Resources > Cardiology > Cardiology Notification/Prior Authorization   [email or web address] > Clinician Resources > Radiology > Radiology Notification/Prior Authorization   [email or web address] > Clinician Resources > Oncology > Medicare Advantage Therapeutic Radiation.   Thank you.   *The Prior Authorization Reduction program currently applies to care providers in [place], [place], [place], [place], [place], [place] and [place] delivering care to [employer] members. Services delivered outside of those states may require prior authorization.  Please print and retain a copy of this confirmation in the patient's medical record. Physician Name: Dr. 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

## 2015-11-06 ENCOUNTER — Ambulatory Visit (HOSPITAL_BASED_OUTPATIENT_CLINIC_OR_DEPARTMENT_OTHER): Payer: Medicare Other

## 2015-11-06 ENCOUNTER — Other Ambulatory Visit (HOSPITAL_BASED_OUTPATIENT_CLINIC_OR_DEPARTMENT_OTHER): Payer: Medicare Other

## 2015-11-06 VITALS — BP 156/84 | HR 59 | Temp 98.2°F | Resp 18

## 2015-11-06 DIAGNOSIS — C679 Malignant neoplasm of bladder, unspecified: Secondary | ICD-10-CM

## 2015-11-06 DIAGNOSIS — C775 Secondary and unspecified malignant neoplasm of intrapelvic lymph nodes: Principal | ICD-10-CM

## 2015-11-06 DIAGNOSIS — Z5111 Encounter for antineoplastic chemotherapy: Secondary | ICD-10-CM | POA: Diagnosis not present

## 2015-11-06 LAB — CBC WITH DIFFERENTIAL (CANCER CENTER ONLY)
BASO#: 0 10*3/uL (ref 0.0–0.2)
BASO%: 0.4 % (ref 0.0–2.0)
EOS ABS: 0 10*3/uL (ref 0.0–0.5)
EOS%: 0.4 % (ref 0.0–7.0)
HEMATOCRIT: 33.1 % — AB (ref 38.7–49.9)
HGB: 11.2 g/dL — ABNORMAL LOW (ref 13.0–17.1)
LYMPH#: 1.3 10*3/uL (ref 0.9–3.3)
LYMPH%: 52.4 % — AB (ref 14.0–48.0)
MCH: 34.1 pg — AB (ref 28.0–33.4)
MCHC: 33.8 g/dL (ref 32.0–35.9)
MCV: 101 fL — AB (ref 82–98)
MONO#: 0.2 10*3/uL (ref 0.1–0.9)
MONO%: 6.5 % (ref 0.0–13.0)
NEUT#: 1 10*3/uL — ABNORMAL LOW (ref 1.5–6.5)
NEUT%: 40.3 % (ref 40.0–80.0)
PLATELETS: 61 10*3/uL — AB (ref 145–400)
RBC: 3.28 10*6/uL — ABNORMAL LOW (ref 4.20–5.70)
RDW: 14.2 % (ref 11.1–15.7)
WBC: 2.5 10*3/uL — ABNORMAL LOW (ref 4.0–10.0)

## 2015-11-06 LAB — CMP (CANCER CENTER ONLY)
ALK PHOS: 39 U/L (ref 26–84)
ALT: 27 U/L (ref 10–47)
AST: 37 U/L (ref 11–38)
Albumin: 3.5 g/dL (ref 3.3–5.5)
BUN: 20 mg/dL (ref 7–22)
CALCIUM: 9.8 mg/dL (ref 8.0–10.3)
CHLORIDE: 104 meq/L (ref 98–108)
CO2: 28 meq/L (ref 18–33)
Creat: 1 mg/dl (ref 0.6–1.2)
GLUCOSE: 105 mg/dL (ref 73–118)
POTASSIUM: 4 meq/L (ref 3.3–4.7)
Sodium: 137 mEq/L (ref 128–145)
Total Bilirubin: 0.6 mg/dl (ref 0.20–1.60)
Total Protein: 8.5 g/dL — ABNORMAL HIGH (ref 6.4–8.1)

## 2015-11-06 LAB — LACTATE DEHYDROGENASE: LDH: 221 U/L (ref 125–245)

## 2015-11-06 MED ORDER — SODIUM CHLORIDE 0.9 % IV SOLN
Freq: Once | INTRAVENOUS | Status: AC
  Administered 2015-11-06: 11:00:00 via INTRAVENOUS

## 2015-11-06 MED ORDER — HEPARIN SOD (PORK) LOCK FLUSH 100 UNIT/ML IV SOLN
500.0000 [IU] | Freq: Once | INTRAVENOUS | Status: AC | PRN
  Administered 2015-11-06: 500 [IU]
  Filled 2015-11-06: qty 5

## 2015-11-06 MED ORDER — SODIUM CHLORIDE 0.9 % IJ SOLN
10.0000 mL | INTRAMUSCULAR | Status: DC | PRN
  Administered 2015-11-06: 10 mL
  Filled 2015-11-06: qty 10

## 2015-11-06 MED ORDER — PROCHLORPERAZINE MALEATE 10 MG PO TABS
ORAL_TABLET | ORAL | Status: AC
  Filled 2015-11-06: qty 1

## 2015-11-06 MED ORDER — SODIUM CHLORIDE 0.9 % IV SOLN: 1300.0000 mg | Freq: Once | INTRAVENOUS | Status: DC

## 2015-11-06 MED ORDER — PROCHLORPERAZINE MALEATE 10 MG PO TABS
10.0000 mg | ORAL_TABLET | Freq: Once | ORAL | Status: AC
  Administered 2015-11-06: 10 mg via ORAL

## 2015-11-06 MED ORDER — GEMCITABINE HCL CHEMO INJECTION 1 GM/26.3ML
640.0000 mg/m2 | Freq: Once | INTRAVENOUS | Status: AC
  Administered 2015-11-06: 1330 mg via INTRAVENOUS
  Filled 2015-11-06: qty 8.68

## 2015-11-06 NOTE — Progress Notes (Signed)
Ok to treat per Dr [name]; Dose reduced.

## 2015-11-06 NOTE — Patient Instructions (Signed)
[facility] Discharge Instructions for Patients Receiving Chemotherapy  Today you received the following chemotherapy agents Gemzar.  To help prevent nausea and vomiting after your treatment, we encourage you to take your nausea medication.   If you develop nausea and vomiting that is not controlled by your nausea medication, call the clinic.   BELOW ARE SYMPTOMS THAT SHOULD BE REPORTED IMMEDIATELY:  *FEVER GREATER THAN 100.5 F  *CHILLS WITH OR WITHOUT FEVER  NAUSEA AND VOMITING THAT IS NOT CONTROLLED WITH YOUR NAUSEA MEDICATION  *UNUSUAL SHORTNESS OF BREATH  *UNUSUAL BRUISING OR BLEEDING  TENDERNESS IN MOUTH AND THROAT WITH OR WITHOUT PRESENCE OF ULCERS  *URINARY PROBLEMS  *BOWEL PROBLEMS  UNUSUAL RASH Items with * indicate a potential emergency and should be followed up as soon as possible.  Feel free to call the clinic you have any questions or concerns. The clinic phone number is [phone].  Please show the CHEMO ALERT CARD at check-in to the Emergency Department and triage nurse.   

## 2015-11-07 ENCOUNTER — Telehealth: Payer: Self-pay | Admitting: Family Medicine

## 2015-11-07 NOTE — Telephone Encounter (Signed)
Left a message for patient to return call.  Would like to see if he ever called [facility] to set up his colonoscopy?  If not we need to do this for him or have him call and do it.

## 2015-11-07 NOTE — Telephone Encounter (Signed)
Patient returned call and he is not ready to have a colonoscopy right now.  Will try next year or two he stated.

## 2015-11-14 ENCOUNTER — Encounter: Payer: Medicare Other | Admitting: Family Medicine

## 2015-11-15 ENCOUNTER — Ambulatory Visit (HOSPITAL_COMMUNITY)
Admission: RE | Admit: 2015-11-15 | Discharge: 2015-11-15 | Disposition: A | Discharge status: Home / Self Care | Payer: Medicare Other | Source: Ambulatory Visit | Attending: Hematology & Oncology | Admitting: Hematology & Oncology

## 2015-11-15 DIAGNOSIS — R937 Abnormal findings on diagnostic imaging of other parts of musculoskeletal system: Secondary | ICD-10-CM | POA: Insufficient documentation

## 2015-11-15 DIAGNOSIS — C679 Malignant neoplasm of bladder, unspecified: Secondary | ICD-10-CM | POA: Insufficient documentation

## 2015-11-15 DIAGNOSIS — C775 Secondary and unspecified malignant neoplasm of intrapelvic lymph nodes: Secondary | ICD-10-CM | POA: Insufficient documentation

## 2015-11-15 LAB — GLUCOSE, CAPILLARY: Glucose-Capillary: 86 mg/dL (ref 65–99)

## 2015-11-15 MED ORDER — FLUDEOXYGLUCOSE F - 18 (FDG) INJECTION
8.5000 | Freq: Once | INTRAVENOUS | Status: AC | PRN
  Administered 2015-11-15: 8.5 via INTRAVENOUS

## 2015-11-16 ENCOUNTER — Telehealth: Payer: Self-pay

## 2015-11-16 NOTE — Telephone Encounter (Deleted)
-----   Message from [name], MD sent at 11/15/2015  5:11 PM EST ----- Call - the cancer is still shrinking!!!  No new obvious cancer spots.  pete

## 2015-11-16 NOTE — Telephone Encounter (Addendum)
-----   Message from [name], MD sent at 11/15/2015  5:11 PM EST ----- Call - the cancer is still shrinking!!!  No new obvious cancer spots.  Dustin Jordan   Patient call and made aware of his cancer shrinking and no obvious cancer spots seen.

## 2015-11-19 ENCOUNTER — Other Ambulatory Visit (HOSPITAL_BASED_OUTPATIENT_CLINIC_OR_DEPARTMENT_OTHER): Payer: Medicare Other

## 2015-11-19 ENCOUNTER — Ambulatory Visit (HOSPITAL_BASED_OUTPATIENT_CLINIC_OR_DEPARTMENT_OTHER): Payer: Medicare Other

## 2015-11-19 ENCOUNTER — Ambulatory Visit (HOSPITAL_BASED_OUTPATIENT_CLINIC_OR_DEPARTMENT_OTHER): Payer: Medicare Other | Admitting: Hematology & Oncology

## 2015-11-19 VITALS — BP 139/78 | HR 78 | Temp 97.4°F | Resp 18

## 2015-11-19 DIAGNOSIS — C679 Malignant neoplasm of bladder, unspecified: Secondary | ICD-10-CM

## 2015-11-19 DIAGNOSIS — C775 Secondary and unspecified malignant neoplasm of intrapelvic lymph nodes: Principal | ICD-10-CM

## 2015-11-19 DIAGNOSIS — Z95828 Presence of other vascular implants and grafts: Secondary | ICD-10-CM

## 2015-11-19 LAB — BASIC METABOLIC PANEL - CANCER CENTER ONLY
BUN, Bld: 19 mg/dL (ref 7–22)
CHLORIDE: 107 meq/L (ref 98–108)
CO2: 27 meq/L (ref 18–33)
Calcium: 9.6 mg/dL (ref 8.0–10.3)
Creat: 1.3 mg/dl — ABNORMAL HIGH (ref 0.6–1.2)
GLUCOSE: 116 mg/dL (ref 73–118)
POTASSIUM: 3.7 meq/L (ref 3.3–4.7)
SODIUM: 139 meq/L (ref 128–145)

## 2015-11-19 LAB — CBC WITH DIFFERENTIAL (CANCER CENTER ONLY)
BASO#: 0 10*3/uL (ref 0.0–0.2)
BASO%: 0.4 % (ref 0.0–2.0)
EOS%: 1.1 % (ref 0.0–7.0)
Eosinophils Absolute: 0 10*3/uL (ref 0.0–0.5)
HEMATOCRIT: 30.4 % — AB (ref 38.7–49.9)
HEMOGLOBIN: 10.2 g/dL — AB (ref 13.0–17.1)
LYMPH#: 1.1 10*3/uL (ref 0.9–3.3)
LYMPH%: 40.6 % (ref 14.0–48.0)
MCH: 34.3 pg — ABNORMAL HIGH (ref 28.0–33.4)
MCHC: 33.6 g/dL (ref 32.0–35.9)
MCV: 102 fL — ABNORMAL HIGH (ref 82–98)
MONO#: 0.5 10*3/uL (ref 0.1–0.9)
MONO%: 17.7 % — AB (ref 0.0–13.0)
NEUT%: 40.2 % (ref 40.0–80.0)
NEUTROS ABS: 1.1 10*3/uL — AB (ref 1.5–6.5)
Platelets: 51 10*3/uL — ABNORMAL LOW (ref 145–400)
RBC: 2.97 10*6/uL — AB (ref 4.20–5.70)
RDW: 14.8 % (ref 11.1–15.7)
WBC: 2.7 10*3/uL — AB (ref 4.0–10.0)

## 2015-11-19 LAB — LACTATE DEHYDROGENASE: LDH: 195 U/L (ref 125–245)

## 2015-11-19 MED ORDER — SODIUM CHLORIDE 0.9% FLUSH
10.0000 mL | INTRAVENOUS | Status: DC | PRN
  Filled 2015-11-19: qty 10

## 2015-11-19 MED ORDER — HEPARIN SOD (PORK) LOCK FLUSH 100 UNIT/ML IV SOLN
500.0000 [IU] | Freq: Once | INTRAVENOUS | Status: AC
  Administered 2015-11-19: 500 [IU] via INTRAVENOUS
  Filled 2015-11-19: qty 5

## 2015-11-19 NOTE — Patient Instructions (Signed)

## 2015-11-19 NOTE — Progress Notes (Signed)
Hematology and Oncology Follow Up Visit  Dustin Jordan [id number] [birth year] 70 y.o. 11/19/2015   Principle Diagnosis:  Metastatic bladder cancer  Current Therapy:   Carboplatin/Gemzar s/p cycle 13 Zometa 4 mg IV every 3 weeks    Interim History:  Dustin Jordan is here today for a follow-up. Dustin Jordan is doing okay. Dustin Jordan had no problems over the weekend   We did go ahead and repeat a PET scan on Dustin Jordan. This was done on February 16. PET scan showed continued improvement in the lymph nodes.  The problem that we are having, however, is that his blood counts are having a temperature time with treatment. I think that we need to hold on day 8 of therapy.  I taught Dustin Jordan about this. Dustin Jordan is okay with this. As such, I think one day every 3 weeks with help his blood counts better.  Dustin Jordan's had no problems with bleeding. Dustin Jordan's had no issues with bowels or bladder. Dustin Jordan's had no nausea or vomiting.  Dustin Jordan's had no leg swelling.  Overall, his performance status is ECOG 1.  Medications:    Medication List       This list is accurate as of: 11/19/15 11:58 AM.  Always use your most recent med list.               dexamethasone 4 MG tablet  Commonly known as:  DECADRON  Take 2 tablets (8 mg total) by mouth 2 (two) times daily with a meal. Start the day after chemotherapy for 3 days.     hydrochlorothiazide 12.5 MG capsule  Commonly known as:  MICROZIDE  TAKE 1 CAPSULE BY MOUTH EVERY MORNING( OFFICE VISIT NEEDED FOR REFILLS)     lidocaine-prilocaine cream  Commonly known as:  EMLA  Apply 1 application topically as needed.     ondansetron 8 MG tablet  Commonly known as:  ZOFRAN  Take 1 tablet (8 mg total) by mouth 2 (two) times daily. Start the day after chemo for 3 days. Then take as needed for nausea or vomiting.     prochlorperazine 10 MG tablet  Commonly known as:  COMPAZINE  Take 1 tablet (10 mg total) by mouth every 6 (six) hours as needed (Nausea or vomiting).     tamsulosin 0.4 MG Caps capsule   Commonly known as:  FLOMAX  Take 2 capsules (0.8 mg total) by mouth daily.     VISINE OP  Apply 1-2 drops to eye daily as needed (redness.).        Allergies: No Known Allergies  Past Medical History, Surgical history, Social history, and Family History were reviewed and updated.  Review of Systems: All other 10 point review of systems is negative.   Physical Exam:  oral temperature is 97.4 F (36.3 C). His blood pressure is 139/78 and his pulse is 78. His respiration is 18.   Wt Readings from Last 3 Encounters:  10/30/15 194 lb (87.998 kg)  10/16/15 194 lb (87.998 kg)  10/08/15 193 lb (87.544 kg)    Well-developed well-nourished Afro-American male in no obvious distress. Head and neck exam shows no ocular or oral lesion. Dustin Jordan has no palpable cervical or supraclavicular lymph nodes. Lungs are clear. Cardiac exam regular rate and rhythm with no murmurs, rubs or bruits. Abdomen is soft. Dustin Jordan has good bowel sounds. There is no fluid wave. There is no palpable liver or spleen tip. Back exam shows no tenderness over the spine, ribs or hips. X-ray shows no clubbing, cyanosis or  edema. Neurological exam shows no focal neurological deficits. Skin exam shows no rashes, ecchymoses or petechia.  Lab Results  Component Value Date   WBC 2.7* 11/19/2015   HGB 10.2* 11/19/2015   HCT 30.4* 11/19/2015   MCV 102* 11/19/2015   PLT 51* 11/19/2015   No results found for: FERRITIN, IRON, TIBC, UIBC, IRONPCTSAT Lab Results  Component Value Date   RBC 2.97* 11/19/2015   No results found for: KPAFRELGTCHN, LAMBDASER, KAPLAMBRATIO No results found for: Kandis Cocking, IGMSERUM No results found for: Odetta Pink, SPEI   Chemistry      Component Value Date/Time   NA 139 11/19/2015 1045   NA 138 01/19/2015 1210   K 3.7 11/19/2015 1045   K 3.9 01/19/2015 1210   CL 107 11/19/2015 1045   CL 102 01/19/2015 1210   CO2 27 11/19/2015 1045   CO2 27  01/19/2015 1210   BUN 19 11/19/2015 1045   BUN 14 01/19/2015 1210   CREATININE 1.3* 11/19/2015 1045   CREATININE 1.14 01/19/2015 1210      Component Value Date/Time   CALCIUM 9.6 11/19/2015 1045   CALCIUM 9.8 01/19/2015 1210   ALKPHOS 39 11/06/2015 0858   ALKPHOS 81 09/18/2014 1433   AST 37 11/06/2015 0858   AST 26 09/18/2014 1433   ALT 27 11/06/2015 0858   ALT 21 09/18/2014 1433   BILITOT 0.60 11/06/2015 0858   BILITOT 0.4 09/18/2014 1433     Impression and Plan: Dustin Jordan is very pleasant 70 yo African American male with poorly differentiated metastatic bladder cancer.   I am going to remove his treatment back to next week. I just think that is platelet count is too low for therapy.  We will plan to treat Dustin Jordan just 1 day every 3 weeks.  At some point, we may think about giving Dustin Jordan a "vacation" from treatment. This might not be a bad idea.  I will give Dustin Jordan 3 more cycles of therapy and then repeat his scans. If everything looks okay, then I will hold off on treatment to let Dustin Jordan recover and be able to have a more productive life.  I spent about 35 minutes with Dustin Jordan today.   We will see Dustin Jordan back next week so that we can see how Dustin Jordan is doing his blood counts are done.   [name], MD 2/20/201711:58 AM

## 2015-11-19 NOTE — Progress Notes (Signed)
Treatment held today per Dr [name] d/t low platelets. dph

## 2015-11-21 ENCOUNTER — Encounter: Payer: Self-pay | Admitting: Family Medicine

## 2015-11-21 ENCOUNTER — Ambulatory Visit (INDEPENDENT_AMBULATORY_CARE_PROVIDER_SITE_OTHER): Payer: Medicare Other | Admitting: Family Medicine

## 2015-11-21 VITALS — BP 136/74 | HR 68 | Temp 97.9°F | Resp 16 | Ht 74.0 in | Wt 198.2 lb

## 2015-11-21 DIAGNOSIS — Z13220 Encounter for screening for lipoid disorders: Secondary | ICD-10-CM

## 2015-11-21 DIAGNOSIS — Z125 Encounter for screening for malignant neoplasm of prostate: Secondary | ICD-10-CM | POA: Diagnosis not present

## 2015-11-21 DIAGNOSIS — C679 Malignant neoplasm of bladder, unspecified: Secondary | ICD-10-CM

## 2015-11-21 DIAGNOSIS — C775 Secondary and unspecified malignant neoplasm of intrapelvic lymph nodes: Secondary | ICD-10-CM

## 2015-11-21 DIAGNOSIS — Z0000 Encounter for general adult medical examination without abnormal findings: Secondary | ICD-10-CM

## 2015-11-21 DIAGNOSIS — Z131 Encounter for screening for diabetes mellitus: Secondary | ICD-10-CM | POA: Diagnosis not present

## 2015-11-21 DIAGNOSIS — I10 Essential (primary) hypertension: Secondary | ICD-10-CM

## 2015-11-21 MED ORDER — HYDROCHLOROTHIAZIDE 12.5 MG PO CAPS: ORAL_CAPSULE | ORAL | Status: DC

## 2015-11-21 NOTE — Patient Instructions (Addendum)
Follow up with Dr. [name] for colonoscopy - let me know if I need to refer you.   If more than a year ago - recommend visit with ophthalmology.  Return for fasting blood work in next 1 week.   Keeping you healthy  Get these tests  Blood pressure- Have your blood pressure checked once a year by your healthcare provider.  Normal blood pressure is 120/80  Weight- Have your body mass index (BMI) calculated to screen for obesity.  BMI is a measure of body fat based on height and weight. You can also calculate your own BMI at ViewBanking.[email or web address].  Cholesterol- Have your cholesterol checked every year.  Diabetes- Have your blood sugar checked regularly if you have high blood pressure, high cholesterol, have a family history of diabetes or if you are overweight.  Screening for Colon Cancer- Colonoscopy starting at age 52.  Screening may begin sooner depending on your family history and other health conditions. Follow up colonoscopy as directed by your Gastroenterologist.  Screening for Prostate Cancer- Both blood work (PSA) and a rectal exam help screen for Prostate Cancer.  Screening begins at age 12 with African-American men and at age 21 with Caucasian men.  Screening may begin sooner depending on your family history.  Take these medicines  Aspirin- One aspirin daily can help prevent Heart disease and Stroke.  Flu shot- Every fall.  Tetanus- Every 10 years.  Zostavax- Once after the age of 74 to prevent Shingles.  Pneumonia shot- Once after the age of 63; if you are younger than 40, ask your healthcare provider if you need a Pneumonia shot.  Take these steps  Don't smoke- If you do smoke, talk to your doctor about quitting.  For tips on how to quit, go to www.smokefree.gov or call 1-800-QUIT-NOW.  Be physically active- Exercise 5 days a week for at least 30 minutes.  If you are not already physically active start slow and gradually work up to 30 minutes of moderate physical  activity.  Examples of moderate activity include walking briskly, mowing the yard, dancing, swimming, bicycling, etc.  Eat a healthy diet- Eat a variety of healthy food such as fruits, vegetables, low fat milk, low fat cheese, yogurt, lean meant, poultry, fish, beans, tofu, etc. For more information go to www.thenutritionsource.org  Drink alcohol in moderation- Limit alcohol intake to less than two drinks a day. Never drink and drive.  Dentist- Brush and floss twice daily; visit your dentist twice a year.  Depression- Your emotional health is as important as your physical health. If you're feeling down, or losing interest in things you would normally enjoy please talk to your healthcare provider.  Eye exam- Visit your eye doctor every year.  Safe sex- If you may be exposed to a sexually transmitted infection, use a condom.  Seat belts- Seat belts can save your life; always wear one.  Smoke/Carbon Monoxide detectors- These detectors need to be installed on the appropriate level of your home.  Replace batteries at least once a year.  Skin cancer- When out in the sun, cover up and use sunscreen 15 SPF or higher.  Violence- If anyone is threatening you, please tell your healthcare provider.  Living Will/ Health care power of attorney- Speak with your healthcare provider and family.

## 2015-11-21 NOTE — Progress Notes (Signed)
Subjective:    Patient ID: Dustin Jordan, male    DOB: 08-02-1946, 70 y.o.   MRN: [id number] By signing my name below, I, [name], attest that this documentation has been prepared under the direction and in the presence of [name], MD.  Electronically Signed: [name], Medical Scribe. 11/21/2015. 11:29 AM.  HPI HPI Comments: Dustin Jordan is a 70 y.o. male who presents to the [facility] for an annual exam. New patient to me. No specific concerns today. Patient is not fasting today, but agrees to come back within the next few days for blood work. PCP is [name], [name], MD but he has not had a recent visit with him since April last year. 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 [name]. Last visit February 20th with plan of repeat PET scan. Recent thrombocytopenia, so treatment was delayed. Planned for 3 more cycles of therapy, then repeat scans.   Medicare Wellness visit, subsequent Cancer screening:  Colon cancer screening - Colonoscopy: no recent colonoscopy. Last one approximately 10 years ago with Dr. [name]. He already has a referral in place with Dr. [name] and will follow-up with him. Prostate cancer screening - He had been seeing a urologist, Dr. [name], at [facility], but does not see him anymore. He believes he has had prostate cancer screening within the past year. Lab Results  Component Value Date   PSA 0.74 09/18/2014   PSA 0.59 04/25/2014   PSA 1.30 04/20/2013    Immunizations: He declines injections today, including pneumonia, influenza, tetanus, and shingles. He is aware of the risks.  Depression screening: Patient denies depression symptoms. Depression screen [facility] 2/9 11/21/2015 08/06/2015 12/27/2014  Decreased Interest 0 0 0  Down, Depressed, Hopeless 0 0 0  PHQ - 2 Score 0 0 0   Fall screening: 0 in the past year.  Vision: He states he saw his eye  doctor about a year ago.  Visual Acuity Screening   Right eye Left eye Both eyes  Without correction: 20/20 20/20 20/15   With correction:       Dentist: He has not seen his dentist recently. He has partial dentures in place.  Hearing: He denies problems with hearing.  Advanced directives: He declined information when discussed a few days ago at [facility]. He does not have a living will currently, but would like some information today.  Hypertension: He takes HCTZ. He is not on any cholesterol medications.  Patient Active Problem List   Diagnosis Date Noted  . 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 Lymphadenopathy, pelvic 01/20/2015  . Partial small bowel obstruction ([place]) 01/19/2015  . Thyroid mass 10/04/2014  . Clot retention of urine 01/01/2014  . HTN (hypertension) 04/20/2013  . Hematuria 04/20/2013   Past Medical History  Diagnosis Date  . Hypertension   . White coat hypertension   . Swelling of right knee joint     s/p arthroscopy 06-08-2013  . Bladder cancer ([name])   . Hematuria   . Frequency of urination   . 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 Knee arthroscopy Right 06-08-2013  . Cystoscopy w/ retrogrades Bilateral 06/22/2013    Procedure: CYSTOSCOPY WITH RETROGRADE PYELOGRAM;  Surgeon: Alexis Frock, MD;  Location: [facility];  Service: Urology;  Laterality:  Bilateral;  . Transurethral resection of bladder tumor with gyrus (turbt-gyrus) N/A 06/22/2013    Procedure: TRANSURETHRAL RESECTION OF BLADDER TUMOR WITH GYRUS (TURBT-GYRUS);  Surgeon: Alexis Frock, MD;  Location: [facility];  Service: Urology;  Laterality: N/A;  . Cystoscopy with retrograde pyelogram, ureteroscopy and stent placement Bilateral 01/02/2014    Procedure: CYSTOSCOPY WITH RETROGRADE PYELOGRAM BILATERAL URETERS  AND TRANSURETHRAL  RESECTION OF A BLADDER TUMOR WITH MULTIPLE BX;  Surgeon: Alexis Frock, MD;  Location: [facility];  Service: Urology;  Laterality: Bilateral;  . Robot assisted laparoscopic complete cystect ileal conduit N/A 02/17/2014    Procedure: ROBOTIC ASSISTED LAPAROSCOPIC PARTIAL CYSTECTOMY;  Surgeon: Alexis Frock, MD;  Location: [facility];  Service: Urology;  Laterality: N/A;  . Lymphadenectomy Bilateral 02/17/2014    Procedure: BILATERAL LYMPH NODE DISSECTION;  Surgeon: Alexis Frock, MD;  Location: [facility];  Service: Urology;  Laterality: Bilateral;  . Cystoscopy N/A 02/17/2014    Procedure: CYSTOSCOPY WITH INDOCYANINE GREEN DYE;  Surgeon: Alexis Frock, MD;  Location: [facility];  Service: Urology;  Laterality: N/A;   No Known Allergies Prior to Admission medications   Medication Sig Start Date End Date Taking? Authorizing Provider  dexamethasone (DECADRON) 4 MG tablet Take 2 tablets (8 mg total) by mouth 2 (two) times daily with a meal. Start the day after chemotherapy for 3 days. 02/08/15   [name], MD  hydrochlorothiazide (MICROZIDE) 12.5 MG capsule TAKE 1 CAPSULE BY MOUTH EVERY MORNING( OFFICE VISIT NEEDED FOR REFILLS) 10/30/15   Harrison Mons, PA-C  lidocaine-prilocaine (EMLA) cream Apply 1 application topically as needed. 05/15/15   [name], MD  ondansetron (ZOFRAN) 8 MG tablet Take 1 tablet (8 mg total) by mouth 2 (two) times daily. Start the day after chemo for 3 days. Then take as needed for nausea or vomiting. 02/08/15   [name], MD  prochlorperazine (COMPAZINE) 10 MG tablet Take 1 tablet (10 mg total) by mouth every 6 (six) hours as needed (Nausea or vomiting). 02/08/15   [name], MD  tamsulosin (FLOMAX) 0.4 MG CAPS capsule Take 2 capsules (0.8 mg total) by mouth daily. 05/23/15   [name], MD  Tetrahydrozoline HCl (VISINE OP) Apply 1-2 drops to eye daily as needed (redness.).    Historical Provider, MD   Social History   Social History  . Marital Status: Single     Spouse Name: N/A  . Number of Children: N/A  . Years of Education: N/A   Occupational History  . Driver      part time   Social History Main Topics  . Smoking status: Former Smoker -- 1.00 packs/day for 36 years    Types: Cigarettes    Start date: 03/06/1983    Quit date: 06/15/2010  . Smokeless tobacco: Never Used     Comment: quit 5 years ago  . Alcohol Use: No  . Drug Use: No  . Sexual Activity: Not on file   Other Topics Concern  . Not on file   Social History Narrative   Divorced.   Exercise: Yes     Review of Systems  Musculoskeletal: Positive for back pain.  All other systems reviewed and are negative. 13 point ROS reviewed on patient health survey. Negative other than listed above or in nursing note. See nursing note.     Objective:   Physical Exam  Constitutional: He is oriented to person, place, and time. He appears well-developed and well-nourished.  HENT:  Head: Normocephalic and  atraumatic.  Right Ear: External ear normal.  Left Ear: External ear normal.  Mouth/Throat: Oropharynx is clear and moist.  Eyes: Conjunctivae and EOM are normal. Pupils are equal, round, and reactive to light.  Neck: Normal range of motion. Neck supple. No thyromegaly present.  Cardiovascular: Normal rate, regular rhythm, normal heart sounds and intact distal pulses.   Pulmonary/Chest: Effort normal and breath sounds normal. No respiratory distress. He has no wheezes.  Abdominal: Soft. He exhibits no distension. There is no tenderness. Hernia confirmed negative in the right inguinal area and confirmed negative in the left inguinal area.  Genitourinary: Prostate normal.  Prostate: slight firm area centrally, but no focal nodules palpated.  Musculoskeletal: Normal range of motion. He exhibits no edema or tenderness.  Lymphadenopathy:    He has no cervical adenopathy.  Neurological: He is alert and oriented to person, place, and time. He has normal reflexes.  Skin: Skin is warm  and dry.  Psychiatric: He has a normal mood and affect. His behavior is normal.  Vitals reviewed.   Filed Vitals:   11/21/15 1052  BP: 136/74  Pulse: 68  Temp: 97.9 F (36.6 C)  TempSrc: Oral  Resp: 16  Height: 6\' 2"  (1.88 m)  Weight: 198 lb 3.2 oz (89.903 kg)  SpO2: 98%         Assessment & Plan:   MICKEAL DIRIENZO is a 70 y.o. male Medicare annual wellness visit, subsequent  --anticipatory guidance as below in AVS, screening labs above. Health maintenance items as above in HPI discussed/recommended as applicable.   -Commended follow-up with his gastroenterologist to discuss screening colonoscopy   -Follow-up with ophthalmology if last visit more than 1 year ago.  Essential hypertension - Plan: hydrochlorothiazide (MICROZIDE) 12.5 MG capsule  -Stable. Continue HCTZ 12.5 mg daily. Advised need for labs, but is not fasting, fasting labs entered for Dustin fasting lab visit in next week.   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 Denies depression, denies need for any other resources at this time.  Screening for hyperlipidemia - Plan: Lipid panel  -plan for fasting lab visit.  Encounter for screening for malignant neoplasm of prostate - Plan: PSA, Medicare  -We discussed pros and cons of prostate cancer screening, and after this discussion, he chose to have screening done. PSA obtained, discussed he may need to still  follow-up with urology in addition to oncology as above.   Meds ordered this encounter  Medications  . hydrochlorothiazide (MICROZIDE) 12.5 MG capsule    Sig: TAKE 1 CAPSULE BY MOUTH EVERY MORNING    Dispense:  90 capsule    Refill:  1   Patient Instructions  Follow up with Dr. [name] for colonoscopy - let me know if I need to refer you.   If more than a year ago - recommend visit with ophthalmology.  Return for fasting blood work in  next 1 week.   Keeping you healthy  Get these tests  Blood pressure- Have your blood pressure checked once a year by your healthcare provider.  Normal blood pressure is 120/80  Weight- Have your body mass index (BMI) calculated to screen for obesity.  BMI is a measure of body fat based on height and weight. You can also calculate your own BMI at [employer]Banking[employer]si.  Cholesterol- Have your cholesterol checked every year.  Diabetes- Have your blood sugar checked regularly if you have high  blood pressure, high cholesterol, have a family history of diabetes or if you are overweight.  Screening for Colon Cancer- Colonoscopy starting at age 61.  Screening may begin sooner depending on your family history and other health conditions. Follow up colonoscopy as directed by your Gastroenterologist.  Screening for Prostate Cancer- Both blood work (PSA) and a rectal exam help screen for Prostate Cancer.  Screening begins at age 77 with African-American men and at age 82 with Caucasian men.  Screening may begin sooner depending on your family history.  Take these medicines  Aspirin- One aspirin daily can help prevent Heart disease and Stroke.  Flu shot- Every fall.  Tetanus- Every 10 years.  Zostavax- Once after the age of 41 to prevent Shingles.  Pneumonia shot- Once after the age of 16; if you are younger than 88, ask your healthcare provider if you need a Pneumonia shot.  Take these steps  Don't smoke- If you do smoke, talk to your doctor about quitting.  For tips on how to quit, go to www.smokefree.gov or call 1-800-QUIT-NOW.  Be physically active- Exercise 5 days a week for at least 30 minutes.  If you are not already physically active start slow and gradually work up to 30 minutes of moderate physical activity.  Examples of moderate activity include walking briskly, mowing the yard, dancing, swimming, bicycling, etc.  Eat a healthy diet- Eat a variety of healthy food such as fruits,  vegetables, low fat milk, low fat cheese, yogurt, lean meant, poultry, fish, beans, tofu, etc. For more information go to www.thenutritionsource.org  Drink alcohol in moderation- Limit alcohol intake to less than two drinks a day. Never drink and drive.  Dentist- Brush and floss twice daily; visit your dentist twice a year.  Depression- Your emotional health is as important as your physical health. If you're feeling down, or losing interest in things you would normally enjoy please talk to your healthcare provider.  Eye exam- Visit your eye doctor every year.  Safe sex- If you may be exposed to a sexually transmitted infection, use a condom.  Seat belts- Seat belts can save your life; always wear one.  Smoke/Carbon Monoxide detectors- These detectors need to be installed on the appropriate level of your home.  Replace batteries at least once a year.  Skin cancer- When out in the sun, cover up and use sunscreen 15 SPF or higher.  Violence- If anyone is threatening you, please tell your healthcare provider.  Living Will/ Health care power of attorney- Speak with your healthcare provider and family.    I personally performed the services described in this documentation, which was scribed in my presence. The recorded information has been reviewed and considered, and addended by me as needed.

## 2015-11-21 NOTE — Progress Notes (Signed)
   Subjective:    Patient ID: Dustin Jordan, male    DOB: 11-Apr-1946, 70 y.o.   MRN: [id number]  HPI    Review of Systems  Constitutional: Negative.   HENT: Negative.   Eyes: Negative.   Respiratory: Negative.   Cardiovascular: Negative.   Gastrointestinal: Negative.   Endocrine: Negative.   Genitourinary: Negative.   Musculoskeletal: Positive for back pain.  Skin: Negative.   Allergic/Immunologic: Negative.   Neurological: Negative.   Hematological: Negative.   Psychiatric/Behavioral: Negative.        Objective:   Physical Exam        Assessment & Plan:

## 2015-11-26 ENCOUNTER — Other Ambulatory Visit (HOSPITAL_BASED_OUTPATIENT_CLINIC_OR_DEPARTMENT_OTHER): Payer: Medicare Other

## 2015-11-26 ENCOUNTER — Ambulatory Visit (HOSPITAL_BASED_OUTPATIENT_CLINIC_OR_DEPARTMENT_OTHER): Payer: Medicare Other | Admitting: Hematology & Oncology

## 2015-11-26 ENCOUNTER — Ambulatory Visit (HOSPITAL_BASED_OUTPATIENT_CLINIC_OR_DEPARTMENT_OTHER): Payer: Medicare Other

## 2015-11-26 ENCOUNTER — Encounter: Payer: Self-pay | Admitting: Hematology & Oncology

## 2015-11-26 VITALS — BP 149/81 | HR 66 | Temp 97.6°F | Resp 16 | Ht 74.0 in | Wt 201.0 lb

## 2015-11-26 DIAGNOSIS — C679 Malignant neoplasm of bladder, unspecified: Secondary | ICD-10-CM

## 2015-11-26 DIAGNOSIS — C775 Secondary and unspecified malignant neoplasm of intrapelvic lymph nodes: Principal | ICD-10-CM

## 2015-11-26 DIAGNOSIS — C7951 Secondary malignant neoplasm of bone: Secondary | ICD-10-CM | POA: Diagnosis not present

## 2015-11-26 DIAGNOSIS — Z5111 Encounter for antineoplastic chemotherapy: Secondary | ICD-10-CM

## 2015-11-26 LAB — CBC WITH DIFFERENTIAL (CANCER CENTER ONLY)
BASO#: 0 10*3/uL (ref 0.0–0.2)
BASO%: 0.4 % (ref 0.0–2.0)
EOS ABS: 0 10*3/uL (ref 0.0–0.5)
EOS%: 0.8 % (ref 0.0–7.0)
HCT: 29.8 % — ABNORMAL LOW (ref 38.7–49.9)
HGB: 9.9 g/dL — ABNORMAL LOW (ref 13.0–17.1)
LYMPH#: 1 10*3/uL (ref 0.9–3.3)
LYMPH%: 39 % (ref 14.0–48.0)
MCH: 34.4 pg — AB (ref 28.0–33.4)
MCHC: 33.2 g/dL (ref 32.0–35.9)
MCV: 104 fL — AB (ref 82–98)
MONO#: 0.6 10*3/uL (ref 0.1–0.9)
MONO%: 24 % — ABNORMAL HIGH (ref 0.0–13.0)
NEUT#: 0.9 10*3/uL — ABNORMAL LOW (ref 1.5–6.5)
NEUT%: 35.8 % — AB (ref 40.0–80.0)
PLATELETS: 122 10*3/uL — AB (ref 145–400)
RBC: 2.88 10*6/uL — AB (ref 4.20–5.70)
RDW: 16.2 % — ABNORMAL HIGH (ref 11.1–15.7)
WBC: 2.5 10*3/uL — AB (ref 4.0–10.0)

## 2015-11-26 LAB — CMP (CANCER CENTER ONLY)
ALT(SGPT): 18 U/L (ref 10–47)
AST: 30 U/L (ref 11–38)
Albumin: 3.3 g/dL (ref 3.3–5.5)
Alkaline Phosphatase: 38 U/L (ref 26–84)
BUN: 15 mg/dL (ref 7–22)
CO2: 25 meq/L (ref 18–33)
Calcium: 9.3 mg/dL (ref 8.0–10.3)
Chloride: 109 mEq/L — ABNORMAL HIGH (ref 98–108)
Creat: 1.5 mg/dl — ABNORMAL HIGH (ref 0.6–1.2)
Glucose, Bld: 90 mg/dL (ref 73–118)
POTASSIUM: 3.6 meq/L (ref 3.3–4.7)
Sodium: 139 mEq/L (ref 128–145)
TOTAL PROTEIN: 7.4 g/dL (ref 6.4–8.1)
Total Bilirubin: 0.7 mg/dl (ref 0.20–1.60)

## 2015-11-26 MED ORDER — SODIUM CHLORIDE 0.9 % IV SOLN
441.5000 mg | Freq: Once | INTRAVENOUS | Status: AC
  Administered 2015-11-26: 440 mg via INTRAVENOUS
  Filled 2015-11-26: qty 44

## 2015-11-26 MED ORDER — HEPARIN SOD (PORK) LOCK FLUSH 100 UNIT/ML IV SOLN
250.0000 [IU] | Freq: Once | INTRAVENOUS | Status: DC | PRN
  Filled 2015-11-26: qty 5

## 2015-11-26 MED ORDER — ZOLEDRONIC ACID 4 MG/100ML IV SOLN
4.0000 mg | Freq: Once | INTRAVENOUS | Status: AC
  Administered 2015-11-26: 4 mg via INTRAVENOUS
  Filled 2015-11-26: qty 100

## 2015-11-26 MED ORDER — SODIUM CHLORIDE 0.9 % IV SOLN: Freq: Once | INTRAVENOUS | Status: DC

## 2015-11-26 MED ORDER — CARBOPLATIN CHEMO INTRADERMAL TEST DOSE 100MCG/0.02ML
100.0000 ug | Freq: Once | INTRADERMAL | Status: AC
  Administered 2015-11-26: 100 ug via INTRADERMAL
  Filled 2015-11-26: qty 0.02

## 2015-11-26 MED ORDER — COLD PACK MISC ONCOLOGY
1.0000 | Freq: Once | Status: DC | PRN
  Filled 2015-11-26: qty 1

## 2015-11-26 MED ORDER — SODIUM CHLORIDE 0.9 % IV SOLN
Freq: Once | INTRAVENOUS | Status: AC
  Administered 2015-11-26: 10:00:00 via INTRAVENOUS
  Filled 2015-11-26: qty 8

## 2015-11-26 MED ORDER — SODIUM CHLORIDE 0.9 % IJ SOLN
10.0000 mL | INTRAMUSCULAR | Status: DC | PRN
  Administered 2015-11-26: 10 mL
  Filled 2015-11-26: qty 10

## 2015-11-26 MED ORDER — SODIUM CHLORIDE 0.9 % IV SOLN
Freq: Once | INTRAVENOUS | Status: AC
  Administered 2015-11-26: 10:00:00 via INTRAVENOUS

## 2015-11-26 MED ORDER — SODIUM CHLORIDE 0.9 % IV SOLN
640.0000 mg/m2 | Freq: Once | INTRAVENOUS | Status: AC
  Administered 2015-11-26: 1330 mg via INTRAVENOUS
  Filled 2015-11-26: qty 10.52

## 2015-11-26 MED ORDER — ALTEPLASE 2 MG IJ SOLR
2.0000 mg | Freq: Once | INTRAMUSCULAR | Status: DC | PRN
  Filled 2015-11-26: qty 2

## 2015-11-26 MED ORDER — SODIUM CHLORIDE 0.9 % IJ SOLN
3.0000 mL | INTRAMUSCULAR | Status: DC | PRN
  Filled 2015-11-26: qty 10

## 2015-11-26 MED ORDER — HEPARIN SOD (PORK) LOCK FLUSH 100 UNIT/ML IV SOLN
500.0000 [IU] | Freq: Once | INTRAVENOUS | Status: AC | PRN
  Administered 2015-11-26: 500 [IU]
  Filled 2015-11-26: qty 5

## 2015-11-26 NOTE — Progress Notes (Signed)
Hematology and Oncology Follow Up Visit  Dustin Jordan [id number] [birth year] 70 y.o. 11/26/2015   Principle Diagnosis:  Metastatic bladder cancer  Current Therapy:   Carboplatin/Gemzar s/p cycle 13 Zometa 4 mg IV every 3 weeks    Interim History:  Dustin Jordan is here today for a follow-up. He is doing okay. He had no problems over the weekend   We held his treatment last week because of his low platelet count. I have decided to just do day 1 of treatment from here on out and not day 8 gemcitabine. I think they 1 with carboplatin/gemcitabine will be effective.  He's had no problems with bleeding. He's had no issues with bowels or bladder. He's had no nausea or vomiting.  He's had no leg swelling.  Overall, his performance status is ECOG 1.  Medications:    Medication List       This list is accurate as of: 11/26/15  8:52 AM.  Always use your most recent med list.               hydrochlorothiazide 12.5 MG capsule  Commonly known as:  MICROZIDE  TAKE 1 CAPSULE BY MOUTH EVERY MORNING     lidocaine-prilocaine cream  Commonly known as:  EMLA  Apply 1 application topically as needed.     tamsulosin 0.4 MG Caps capsule  Commonly known as:  FLOMAX  Take 2 capsules (0.8 mg total) by mouth daily.     VISINE OP  Apply 1-2 drops to eye daily as needed (redness.). Reported on 11/21/2015        Allergies: No Known Allergies  Past Medical History, Surgical history, Social history, and Family History were reviewed and updated.  Review of Systems: All other 10 point review of systems is negative.   Physical Exam:  height is 6\' 2"  (1.88 m) and weight is 201 lb (91.173 kg). His oral temperature is 97.6 F (36.4 C). His blood pressure is 149/81 and his pulse is 66. His respiration is 16.   Wt Readings from Last 3 Encounters:  11/26/15 201 lb (91.173 kg)  11/21/15 198 lb 3.2 oz (89.903 kg)  10/30/15 194 lb (87.998 kg)    Well-developed well-nourished Afro-American male  in no obvious distress. Head and neck exam shows no ocular or oral lesion. He has no palpable cervical or supraclavicular lymph nodes. Lungs are clear. Cardiac exam regular rate and rhythm with no murmurs, rubs or bruits. Abdomen is soft. He has good bowel sounds. There is no fluid wave. There is no palpable liver or spleen tip. Back exam shows no tenderness over the spine, ribs or hips. X-ray shows no clubbing, cyanosis or edema. Neurological exam shows no focal neurological deficits. Skin exam shows no rashes, ecchymoses or petechia.  Lab Results  Component Value Date   WBC 2.5* 11/26/2015   HGB 9.9* 11/26/2015   HCT 29.8* 11/26/2015   MCV 104* 11/26/2015   PLT 122* 11/26/2015   No results found for: FERRITIN, IRON, TIBC, UIBC, IRONPCTSAT Lab Results  Component Value Date   RBC 2.88* 11/26/2015   No results found for: KPAFRELGTCHN, LAMBDASER, KAPLAMBRATIO No results found for: IGGSERUM, IGA, IGMSERUM No results found for: TOTALPROTELP, ALBUMINELP, A1GS, A2GS, Violet Baldy, MSPIKE, SPEI   Chemistry      Component Value Date/Time   NA 139 11/19/2015 1045   NA 138 01/19/2015 1210   K 3.7 11/19/2015 1045   K 3.9 01/19/2015 1210   CL 107 11/19/2015 1045  CL 102 01/19/2015 1210   CO2 27 11/19/2015 1045   CO2 27 01/19/2015 1210   BUN 19 11/19/2015 1045   BUN 14 01/19/2015 1210   CREATININE 1.3* 11/19/2015 1045   CREATININE 1.14 01/19/2015 1210      Component Value Date/Time   CALCIUM 9.6 11/19/2015 1045   CALCIUM 9.8 01/19/2015 1210   ALKPHOS 39 11/06/2015 0858   ALKPHOS 81 09/18/2014 1433   AST 37 11/06/2015 0858   AST 26 09/18/2014 1433   ALT 27 11/06/2015 0858   ALT 21 09/18/2014 1433   BILITOT 0.60 11/06/2015 0858   BILITOT 0.4 09/18/2014 1433     Impression and Plan: Dustin Jordan is very pleasant 70 yo African American male with poorly differentiated metastatic bladder cancer.   I going to him today. His white cell count is a little bit on the lower side bounty  this will cause him to be having any problems.  I think that doing 1 treatment every 3 weeks will be effective.  We will plan to get him back in 3 weeks.   [name], MD 2/27/20178:52 AM

## 2015-11-26 NOTE — Patient Instructions (Signed)
Zoledronic Acid injection (Hypercalcemia, Oncology)  What is this medicine?  ZOLEDRONIC ACID (ZOE le dron ik AS id) lowers the amount of calcium loss from bone. It is used to treat too much calcium in your blood from cancer. It is also used to prevent complications of cancer that has spread to the bone.  This medicine may be used for other purposes; ask your health care provider or pharmacist if you have questions.  What should I tell my health care provider before I take this medicine?  They need to know if you have any of these conditions:  -aspirin-sensitive asthma  -cancer, especially if you are receiving medicines used to treat cancer  -dental disease or wear dentures  -infection  -kidney disease  -receiving corticosteroids like dexamethasone or prednisone  -an unusual or allergic reaction to zoledronic acid, other medicines, foods, dyes, or preservatives  -pregnant or trying to get pregnant  -breast-feeding  How should I use this medicine?  This medicine is for infusion into a vein. It is given by a health care professional in a hospital or clinic setting.  Talk to your pediatrician regarding the use of this medicine in children. Special care may be needed.  Overdosage: If you think you have taken too much of this medicine contact a poison control center or emergency room at once.  NOTE: This medicine is only for you. Do not share this medicine with others.  What if I miss a dose?  It is important not to miss your dose. Call your doctor or health care professional if you are unable to keep an appointment.  What may interact with this medicine?  -certain antibiotics given by injection  -NSAIDs, medicines for pain and inflammation, like ibuprofen or naproxen  -some diuretics like bumetanide, furosemide  -teriparatide  -thalidomide  This list may not describe all possible interactions. Give your health care provider a list of all the medicines, herbs, non-prescription drugs, or dietary supplements you use. Also  tell them if you smoke, drink alcohol, or use illegal drugs. Some items may interact with your medicine.  What should I watch for while using this medicine?  Visit your doctor or health care professional for regular checkups. It may be some time before you see the benefit from this medicine. Do not stop taking your medicine unless your doctor tells you to. Your doctor may order blood tests or other tests to see how you are doing.  Women should inform their doctor if they wish to become pregnant or think they might be pregnant. There is a potential for serious side effects to an unborn child. Talk to your health care professional or pharmacist for more information.  You should make sure that you get enough calcium and vitamin D while you are taking this medicine. Discuss the foods you eat and the vitamins you take with your health care professional.  Some people who take this medicine have severe bone, joint, and/or muscle pain. This medicine may also increase your risk for jaw problems or a broken thigh bone. Tell your doctor right away if you have severe pain in your jaw, bones, joints, or muscles. Tell your doctor if you have any pain that does not go away or that gets worse.  Tell your dentist and dental surgeon that you are taking this medicine. You should not have major dental surgery while on this medicine. See your dentist to have a dental exam and fix any dental problems before starting this medicine. Take good care   your doctor or health care professional as soon as possible: -allergic reactions like skin rash, itching or hives, swelling of the face, lips, or tongue -anxiety, confusion, or depression -breathing problems -changes in vision -eye pain -feeling faint or lightheaded, falls -jaw pain,  especially after dental work -mouth sores -muscle cramps, stiffness, or weakness -redness, blistering, peeling or loosening of the skin, including inside the mouth -trouble passing urine or change in the amount of urine Side effects that usually do not require medical attention (report to your doctor or health care professional if they continue or are bothersome): -bone, joint, or muscle pain -constipation -diarrhea -fever -hair loss -irritation at site where injected -loss of appetite -nausea, vomiting -stomach upset -trouble sleeping -trouble swallowing -weak or tired This list may not describe all possible side effects. Call your doctor for medical advice about side effects. You may report side effects to FDA at 1-800-FDA-1088. Where should I keep my medicine? This drug is given in a hospital or clinic and will not be stored at home. NOTE: This sheet is a summary. It may not cover all possible information. If you have questions about this medicine, talk to your doctor, pharmacist, or health care provider.    2016, Elsevier/Gold Standard. (2014-02-11 14:19:39) Gemcitabine injection What is this medicine? GEMCITABINE (jem SIT a been) is a chemotherapy drug. This medicine is used to treat many types of cancer like breast cancer, lung cancer, pancreatic cancer, and ovarian cancer. This medicine may be used for other purposes; ask your health care provider or pharmacist if you have questions. What should I tell my health care provider before I take this medicine? They need to know if you have any of these conditions: -blood disorders -infection -kidney disease -liver disease -recent or ongoing radiation therapy -an unusual or allergic reaction to gemcitabine, other chemotherapy, other medicines, foods, dyes, or preservatives -pregnant or trying to get pregnant -breast-feeding How should I use this medicine? This drug is given as an infusion into a vein. It is administered in a  hospital or clinic by a specially trained health care professional. Talk to your pediatrician regarding the use of this medicine in children. Special care may be needed. Overdosage: If you think you have taken too much of this medicine contact a poison control center or emergency room at once. NOTE: This medicine is only for you. Do not share this medicine with others. What if I miss a dose? It is important not to miss your dose. Call your doctor or health care professional if you are unable to keep an appointment. What may interact with this medicine? -medicines to increase blood counts like filgrastim, pegfilgrastim, sargramostim -some other chemotherapy drugs like cisplatin -vaccines Talk to your doctor or health care professional before taking any of these medicines: -acetaminophen -aspirin -ibuprofen -ketoprofen -naproxen This list may not describe all possible interactions. Give your health care provider a list of all the medicines, herbs, non-prescription drugs, or dietary supplements you use. Also tell them if you smoke, drink alcohol, or use illegal drugs. Some items may interact with your medicine. What should I watch for while using this medicine? Visit your doctor for checks on your progress. This drug may make you feel generally unwell. This is not uncommon, as chemotherapy can affect healthy cells as well as cancer cells. Report any side effects. Continue your course of treatment even though you feel ill unless your doctor tells you to stop. In some cases, you may be given additional medicines to help  with side effects. Follow all directions for their use. Call your doctor or health care professional for advice if you get a fever, chills or sore throat, or other symptoms of a cold or flu. Do not treat yourself. This drug decreases your body's ability to fight infections. Try to avoid being around people who are sick. This medicine may increase your risk to bruise or bleed. Call  your doctor or health care professional if you notice any unusual bleeding. Be careful brushing and flossing your teeth or using a toothpick because you may get an infection or bleed more easily. If you have any dental work done, tell your dentist you are receiving this medicine. Avoid taking products that contain aspirin, acetaminophen, ibuprofen, naproxen, or ketoprofen unless instructed by your doctor. These medicines may hide a fever. Women should inform their doctor if they wish to become pregnant or think they might be pregnant. There is a potential for serious side effects to an unborn child. Talk to your health care professional or pharmacist for more information. Do not breast-feed an infant while taking this medicine. What side effects may I notice from receiving this medicine? Side effects that you should report to your doctor or health care professional as soon as possible: -allergic reactions like skin rash, itching or hives, swelling of the face, lips, or tongue -low blood counts - this medicine may decrease the number of white blood cells, red blood cells and platelets. You may be at increased risk for infections and bleeding. -signs of infection - fever or chills, cough, sore throat, pain or difficulty passing urine -signs of decreased platelets or bleeding - bruising, pinpoint red spots on the skin, black, tarry stools, blood in the urine -signs of decreased red blood cells - unusually weak or tired, fainting spells, lightheadedness -breathing problems -chest pain -mouth sores -nausea and vomiting -pain, swelling, redness at site where injected -pain, tingling, numbness in the hands or feet -stomach pain -swelling of ankles, feet, hands -unusual bleeding Side effects that usually do not require medical attention (report to your doctor or health care professional if they continue or are bothersome): -constipation -diarrhea -hair loss -loss of appetite -stomach upset This  list may not describe all possible side effects. Call your doctor for medical advice about side effects. You may report side effects to FDA at 1-800-FDA-1088. Where should I keep my medicine? This drug is given in a hospital or clinic and will not be stored at home. NOTE: This sheet is a summary. It may not cover all possible information. If you have questions about this medicine, talk to your doctor, pharmacist, or health care provider.    2016, Elsevier/Gold Standard. (2008-01-25 18:45:54) Carboplatin injection What is this medicine? CARBOPLATIN (KAR boe pla tin) is a chemotherapy drug. It targets fast dividing cells, like cancer cells, and causes these cells to die. This medicine is used to treat ovarian cancer and many other cancers. This medicine may be used for other purposes; ask your health care provider or pharmacist if you have questions. What should I tell my health care provider before I take this medicine? They need to know if you have any of these conditions: -blood disorders -hearing problems -kidney disease -recent or ongoing radiation therapy -an unusual or allergic reaction to carboplatin, cisplatin, other chemotherapy, other medicines, foods, dyes, or preservatives -pregnant or trying to get pregnant -breast-feeding How should I use this medicine? This drug is usually given as an infusion into a vein. It is administered in  a hospital or clinic by a specially trained health care professional. Talk to your pediatrician regarding the use of this medicine in children. Special care may be needed. Overdosage: If you think you have taken too much of this medicine contact a poison control center or emergency room at once. NOTE: This medicine is only for you. Do not share this medicine with others. What if I miss a dose? It is important not to miss a dose. Call your doctor or health care professional if you are unable to keep an appointment. What may interact with this  medicine? -medicines for seizures -medicines to increase blood counts like filgrastim, pegfilgrastim, sargramostim -some antibiotics like amikacin, gentamicin, neomycin, streptomycin, tobramycin -vaccines Talk to your doctor or health care professional before taking any of these medicines: -acetaminophen -aspirin -ibuprofen -ketoprofen -naproxen This list may not describe all possible interactions. Give your health care provider a list of all the medicines, herbs, non-prescription drugs, or dietary supplements you use. Also tell them if you smoke, drink alcohol, or use illegal drugs. Some items may interact with your medicine. What should I watch for while using this medicine? Your condition will be monitored carefully while you are receiving this medicine. You will need important blood work done while you are taking this medicine. This drug may make you feel generally unwell. This is not uncommon, as chemotherapy can affect healthy cells as well as cancer cells. Report any side effects. Continue your course of treatment even though you feel ill unless your doctor tells you to stop. In some cases, you may be given additional medicines to help with side effects. Follow all directions for their use. Call your doctor or health care professional for advice if you get a fever, chills or sore throat, or other symptoms of a cold or flu. Do not treat yourself. This drug decreases your body's ability to fight infections. Try to avoid being around people who are sick. This medicine may increase your risk to bruise or bleed. Call your doctor or health care professional if you notice any unusual bleeding. Be careful brushing and flossing your teeth or using a toothpick because you may get an infection or bleed more easily. If you have any dental work done, tell your dentist you are receiving this medicine. Avoid taking products that contain aspirin, acetaminophen, ibuprofen, naproxen, or ketoprofen unless  instructed by your doctor. These medicines may hide a fever. Do not become pregnant while taking this medicine. Women should inform their doctor if they wish to become pregnant or think they might be pregnant. There is a potential for serious side effects to an unborn child. Talk to your health care professional or pharmacist for more information. Do not breast-feed an infant while taking this medicine. What side effects may I notice from receiving this medicine? Side effects that you should report to your doctor or health care professional as soon as possible: -allergic reactions like skin rash, itching or hives, swelling of the face, lips, or tongue -signs of infection - fever or chills, cough, sore throat, pain or difficulty passing urine -signs of decreased platelets or bleeding - bruising, pinpoint red spots on the skin, black, tarry stools, nosebleeds -signs of decreased red blood cells - unusually weak or tired, fainting spells, lightheadedness -breathing problems -changes in hearing -changes in vision -chest pain -high blood pressure -low blood counts - This drug may decrease the number of white blood cells, red blood cells and platelets. You may be at increased risk for infections  and bleeding. -nausea and vomiting -pain, swelling, redness or irritation at the injection site -pain, tingling, numbness in the hands or feet -problems with balance, talking, walking -trouble passing urine or change in the amount of urine Side effects that usually do not require medical attention (report to your doctor or health care professional if they continue or are bothersome): -hair loss -loss of appetite -metallic taste in the mouth or changes in taste This list may not describe all possible side effects. Call your doctor for medical advice about side effects. You may report side effects to FDA at 1-800-FDA-1088. Where should I keep my medicine? This drug is given in a hospital or clinic and will  not be stored at home. NOTE: This sheet is a summary. It may not cover all possible information. If you have questions about this medicine, talk to your doctor, pharmacist, or health care provider.    2016, [employer] Standard. (2007-12-21 14:38:05)

## 2015-11-27 ENCOUNTER — Telehealth: Payer: Self-pay | Admitting: Family Medicine

## 2015-11-27 ENCOUNTER — Other Ambulatory Visit: Payer: Self-pay | Admitting: Family Medicine

## 2015-11-27 ENCOUNTER — Other Ambulatory Visit (INDEPENDENT_AMBULATORY_CARE_PROVIDER_SITE_OTHER): Payer: Medicare Other

## 2015-11-27 DIAGNOSIS — Z131 Encounter for screening for diabetes mellitus: Secondary | ICD-10-CM | POA: Diagnosis not present

## 2015-11-27 DIAGNOSIS — Z0000 Encounter for general adult medical examination without abnormal findings: Secondary | ICD-10-CM

## 2015-11-27 DIAGNOSIS — Z13220 Encounter for screening for lipoid disorders: Secondary | ICD-10-CM

## 2015-11-27 DIAGNOSIS — Z114 Encounter for screening for human immunodeficiency virus [HIV]: Secondary | ICD-10-CM

## 2015-11-27 DIAGNOSIS — Z125 Encounter for screening for malignant neoplasm of prostate: Secondary | ICD-10-CM

## 2015-11-27 LAB — COMPLETE METABOLIC PANEL WITH GFR
ALK PHOS: 35 U/L — AB (ref 40–115)
ALT: 11 U/L (ref 9–46)
AST: 19 U/L (ref 10–35)
Albumin: 3.6 g/dL (ref 3.6–5.1)
BILIRUBIN TOTAL: 0.5 mg/dL (ref 0.2–1.2)
BUN: 20 mg/dL (ref 7–25)
CALCIUM: 8.9 mg/dL (ref 8.6–10.3)
CO2: 24 mmol/L (ref 20–31)
CREATININE: 1.29 mg/dL — AB (ref 0.70–1.25)
Chloride: 109 mmol/L (ref 98–110)
GFR, EST NON AFRICAN AMERICAN: 56 mL/min — AB (ref >=60)
GFR, Est African American: 65 mL/min (ref >=60)
Glucose, Bld: 98 mg/dL (ref 65–99)
Potassium: 4.8 mmol/L (ref 3.5–5.3)
Sodium: 140 mmol/L (ref 135–146)
TOTAL PROTEIN: 7.6 g/dL (ref 6.1–8.1)

## 2015-11-27 LAB — LIPID PANEL
CHOLESTEROL: 171 mg/dL (ref 125–200)
HDL: 71 mg/dL (ref >=40)
LDL Cholesterol: 93 mg/dL (ref <130)
TRIGLYCERIDES: 33 mg/dL (ref <150)
Total CHOL/HDL Ratio: 2.4 Ratio (ref <=5.0)
VLDL: 7 mg/dL (ref <30)

## 2015-11-27 NOTE — Telephone Encounter (Signed)
Ordered. Melonie [place] that future order placed.

## 2015-11-27 NOTE — Telephone Encounter (Signed)
Pt came in this morning for his labs.   He called back at 9:20 and requested that an HIV test be added to his labs.  Will forward to Dr. Carlota Raspberry to have this approved.  Dr. Carlota Raspberry, please advise. thanks

## 2015-11-28 LAB — HIV ANTIBODY (ROUTINE TESTING W REFLEX): HIV: NONREACTIVE

## 2015-11-28 LAB — PSA, MEDICARE: PSA: 0.58 ng/mL (ref <=4.00)

## 2015-12-10 ENCOUNTER — Ambulatory Visit: Payer: Medicare Other

## 2015-12-10 ENCOUNTER — Other Ambulatory Visit: Payer: Medicare Other

## 2015-12-17 ENCOUNTER — Other Ambulatory Visit (HOSPITAL_BASED_OUTPATIENT_CLINIC_OR_DEPARTMENT_OTHER): Payer: Medicare Other

## 2015-12-17 ENCOUNTER — Ambulatory Visit (HOSPITAL_BASED_OUTPATIENT_CLINIC_OR_DEPARTMENT_OTHER): Payer: Medicare Other | Admitting: Hematology & Oncology

## 2015-12-17 ENCOUNTER — Ambulatory Visit (HOSPITAL_BASED_OUTPATIENT_CLINIC_OR_DEPARTMENT_OTHER): Payer: Medicare Other

## 2015-12-17 ENCOUNTER — Encounter: Payer: Self-pay | Admitting: Hematology & Oncology

## 2015-12-17 VITALS — BP 137/91 | HR 75 | Temp 97.7°F | Wt 195.1 lb

## 2015-12-17 DIAGNOSIS — C779 Secondary and unspecified malignant neoplasm of lymph node, unspecified: Secondary | ICD-10-CM

## 2015-12-17 DIAGNOSIS — C775 Secondary and unspecified malignant neoplasm of intrapelvic lymph nodes: Principal | ICD-10-CM

## 2015-12-17 DIAGNOSIS — C7951 Secondary malignant neoplasm of bone: Secondary | ICD-10-CM

## 2015-12-17 DIAGNOSIS — C679 Malignant neoplasm of bladder, unspecified: Secondary | ICD-10-CM | POA: Diagnosis not present

## 2015-12-17 DIAGNOSIS — Z5111 Encounter for antineoplastic chemotherapy: Secondary | ICD-10-CM

## 2015-12-17 LAB — CMP (CANCER CENTER ONLY)
ALBUMIN: 3.6 g/dL (ref 3.3–5.5)
ALT(SGPT): 25 U/L (ref 10–47)
AST: 33 U/L (ref 11–38)
Alkaline Phosphatase: 35 U/L (ref 26–84)
BUN, Bld: 22 mg/dL (ref 7–22)
CALCIUM: 9.4 mg/dL (ref 8.0–10.3)
CHLORIDE: 104 meq/L (ref 98–108)
CO2: 30 meq/L (ref 18–33)
Creat: 1.5 mg/dl — ABNORMAL HIGH (ref 0.6–1.2)
GLUCOSE: 90 mg/dL (ref 73–118)
POTASSIUM: 4 meq/L (ref 3.3–4.7)
Sodium: 140 mEq/L (ref 128–145)
Total Bilirubin: 0.8 mg/dl (ref 0.20–1.60)
Total Protein: 8.3 g/dL — ABNORMAL HIGH (ref 6.4–8.1)

## 2015-12-17 LAB — CBC WITH DIFFERENTIAL (CANCER CENTER ONLY)
BASO#: 0 10*3/uL (ref 0.0–0.2)
BASO%: 0.3 % (ref 0.0–2.0)
EOS ABS: 0.1 10*3/uL (ref 0.0–0.5)
EOS%: 1.4 % (ref 0.0–7.0)
HEMATOCRIT: 35.4 % — AB (ref 38.7–49.9)
HEMOGLOBIN: 12 g/dL — AB (ref 13.0–17.1)
LYMPH#: 1 10*3/uL (ref 0.9–3.3)
LYMPH%: 26.4 % (ref 14.0–48.0)
MCH: 34.7 pg — AB (ref 28.0–33.4)
MCHC: 33.9 g/dL (ref 32.0–35.9)
MCV: 102 fL — ABNORMAL HIGH (ref 82–98)
MONO#: 0.9 10*3/uL (ref 0.1–0.9)
MONO%: 24.5 % — AB (ref 0.0–13.0)
NEUT%: 47.4 % (ref 40.0–80.0)
NEUTROS ABS: 1.8 10*3/uL (ref 1.5–6.5)
Platelets: 150 10*3/uL (ref 145–400)
RBC: 3.46 10*6/uL — ABNORMAL LOW (ref 4.20–5.70)
RDW: 16.2 % — AB (ref 11.1–15.7)
WBC: 3.7 10*3/uL — ABNORMAL LOW (ref 4.0–10.0)

## 2015-12-17 MED ORDER — SODIUM CHLORIDE 0.9 % IV SOLN
Freq: Once | INTRAVENOUS | Status: AC
  Administered 2015-12-17: 11:00:00 via INTRAVENOUS

## 2015-12-17 MED ORDER — SODIUM CHLORIDE 0.9 % IV SOLN
640.0000 mg/m2 | Freq: Once | INTRAVENOUS | Status: AC
  Administered 2015-12-17: 1330 mg via INTRAVENOUS
  Filled 2015-12-17: qty 29.72

## 2015-12-17 MED ORDER — ZOLEDRONIC ACID 4 MG/100ML IV SOLN
4.0000 mg | Freq: Once | INTRAVENOUS | Status: AC
  Administered 2015-12-17: 4 mg via INTRAVENOUS
  Filled 2015-12-17: qty 100

## 2015-12-17 MED ORDER — SODIUM CHLORIDE 0.9 % IV SOLN
441.5000 mg | Freq: Once | INTRAVENOUS | Status: AC
  Administered 2015-12-17: 440 mg via INTRAVENOUS
  Filled 2015-12-17: qty 44

## 2015-12-17 MED ORDER — CARBOPLATIN CHEMO INTRADERMAL TEST DOSE 100MCG/0.02ML
100.0000 ug | Freq: Once | INTRADERMAL | Status: AC
  Administered 2015-12-17: 100 ug via INTRADERMAL
  Filled 2015-12-17: qty 0.02

## 2015-12-17 MED ORDER — SODIUM CHLORIDE 0.9 % IJ SOLN
10.0000 mL | INTRAMUSCULAR | Status: DC | PRN
  Administered 2015-12-17: 10 mL
  Filled 2015-12-17: qty 10

## 2015-12-17 MED ORDER — SODIUM CHLORIDE 0.9 % IV SOLN
Freq: Once | INTRAVENOUS | Status: AC
  Administered 2015-12-17: 12:00:00 via INTRAVENOUS
  Filled 2015-12-17: qty 8

## 2015-12-17 MED ORDER — HEPARIN SOD (PORK) LOCK FLUSH 100 UNIT/ML IV SOLN
500.0000 [IU] | Freq: Once | INTRAVENOUS | Status: AC | PRN
  Administered 2015-12-17: 500 [IU]
  Filled 2015-12-17: qty 5

## 2015-12-17 NOTE — Patient Instructions (Signed)
[facility] Discharge Instructions for Patients Receiving Chemotherapy  Today you received the following chemotherapy agents Carboplatin, Gemzar  To help prevent nausea and vomiting after your treatment, we encourage you to take your nausea medication    If you develop nausea and vomiting that is not controlled by your nausea medication, call the clinic.   BELOW ARE SYMPTOMS THAT SHOULD BE REPORTED IMMEDIATELY:  *FEVER GREATER THAN 100.5 F  *CHILLS WITH OR WITHOUT FEVER  NAUSEA AND VOMITING THAT IS NOT CONTROLLED WITH YOUR NAUSEA MEDICATION  *UNUSUAL SHORTNESS OF BREATH  *UNUSUAL BRUISING OR BLEEDING  TENDERNESS IN MOUTH AND THROAT WITH OR WITHOUT PRESENCE OF ULCERS  *URINARY PROBLEMS  *BOWEL PROBLEMS  UNUSUAL RASH Items with * indicate a potential emergency and should be followed up as soon as possible.  Feel free to call the clinic you have any questions or concerns. The clinic phone number is [phone].  Please show the [facility] at check-in to the Emergency Department and triage nurse.  Zoledronic Acid injection (Hypercalcemia, Oncology) What is this medicine? ZOLEDRONIC ACID (ZOE le dron ik AS id) lowers the amount of calcium loss from bone. It is used to treat too much calcium in your blood from cancer. It is also used to prevent complications of cancer that has spread to the bone. This medicine may be used for other purposes; ask your health care provider or pharmacist if you have questions. What should I tell my health care provider before I take this medicine? They need to know if you have any of these conditions: -aspirin-sensitive asthma -cancer, especially if you are receiving medicines used to treat cancer -dental disease or wear dentures -infection -kidney disease -receiving corticosteroids like dexamethasone or prednisone -an unusual or allergic reaction to zoledronic acid, other medicines, foods, dyes, or  preservatives -pregnant or trying to get pregnant -breast-feeding How should I use this medicine? This medicine is for infusion into a vein. It is given by a health care professional in a hospital or clinic setting. Talk to your pediatrician regarding the use of this medicine in children. Special care may be needed. Overdosage: If you think you have taken too much of this medicine contact a poison control center or emergency room at once. NOTE: This medicine is only for you. Do not share this medicine with others. What if I miss a dose? It is important not to miss your dose. Call your doctor or health care professional if you are unable to keep an appointment. What may interact with this medicine? -certain antibiotics given by injection -NSAIDs, medicines for pain and inflammation, like ibuprofen or naproxen -some diuretics like bumetanide, furosemide -teriparatide -thalidomide This list may not describe all possible interactions. Give your health care provider a list of all the medicines, herbs, non-prescription drugs, or dietary supplements you use. Also tell them if you smoke, drink alcohol, or use illegal drugs. Some items may interact with your medicine. What should I watch for while using this medicine? Visit your doctor or health care professional for regular checkups. It may be some time before you see the benefit from this medicine. Do not stop taking your medicine unless your doctor tells you to. Your doctor may order blood tests or other tests to see how you are doing. Women should inform their doctor if they wish to become pregnant or think they might be pregnant. There is a potential for serious side effects to an unborn child. Talk to your health care professional  or pharmacist for more information. You should make sure that you get enough calcium and vitamin D while you are taking this medicine. Discuss the foods you eat and the vitamins you take with your health care  professional. Some people who take this medicine have severe bone, joint, and/or muscle pain. This medicine may also increase your risk for jaw problems or a broken thigh bone. Tell your doctor right away if you have severe pain in your jaw, bones, joints, or muscles. Tell your doctor if you have any pain that does not go away or that gets worse. Tell your dentist and dental surgeon that you are taking this medicine. You should not have major dental surgery while on this medicine. See your dentist to have a dental exam and fix any dental problems before starting this medicine. Take good care of your teeth while on this medicine. Make sure you see your dentist for regular follow-up appointments. What side effects may I notice from receiving this medicine? Side effects that you should report to your doctor or health care professional as soon as possible: -allergic reactions like skin rash, itching or hives, swelling of the face, lips, or tongue -anxiety, confusion, or depression -breathing problems -changes in vision -eye pain -feeling faint or lightheaded, falls -jaw pain, especially after dental work -mouth sores -muscle cramps, stiffness, or weakness -redness, blistering, peeling or loosening of the skin, including inside the mouth -trouble passing urine or change in the amount of urine Side effects that usually do not require medical attention (report to your doctor or health care professional if they continue or are bothersome): -bone, joint, or muscle pain -constipation -diarrhea -fever -hair loss -irritation at site where injected -loss of appetite -nausea, vomiting -stomach upset -trouble sleeping -trouble swallowing -weak or tired This list may not describe all possible side effects. Call your doctor for medical advice about side effects. You may report side effects to FDA at 1-800-FDA-1088. Where should I keep my medicine? This drug is given in a hospital or clinic and will not  be stored at home. NOTE: This sheet is a summary. It may not cover all possible information. If you have questions about this medicine, talk to your doctor, pharmacist, or health care provider.    2016, [employer]. (2014-02-11 14:19:39)

## 2015-12-17 NOTE — Progress Notes (Signed)
Hematology and Oncology Follow Up Visit  MATISYAHU FEWKES [id number] 07/25/1946 70 y.o. 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 Petruccelli is here today for a follow-up. He is doing okay. He had no problems over the weekend   We now have him on every 3 week dosing. I this is easier for him.  He's had no problems with pain.   Much to my surprise, is that he is working on the weekends. 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 He really enjoys this.  His appetite is good. He's had no nausea or vomiting.  He's had no fatigue. He's had no problems with bowels or bladder.  He's had no leg swelling. He's had no rashes.     He is on Zometa every 3 weeks. I think we move Zometa to every 3 months.   Medications:    Medication List       This list is accurate as of: 12/17/15 11:13 AM.  Always use your most recent med list.               hydrochlorothiazide 12.5 MG capsule  Commonly known as:  MICROZIDE  TAKE 1 CAPSULE BY MOUTH EVERY MORNING     lidocaine-prilocaine cream  Commonly known as:  EMLA  Apply 1 application topically as needed.     tamsulosin 0.4 MG Caps capsule  Commonly known as:  FLOMAX  Take 2 capsules (0.8 mg total) by mouth daily.     VISINE OP  Apply 1-2 drops to eye daily as needed (redness.). Reported on 11/21/2015        Allergies: No Known Allergies  Past Medical History, Surgical history, Social history, and Family History were reviewed and updated.  Review of Systems: All other 10 point review of systems is negative.   Physical Exam:  weight is 195 lb 1.9 oz (88.506 kg). His oral  temperature is 97.7 F (36.5 C). His blood pressure is 137/91 and his pulse is 75.   Wt Readings from Last 3 Encounters:  12/17/15 195 lb 1.9 oz (88.506 kg)  11/26/15 201 lb (91.173 kg)  11/21/15 198 lb 3.2 oz (89.903 kg)    Well-developed well-nourished Afro-American male in no obvious distress. Head and neck exam shows no ocular or oral lesion. He has no palpable cervical or supraclavicular lymph nodes. Lungs are clear. Cardiac exam regular rate and rhythm with no murmurs, rubs or bruits. Abdomen is soft. He has good bowel sounds. There is no fluid wave. There is no palpable liver or spleen tip. Back exam shows no tenderness over the spine, ribs or hips. X-ray shows no clubbing, cyanosis or edema. Neurological exam shows no focal neurological deficits. Skin exam shows no rashes, ecchymoses or petechia.  Lab Results  Component Value Date   WBC 3.7* 12/17/2015   HGB 12.0* 12/17/2015   HCT 35.4* 12/17/2015   MCV 102* 12/17/2015   PLT 150 12/17/2015   No results found for: FERRITIN, IRON, TIBC, UIBC, IRONPCTSAT Lab Results  Component Value Date   RBC 3.46* 12/17/2015   No results found for: KPAFRELGTCHN, LAMBDASER, KAPLAMBRATIO No  results found for: Kandis Cocking, IGMSERUM No results found for: Odetta Pink, SPEI   Chemistry      Component Value Date/Time   NA 140 12/17/2015 1017   NA 140 11/27/2015 0805   K 4.0 12/17/2015 1017   K 4.8 11/27/2015 0805   CL 104 12/17/2015 1017   CL 109 11/27/2015 0805   CO2 30 12/17/2015 1017   CO2 24 11/27/2015 0805   BUN 22 12/17/2015 1017   BUN 20 11/27/2015 0805   CREATININE 1.5* 12/17/2015 1017   CREATININE 1.14 01/19/2015 1210      Component Value Date/Time   CALCIUM 9.4 12/17/2015 1017   CALCIUM 8.9 11/27/2015 0805   ALKPHOS 35 12/17/2015 1017   ALKPHOS 35* 11/27/2015 0805   AST 33 12/17/2015 1017   AST 19 11/27/2015 0805   ALT 25 12/17/2015 1017   ALT 11 11/27/2015 0805    BILITOT 0.80 12/17/2015 1017   BILITOT 0.5 11/27/2015 0805     Impression and Plan: Mr. Biga is very pleasant 70 yo African American male with poorly differentiated metastatic bladder cancer.   I think that He is really doing well.  His last PET scan was back in mid February. I think that we probably hold off until May to do another one.  Again, I will switch his Zometa every 3 months.  We will plan again back in 3 weeks.[name], MD 3/20/201711:13 AM

## 2016-01-07 ENCOUNTER — Ambulatory Visit (HOSPITAL_BASED_OUTPATIENT_CLINIC_OR_DEPARTMENT_OTHER): Payer: Medicare Other | Admitting: Hematology & Oncology

## 2016-01-07 ENCOUNTER — Other Ambulatory Visit (HOSPITAL_BASED_OUTPATIENT_CLINIC_OR_DEPARTMENT_OTHER): Payer: Medicare Other

## 2016-01-07 ENCOUNTER — Ambulatory Visit: Payer: Medicare Other

## 2016-01-07 ENCOUNTER — Encounter: Payer: Self-pay | Admitting: Hematology & Oncology

## 2016-01-07 VITALS — BP 126/70 | HR 75 | Temp 98.6°F | Resp 18 | Ht 74.0 in | Wt 197.0 lb

## 2016-01-07 DIAGNOSIS — C775 Secondary and unspecified malignant neoplasm of intrapelvic lymph nodes: Principal | ICD-10-CM

## 2016-01-07 DIAGNOSIS — C679 Malignant neoplasm of bladder, unspecified: Secondary | ICD-10-CM

## 2016-01-07 DIAGNOSIS — Z5111 Encounter for antineoplastic chemotherapy: Secondary | ICD-10-CM

## 2016-01-07 DIAGNOSIS — C779 Secondary and unspecified malignant neoplasm of lymph node, unspecified: Secondary | ICD-10-CM | POA: Diagnosis not present

## 2016-01-07 DIAGNOSIS — C7951 Secondary malignant neoplasm of bone: Secondary | ICD-10-CM | POA: Diagnosis not present

## 2016-01-07 DIAGNOSIS — R319 Hematuria, unspecified: Secondary | ICD-10-CM

## 2016-01-07 LAB — CBC WITH DIFFERENTIAL (CANCER CENTER ONLY)
BASO#: 0 10*3/uL (ref 0.0–0.2)
BASO%: 0.3 % (ref 0.0–2.0)
EOS%: 1.1 % (ref 0.0–7.0)
Eosinophils Absolute: 0 10*3/uL (ref 0.0–0.5)
HCT: 27.7 % — ABNORMAL LOW (ref 38.7–49.9)
HEMOGLOBIN: 9.1 g/dL — AB (ref 13.0–17.1)
LYMPH#: 1 10*3/uL (ref 0.9–3.3)
LYMPH%: 26.1 % (ref 14.0–48.0)
MCH: 34.6 pg — AB (ref 28.0–33.4)
MCHC: 32.9 g/dL (ref 32.0–35.9)
MCV: 105 fL — ABNORMAL HIGH (ref 82–98)
MONO#: 0.7 10*3/uL (ref 0.1–0.9)
MONO%: 17.4 % — AB (ref 0.0–13.0)
NEUT%: 55.1 % (ref 40.0–80.0)
NEUTROS ABS: 2.1 10*3/uL (ref 1.5–6.5)
PLATELETS: 120 10*3/uL — AB (ref 145–400)
RBC: 2.63 10*6/uL — AB (ref 4.20–5.70)
RDW: 16.4 % — ABNORMAL HIGH (ref 11.1–15.7)
WBC: 3.8 10*3/uL — AB (ref 4.0–10.0)

## 2016-01-07 LAB — CMP (CANCER CENTER ONLY)
ALBUMIN: 3.2 g/dL — AB (ref 3.3–5.5)
ALT(SGPT): 35 U/L (ref 10–47)
AST: 45 U/L — AB (ref 11–38)
Alkaline Phosphatase: 50 U/L (ref 26–84)
BILIRUBIN TOTAL: 0.7 mg/dL (ref 0.20–1.60)
BUN: 14 mg/dL (ref 7–22)
CHLORIDE: 105 meq/L (ref 98–108)
CO2: 27 meq/L (ref 18–33)
CREATININE: 1 mg/dL (ref 0.6–1.2)
Calcium: 9.3 mg/dL (ref 8.0–10.3)
Glucose, Bld: 111 mg/dL (ref 73–118)
Potassium: 3.7 mEq/L (ref 3.3–4.7)
SODIUM: 142 meq/L (ref 128–145)
TOTAL PROTEIN: 7.7 g/dL (ref 6.4–8.1)

## 2016-01-07 LAB — LACTATE DEHYDROGENASE: LDH: 196 U/L (ref 125–245)

## 2016-01-07 MED ORDER — SODIUM CHLORIDE 0.9 % IV SOLN
441.5000 mg | Freq: Once | INTRAVENOUS | Status: AC
  Administered 2016-01-07: 440 mg via INTRAVENOUS
  Filled 2016-01-07: qty 44

## 2016-01-07 MED ORDER — HEPARIN SOD (PORK) LOCK FLUSH 100 UNIT/ML IV SOLN
500.0000 [IU] | Freq: Once | INTRAVENOUS | Status: AC | PRN
  Administered 2016-01-07: 500 [IU]
  Filled 2016-01-07: qty 5

## 2016-01-07 MED ORDER — SODIUM CHLORIDE 0.9 % IV SOLN
Freq: Once | INTRAVENOUS | Status: AC
  Administered 2016-01-07: 09:00:00 via INTRAVENOUS

## 2016-01-07 MED ORDER — CARBOPLATIN CHEMO INTRADERMAL TEST DOSE 100MCG/0.02ML
100.0000 ug | Freq: Once | INTRADERMAL | Status: AC
  Administered 2016-01-07: 100 ug via INTRADERMAL
  Filled 2016-01-07: qty 0.01

## 2016-01-07 MED ORDER — GEMCITABINE HCL CHEMO INJECTION 1 GM/26.3ML
640.0000 mg/m2 | Freq: Once | INTRAVENOUS | Status: AC
  Administered 2016-01-07: 1330 mg via INTRAVENOUS
  Filled 2016-01-07: qty 35

## 2016-01-07 MED ORDER — SODIUM CHLORIDE 0.9 % IJ SOLN
10.0000 mL | INTRAMUSCULAR | Status: DC | PRN
  Administered 2016-01-07: 10 mL
  Filled 2016-01-07: qty 10

## 2016-01-07 MED ORDER — SODIUM CHLORIDE 0.9 % IV SOLN
Freq: Once | INTRAVENOUS | Status: AC
  Administered 2016-01-07: 10:00:00 via INTRAVENOUS
  Filled 2016-01-07: qty 8

## 2016-01-07 NOTE — Progress Notes (Signed)
Hematology and Oncology Follow Up Visit  Dustin Jordan [id number] March 18, 1946 70 y.o. 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 He is doing okay. He had no problems over the weekend   The every 3 week dosing seems be working well with him. He is tolerating this well. His blood counts seem to be doing okay.  He's had no problems with nausea or vomiting. He's had no diarrhea. He's had no bleeding. He's had no pain. He's not noted any leg swelling.  He's had no mouth sores.  He's had no fever.  Overall, his performance status is ECOG   Medications:    Medication List       This list is accurate as of: 01/07/16 11:48 AM.  Always use your most recent med list.               hydrochlorothiazide 12.5 MG capsule  Commonly known as:  MICROZIDE  TAKE 1 CAPSULE BY MOUTH EVERY MORNING     lidocaine-prilocaine cream  Commonly known as:  EMLA  Apply 1 application topically as needed.     tamsulosin 0.4 MG Caps capsule  Commonly known as:  FLOMAX  Take 2 capsules (0.8 mg total) by mouth daily.     VISINE OP  Apply 1-2 drops to eye daily as needed (redness.). Reported on 11/21/2015        Allergies: No Known Allergies  Past Medical History, Surgical history, Social history, and Family History were reviewed and updated.  Review of Systems: All other 10 point review of systems is negative.   Physical Exam:  height is 6\' 2"  (1.88 m) and weight is 197 lb (89.359 kg). His oral temperature is 98.6 F (37 C). His blood pressure is 126/70 and his pulse is 75. His respiration is 18.   Wt Readings from Last 3 Encounters:  01/07/16 197 lb (89.359 kg)  12/17/15 195 lb 1.9 oz (88.506 kg)  11/26/15 201 lb (91.173 kg)    Well-developed well-nourished Afro-American  male in no obvious distress. Head and neck exam shows no ocular or oral lesion. He has no palpable cervical or supraclavicular lymph nodes. Lungs are clear. Cardiac exam regular rate and rhythm with no murmurs, rubs or bruits. Abdomen is soft. He has good bowel sounds. There is no fluid wave. There is no palpable liver or spleen tip. Back exam shows no tenderness over the spine, ribs or hips. X-ray shows no clubbing, cyanosis or edema. Neurological exam shows no focal neurological deficits. Skin exam shows no rashes, ecchymoses or petechia.  Lab Results  Component Value Date   WBC 3.8* 01/07/2016   HGB 9.1* 01/07/2016   HCT 27.7* 01/07/2016   MCV 105* 01/07/2016   PLT 120* 01/07/2016   No results found for: FERRITIN, IRON, TIBC, UIBC, IRONPCTSAT Lab Results  Component Value Date   RBC 2.63* 01/07/2016   No results found for: KPAFRELGTCHN, LAMBDASER, KAPLAMBRATIO No results found for: IGGSERUM, IGA, IGMSERUM No results found for: Odetta Pink, SPEI   Chemistry      Component Value Date/Time   NA 142 01/07/2016 0812   NA 140 11/27/2015 0805   K 3.7 01/07/2016 0812   K 4.8 11/27/2015 0805   CL 105 01/07/2016  0812   CL 109 11/27/2015 0805   CO2 27 01/07/2016 0812   CO2 24 11/27/2015 0805   BUN 14 01/07/2016 0812   BUN 20 11/27/2015 0805   CREATININE 1.0 01/07/2016 0812   CREATININE 1.14 01/19/2015 1210      Component Value Date/Time   CALCIUM 9.3 01/07/2016 0812   CALCIUM 8.9 11/27/2015 0805   ALKPHOS 50 01/07/2016 0812   ALKPHOS 35* 11/27/2015 0805   AST 45* 01/07/2016 0812   AST 19 11/27/2015 0805   ALT 35 01/07/2016 0812   ALT 11 11/27/2015 0805   BILITOT 0.70 01/07/2016 0812   BILITOT 0.5 11/27/2015 0805     Impression and Plan: Dustin Jordan is very pleasant 70 yo African American male with poorly differentiated metastatic bladder cancer.   I think that he is really doing well.  His last PET scan was back in mid  February. I think that we probably hold off until May to do another one.  Again, I will switch his Zometa every 3 months.  We will plan again back in 3 weeks.   [name], MD 4/10/201711:48 AM

## 2016-01-07 NOTE — Patient Instructions (Signed)
Carboplatin injection What is this medicine? CARBOPLATIN (KAR boe pla tin) is a chemotherapy drug. It targets fast dividing cells, like cancer cells, and causes these cells to die. This medicine is used to treat ovarian cancer and many other cancers. This medicine may be used for other purposes; ask your health care provider or pharmacist if you have questions. What should I tell my health care provider before I take this medicine? They need to know if you have any of these conditions: -blood disorders -hearing problems -kidney disease -recent or ongoing radiation therapy -an unusual or allergic reaction to carboplatin, cisplatin, other chemotherapy, other medicines, foods, dyes, or preservatives -pregnant or trying to get pregnant -breast-feeding How should I use this medicine? This drug is usually given as an infusion into a vein. It is administered in a hospital or clinic by a specially trained health care professional. Talk to your pediatrician regarding the use of this medicine in children. Special care may be needed. Overdosage: If you think you have taken too much of this medicine contact a poison control center or emergency room at once. NOTE: This medicine is only for you. Do not share this medicine with others. What if I miss a dose? It is important not to miss a dose. Call your doctor or health care professional if you are unable to keep an appointment. What may interact with this medicine? -medicines for seizures -medicines to increase blood counts like filgrastim, pegfilgrastim, sargramostim -some antibiotics like amikacin, gentamicin, neomycin, streptomycin, tobramycin -vaccines Talk to your doctor or health care professional before taking any of these medicines: -acetaminophen -aspirin -ibuprofen -ketoprofen -naproxen This list may not describe all possible interactions. Give your health care provider a list of all the medicines, herbs, non-prescription drugs, or dietary  supplements you use. Also tell them if you smoke, drink alcohol, or use illegal drugs. Some items may interact with your medicine. What should I watch for while using this medicine? Your condition will be monitored carefully while you are receiving this medicine. You will need important blood work done while you are taking this medicine. This drug may make you feel generally unwell. This is not uncommon, as chemotherapy can affect healthy cells as well as cancer cells. Report any side effects. Continue your course of treatment even though you feel ill unless your doctor tells you to stop. In some cases, you may be given additional medicines to help with side effects. Follow all directions for their use. Call your doctor or health care professional for advice if you get a fever, chills or sore throat, or other symptoms of a cold or flu. Do not treat yourself. This drug decreases your body's ability to fight infections. Try to avoid being around people who are sick. This medicine may increase your risk to bruise or bleed. Call your doctor or health care professional if you notice any unusual bleeding. Be careful brushing and flossing your teeth or using a toothpick because you may get an infection or bleed more easily. If you have any dental work done, tell your dentist you are receiving this medicine. Avoid taking products that contain aspirin, acetaminophen, ibuprofen, naproxen, or ketoprofen unless instructed by your doctor. These medicines may hide a fever. Do not become pregnant while taking this medicine. Women should inform their doctor if they wish to become pregnant or think they might be pregnant. There is a potential for serious side effects to an unborn child. Talk to your health care professional or pharmacist for more information.   Do not breast-feed an infant while taking this medicine. What side effects may I notice from receiving this medicine? Side effects that you should report to your  doctor or health care professional as soon as possible: -allergic reactions like skin rash, itching or hives, swelling of the face, lips, or tongue -signs of infection - fever or chills, cough, sore throat, pain or difficulty passing urine -signs of decreased platelets or bleeding - bruising, pinpoint red spots on the skin, black, tarry stools, nosebleeds -signs of decreased red blood cells - unusually weak or tired, fainting spells, lightheadedness -breathing problems -changes in hearing -changes in vision -chest pain -high blood pressure -low blood counts - This drug may decrease the number of white blood cells, red blood cells and platelets. You may be at increased risk for infections and bleeding. -nausea and vomiting -pain, swelling, redness or irritation at the injection site -pain, tingling, numbness in the hands or feet -problems with balance, talking, walking -trouble passing urine or change in the amount of urine Side effects that usually do not require medical attention (report to your doctor or health care professional if they continue or are bothersome): -hair loss -loss of appetite -metallic taste in the mouth or changes in taste This list may not describe all possible side effects. Call your doctor for medical advice about side effects. You may report side effects to FDA at 1-800-FDA-1088. Where should I keep my medicine? This drug is given in a hospital or clinic and will not be stored at home. NOTE: This sheet is a summary. It may not cover all possible information. If you have questions about this medicine, talk to your doctor, pharmacist, or health care provider.    2016, [employer]. (2007-12-21 14:38:05) Gemcitabine injection What is this medicine? GEMCITABINE (jem SIT a been) is a chemotherapy drug. This medicine is used to treat many types of cancer like breast cancer, lung cancer, pancreatic cancer, and ovarian cancer. This medicine may be used for  other purposes; ask your health care provider or pharmacist if you have questions. What should I tell my health care provider before I take this medicine? They need to know if you have any of these conditions: -blood disorders -infection -kidney disease -liver disease -recent or ongoing radiation therapy -an unusual or allergic reaction to gemcitabine, other chemotherapy, other medicines, foods, dyes, or preservatives -pregnant or trying to get pregnant -breast-feeding How should I use this medicine? This drug is given as an infusion into a vein. It is administered in a hospital or clinic by a specially trained health care professional. Talk to your pediatrician regarding the use of this medicine in children. Special care may be needed. Overdosage: If you think you have taken too much of this medicine contact a poison control center or emergency room at once. NOTE: This medicine is only for you. Do not share this medicine with others. What if I miss a dose? It is important not to miss your dose. Call your doctor or health care professional if you are unable to keep an appointment. What may interact with this medicine? -medicines to increase blood counts like filgrastim, pegfilgrastim, sargramostim -some other chemotherapy drugs like cisplatin -vaccines Talk to your doctor or health care professional before taking any of these medicines: -acetaminophen -aspirin -ibuprofen -ketoprofen -naproxen This list may not describe all possible interactions. Give your health care provider a list of all the medicines, herbs, non-prescription drugs, or dietary supplements you use. Also tell them if you smoke, drink   alcohol, or use illegal drugs. Some items may interact with your medicine. What should I watch for while using this medicine? Visit your doctor for checks on your progress. This drug may make you feel generally unwell. This is not uncommon, as chemotherapy can affect healthy cells as well  as cancer cells. Report any side effects. Continue your course of treatment even though you feel ill unless your doctor tells you to stop. In some cases, you may be given additional medicines to help with side effects. Follow all directions for their use. Call your doctor or health care professional for advice if you get a fever, chills or sore throat, or other symptoms of a cold or flu. Do not treat yourself. This drug decreases your body's ability to fight infections. Try to avoid being around people who are sick. This medicine may increase your risk to bruise or bleed. Call your doctor or health care professional if you notice any unusual bleeding. Be careful brushing and flossing your teeth or using a toothpick because you may get an infection or bleed more easily. If you have any dental work done, tell your dentist you are receiving this medicine. Avoid taking products that contain aspirin, acetaminophen, ibuprofen, naproxen, or ketoprofen unless instructed by your doctor. These medicines may hide a fever. Women should inform their doctor if they wish to become pregnant or think they might be pregnant. There is a potential for serious side effects to an unborn child. Talk to your health care professional or pharmacist for more information. Do not breast-feed an infant while taking this medicine. What side effects may I notice from receiving this medicine? Side effects that you should report to your doctor or health care professional as soon as possible: -allergic reactions like skin rash, itching or hives, swelling of the face, lips, or tongue -low blood counts - this medicine may decrease the number of white blood cells, red blood cells and platelets. You may be at increased risk for infections and bleeding. -signs of infection - fever or chills, cough, sore throat, pain or difficulty passing urine -signs of decreased platelets or bleeding - bruising, pinpoint red spots on the skin, black, tarry  stools, blood in the urine -signs of decreased red blood cells - unusually weak or tired, fainting spells, lightheadedness -breathing problems -chest pain -mouth sores -nausea and vomiting -pain, swelling, redness at site where injected -pain, tingling, numbness in the hands or feet -stomach pain -swelling of ankles, feet, hands -unusual bleeding Side effects that usually do not require medical attention (report to your doctor or health care professional if they continue or are bothersome): -constipation -diarrhea -hair loss -loss of appetite -stomach upset This list may not describe all possible side effects. Call your doctor for medical advice about side effects. You may report side effects to FDA at 1-800-FDA-1088. Where should I keep my medicine? This drug is given in a hospital or clinic and will not be stored at home. NOTE: This sheet is a summary. It may not cover all possible information. If you have questions about this medicine, talk to your doctor, pharmacist, or health care provider.    2016, [employer]. (2008-01-25 18:45:54)  

## 2016-01-21 ENCOUNTER — Ambulatory Visit (INDEPENDENT_AMBULATORY_CARE_PROVIDER_SITE_OTHER): Payer: Medicare Other

## 2016-01-21 ENCOUNTER — Ambulatory Visit (INDEPENDENT_AMBULATORY_CARE_PROVIDER_SITE_OTHER): Payer: Medicare Other | Admitting: Family Medicine

## 2016-01-21 VITALS — BP 110/62 | HR 83 | Temp 97.8°F | Resp 16 | Ht 74.0 in | Wt 193.0 lb

## 2016-01-21 DIAGNOSIS — M19012 Primary osteoarthritis, left shoulder: Secondary | ICD-10-CM | POA: Diagnosis not present

## 2016-01-21 DIAGNOSIS — M25512 Pain in left shoulder: Secondary | ICD-10-CM

## 2016-01-21 DIAGNOSIS — M67912 Unspecified disorder of synovium and tendon, left shoulder: Secondary | ICD-10-CM

## 2016-01-21 DIAGNOSIS — M7542 Impingement syndrome of left shoulder: Secondary | ICD-10-CM

## 2016-01-21 LAB — IFOBT (OCCULT BLOOD): IMMUNOLOGICAL FECAL OCCULT BLOOD TEST: NEGATIVE

## 2016-01-21 MED ORDER — MELOXICAM 7.5 MG PO TABS: 7.5000 mg | ORAL_TABLET | Freq: Every day | ORAL | Status: DC

## 2016-01-21 NOTE — Patient Instructions (Addendum)
IF you received an x-ray today, you will receive an invoice from [facility]. Please contact [facility] at [phone] with questions or concerns regarding your invoice.   IF you received labwork today, you will receive an invoice from [employer]. Please contact [facility] at [phone] with questions or concerns regarding your invoice.   Our billing staff will not be able to assist you with questions regarding bills from these companies.  You will be contacted with the lab results as soon as they are available. The fastest way to get your results is to activate your My Chart account. Instructions are located on the last page of this paperwork. If you have not heard from [place] regarding the results in 2 weeks, please contact this office.    Your shoulder pain appears to be due to rotator cuff tendinosis and some impingement or bursitis. See information on this below. For now can try the exercises as listed below, and meloxicam once per day for the next 2-3 weeks at the most. If not improving by that point, return for possible injection. Sooner if worse.  Impingement Syndrome, Rotator Cuff, Bursitis With Rehab Impingement syndrome is a condition that involves inflammation of the tendons of the rotator cuff and the subacromial bursa, that causes pain in the shoulder. The rotator cuff consists of four tendons and muscles that control much of the shoulder and upper arm function. The subacromial bursa is a fluid filled sac that helps reduce friction between the rotator cuff and one of the bones of the shoulder (acromion). Impingement syndrome is usually an overuse injury that causes swelling of the bursa (bursitis), swelling of the tendon (tendonitis), and/or a tear of the tendon (strain). Strains are classified into three categories. Grade 1 strains cause pain, but the tendon is not lengthened. Grade 2 strains include a lengthened ligament, due to the  ligament being stretched or partially ruptured. With grade 2 strains there is still function, although the function may be decreased. Grade 3 strains include a complete tear of the tendon or muscle, and function is usually impaired. SYMPTOMS   Pain around the shoulder, often at the outer portion of the upper arm.  Pain that gets worse with shoulder function, especially when reaching overhead or lifting.  Sometimes, aching when not using the arm.  Pain that wakes you up at night.  Sometimes, tenderness, swelling, warmth, or redness over the affected area.  Loss of strength.  Limited motion of the shoulder, especially reaching behind the back (to the back pocket or to unhook bra) or across your body.  Crackling sound (crepitation) when moving the arm.  Biceps tendon pain and inflammation (in the front of the shoulder). Worse when bending the elbow or lifting. CAUSES  Impingement syndrome is often an overuse injury, in which chronic (repetitive) motions cause the tendons or bursa to become inflamed. A strain occurs when a force is paced on the tendon or muscle that is greater than it can withstand. Common mechanisms of injury include: Stress from sudden increase in duration, frequency, or intensity of training.  Direct hit (trauma) to the shoulder.  Aging, erosion of the tendon with normal use.  Bony bump on shoulder (acromial spur). RISK INCREASES WITH:  Contact sports (football, wrestling, boxing).  Throwing sports (baseball, tennis, volleyball).  Weightlifting and bodybuilding.  Heavy labor.  Previous injury to the rotator cuff, including impingement.  Poor shoulder strength and flexibility.  Failure to warm up properly before activity.  Inadequate  protective equipment.  Old age.  Bony bump on shoulder (acromial spur). PREVENTION   Warm up and stretch properly before activity.  Allow for adequate recovery between workouts.  Maintain physical  fitness:  Strength, flexibility, and endurance.  Cardiovascular fitness.  Learn and use proper exercise technique. PROGNOSIS  If treated properly, impingement syndrome usually goes away within 6 weeks. Sometimes surgery is required.  RELATED COMPLICATIONS   Longer healing time if not properly treated, or if not given enough time to heal.  Recurring symptoms, that result in a chronic condition.  Shoulder stiffness, frozen shoulder, or loss of motion.  Rotator cuff tendon tear.  Recurring symptoms, especially if activity is resumed too soon, with overuse, with a direct blow, or when using poor technique. TREATMENT  Treatment first involves the use of ice and medicine, to reduce pain and inflammation. The use of strengthening and stretching exercises may help reduce pain with activity. These exercises may be performed at home or with a therapist. If non-surgical treatment is unsuccessful after more than 6 months, surgery may be advised. After surgery and rehabilitation, activity is usually possible in 3 months.  MEDICATION  If pain medicine is needed, nonsteroidal anti-inflammatory medicines (aspirin and ibuprofen), or other minor pain relievers (acetaminophen), are often advised.  Do not take pain medicine for 7 days before surgery.  Prescription pain relievers may be given, if your caregiver thinks they are needed. Use only as directed and only as much as you need.  Corticosteroid injections may be given by your caregiver. These injections should be reserved for the most serious cases, because they may only be given a certain number of times. HEAT AND COLD  Cold treatment (icing) should be applied for 10 to 15 minutes every 2 to 3 hours for inflammation and pain, and immediately after activity that aggravates your symptoms. Use ice packs or an ice massage.  Heat treatment may be used before performing stretching and strengthening activities prescribed by your caregiver, physical  therapist, or athletic trainer. Use a heat pack or a warm water soak. SEEK MEDICAL CARE IF:   Symptoms get worse or do not improve in 4 to 6 weeks, despite treatment.  New, unexplained symptoms develop. (Drugs used in treatment may produce side effects.) EXERCISES  RANGE OF MOTION (ROM) AND STRETCHING EXERCISES - Impingement Syndrome (Rotator Cuff  Tendinitis, Bursitis) These exercises may help you when beginning to rehabilitate your injury. Your symptoms may go away with or without further involvement from your physician, physical therapist or athletic trainer. While completing these exercises, remember:   Restoring tissue flexibility helps normal motion to return to the joints. This allows healthier, less painful movement and activity.  An effective stretch should be held for at least 30 seconds.  A stretch should never be painful. You should only feel a gentle lengthening or release in the stretched tissue. STRETCH - Flexion, Standing  Stand with good posture. With an underhand grip on your right / left hand, and an overhand grip on the opposite hand, grasp a broomstick or cane so that your hands are a little more than shoulder width apart.  Keeping your right / left elbow straight and shoulder muscles relaxed, push the stick with your opposite hand, to raise your right / left arm in front of your body and then overhead. Raise your arm until you feel a stretch in your right / left shoulder, but before you have increased shoulder pain.  Try to avoid shrugging your right / left shoulder  as your arm rises, by keeping your shoulder blade tucked down and toward your mid-back spine. Hold for __________ seconds.  Slowly return to the starting position. Repeat __________ times. Complete this exercise __________ times per day. STRETCH - Abduction, Supine  Lie on your back. With an underhand grip on your right / left hand and an overhand grip on the opposite hand, grasp a broomstick or cane so  that your hands are a little more than shoulder width apart.  Keeping your right / left elbow straight and your shoulder muscles relaxed, push the stick with your opposite hand, to raise your right / left arm out to the side of your body and then overhead. Raise your arm until you feel a stretch in your right / left shoulder, but before you have increased shoulder pain.  Try to avoid shrugging your right / left shoulder as your arm rises, by keeping your shoulder blade tucked down and toward your mid-back spine. Hold for __________ seconds.  Slowly return to the starting position. Repeat __________ times. Complete this exercise __________ times per day. ROM - Flexion, Active-Assisted  Lie on your back. You may bend your knees for comfort.  Grasp a broomstick or cane so your hands are about shoulder width apart. Your right / left hand should grip the end of the stick, so that your hand is positioned "thumbs-up," as if you were about to shake hands.  Using your healthy arm to lead, raise your right / left arm overhead, until you feel a gentle stretch in your shoulder. Hold for __________ seconds.  Use the stick to assist in returning your right / left arm to its starting position. Repeat __________ times. Complete this exercise __________ times per day.  ROM - Internal Rotation, Supine   Lie on your back on a firm surface. Place your right / left elbow about 60 degrees away from your side. Elevate your elbow with a folded towel, so that the elbow and shoulder are the same height.  Using a broomstick or cane and your strong arm, pull your right / left hand toward your body until you feel a gentle stretch, but no increase in your shoulder pain. Keep your shoulder and elbow in place throughout the exercise.  Hold for __________ seconds. Slowly return to the starting position. Repeat __________ times. Complete this exercise __________ times per day. STRETCH - Internal Rotation  Place your right  / left hand behind your back, palm up.  Throw a towel or belt over your opposite shoulder. Grasp the towel with your right / left hand.  While keeping an upright posture, gently pull up on the towel, until you feel a stretch in the front of your right / left shoulder.  Avoid shrugging your right / left shoulder as your arm rises, by keeping your shoulder blade tucked down and toward your mid-back spine.  Hold for __________ seconds. Release the stretch, by lowering your healthy hand. Repeat __________ times. Complete this exercise __________ times per day. ROM - Internal Rotation   Using an underhand grip, grasp a stick behind your back with both hands.  While standing upright with good posture, slide the stick up your back until you feel a mild stretch in the front of your shoulder.  Hold for __________ seconds. Slowly return to your starting position. Repeat __________ times. Complete this exercise __________ times per day.  STRETCH - Posterior Shoulder Capsule   Stand or sit with good posture. Grasp your right / left  elbow and draw it across your chest, keeping it at the same height as your shoulder.  Pull your elbow, so your upper arm comes in closer to your chest. Pull until you feel a gentle stretch in the back of your shoulder.  Hold for __________ seconds. Repeat __________ times. Complete this exercise __________ times per day. STRENGTHENING EXERCISES - Impingement Syndrome (Rotator Cuff Tendinitis, Bursitis) These exercises may help you when beginning to rehabilitate your injury. They may resolve your symptoms with or without further involvement from your physician, physical therapist or athletic trainer. While completing these exercises, remember:  Muscles can gain both the endurance and the strength needed for everyday activities through controlled exercises.  Complete these exercises as instructed by your physician, physical therapist or athletic trainer. Increase the  resistance and repetitions only as guided.  You may experience muscle soreness or fatigue, but the pain or discomfort you are trying to eliminate should never worsen during these exercises. If this pain does get worse, stop and make sure you are following the directions exactly. If the pain is still present after adjustments, discontinue the exercise until you can discuss the trouble with your clinician.  During your recovery, avoid activity or exercises which involve actions that place your injured hand or elbow above your head or behind your back or head. These positions stress the tissues which you are trying to heal. STRENGTH - Scapular Depression and Adduction   With good posture, sit on a firm chair. Support your arms in front of you, with pillows, arm rests, or on a table top. Have your elbows in line with the sides of your body.  Gently draw your shoulder blades down and toward your mid-back spine. Gradually increase the tension, without tensing the muscles along the top of your shoulders and the back of your neck.  Hold for __________ seconds. Slowly release the tension and relax your muscles completely before starting the next repetition.  After you have practiced this exercise, remove the arm support and complete the exercise in standing as well as sitting position. Repeat __________ times. Complete this exercise __________ times per day.  STRENGTH - Shoulder Abductors, Isometric  With good posture, stand or sit about 4-6 inches from a wall, with your right / left side facing the wall.  Bend your right / left elbow. Gently press your right / left elbow into the wall. Increase the pressure gradually, until you are pressing as hard as you can, without shrugging your shoulder or increasing any shoulder discomfort.  Hold for __________ seconds.  Release the tension slowly. Relax your shoulder muscles completely before you begin the next repetition. Repeat __________ times. Complete  this exercise __________ times per day.  STRENGTH - External Rotators, Isometric  Keep your right / left elbow at your side and bend it 90 degrees.  Step into a door frame so that the outside of your right / left wrist can press against the door frame without your upper arm leaving your side.  Gently press your right / left wrist into the door frame, as if you were trying to swing the back of your hand away from your stomach. Gradually increase the tension, until you are pressing as hard as you can, without shrugging your shoulder or increasing any shoulder discomfort.  Hold for __________ seconds.  Release the tension slowly. Relax your shoulder muscles completely before you begin the next repetition. Repeat __________ times. Complete this exercise __________ times per day.  STRENGTH - Supraspinatus  Stand or sit with good posture. Grasp a __________ weight, or an exercise band or tubing, so that your hand is "thumbs-up," like you are shaking hands.  Slowly lift your right / left arm in a "V" away from your thigh, diagonally into the space between your side and straight ahead. Lift your hand to shoulder height or as far as you can, without increasing any shoulder pain. At first, many people do not lift their hands above shoulder height.  Avoid shrugging your right / left shoulder as your arm rises, by keeping your shoulder blade tucked down and toward your mid-back spine.  Hold for __________ seconds. Control the descent of your hand, as you slowly return to your starting position. Repeat __________ times. Complete this exercise __________ times per day.  STRENGTH - External Rotators  Secure a rubber exercise band or tubing to a fixed object (table, pole) so that it is at the same height as your right / left elbow when you are standing or sitting on a firm surface.  Stand or sit so that the secured exercise band is at your uninjured side.  Bend your right / left elbow 90 degrees. Place  a folded towel or small pillow under your right / left arm, so that your elbow is a few inches away from your side.  Keeping the tension on the exercise band, pull it away from your body, as if pivoting on your elbow. Be sure to keep your body steady, so that the movement is coming only from your rotating shoulder.  Hold for __________ seconds. Release the tension in a controlled manner, as you return to the starting position. Repeat __________ times. Complete this exercise __________ times per day.  STRENGTH - Internal Rotators   Secure a rubber exercise band or tubing to a fixed object (table, pole) so that it is at the same height as your right / left elbow when you are standing or sitting on a firm surface.  Stand or sit so that the secured exercise band is at your right / left side.  Bend your elbow 90 degrees. Place a folded towel or small pillow under your right / left arm so that your elbow is a few inches away from your side.  Keeping the tension on the exercise band, pull it across your body, toward your stomach. Be sure to keep your body steady, so that the movement is coming only from your rotating shoulder.  Hold for __________ seconds. Release the tension in a controlled manner, as you return to the starting position. Repeat __________ times. Complete this exercise __________ times per day.  STRENGTH - Scapular Protractors, Standing   Stand arms length away from a wall. Place your hands on the wall, keeping your elbows straight.  Begin by dropping your shoulder blades down and toward your mid-back spine.  To strengthen your protractors, keep your shoulder blades down, but slide them forward on your rib cage. It will feel as if you are lifting the back of your rib cage away from the wall. This is a subtle motion and can be challenging to complete. Ask your caregiver for further instruction, if you are not sure you are doing the exercise correctly.  Hold for __________ seconds.  Slowly return to the starting position, resting the muscles completely before starting the next repetition. Repeat __________ times. Complete this exercise __________ times per day. STRENGTH - Scapular Protractors, Supine  Lie on your back on a firm surface. Extend your right /  left arm straight into the air while holding a __________ weight in your hand.  Keeping your head and back in place, lift your shoulder off the floor.  Hold for __________ seconds. Slowly return to the starting position, and allow your muscles to relax completely before starting the next repetition. Repeat __________ times. Complete this exercise __________ times per day. STRENGTH - Scapular Protractors, Quadruped  Get onto your hands and knees, with your shoulders directly over your hands (or as close as you can be, comfortably).  Keeping your elbows locked, lift the back of your rib cage up into your shoulder blades, so your mid-back rounds out. Keep your neck muscles relaxed.  Hold this position for __________ seconds. Slowly return to the starting position and allow your muscles to relax completely before starting the next repetition. Repeat __________ times. Complete this exercise __________ times per day.  STRENGTH - Scapular Retractors  Secure a rubber exercise band or tubing to a fixed object (table, pole), so that it is at the height of your shoulders when you are either standing, or sitting on a firm armless chair.  With a palm down grip, grasp an end of the band in each hand. Straighten your elbows and lift your hands straight in front of you, at shoulder height. Step back, away from the secured end of the band, until it becomes tense.  Squeezing your shoulder blades together, draw your elbows back toward your sides, as you bend them. Keep your upper arms lifted away from your body throughout the exercise.  Hold for __________ seconds. Slowly ease the tension on the band, as you reverse the directions and  return to the starting position. Repeat __________ times. Complete this exercise __________ times per day. STRENGTH - Shoulder Extensors   Secure a rubber exercise band or tubing to a fixed object (table, pole) so that it is at the height of your shoulders when you are either standing, or sitting on a firm armless chair.  With a thumbs-up grip, grasp an end of the band in each hand. Straighten your elbows and lift your hands straight in front of you, at shoulder height. Step back, away from the secured end of the band, until it becomes tense.  Squeezing your shoulder blades together, pull your hands down to the sides of your thighs. Do not allow your hands to go behind you.  Hold for __________ seconds. Slowly ease the tension on the band, as you reverse the directions and return to the starting position. Repeat __________ times. Complete this exercise __________ times per day.  STRENGTH - Scapular Retractors and External Rotators   Secure a rubber exercise band or tubing to a fixed object (table, pole) so that it is at the height as your shoulders, when you are either standing, or sitting on a firm armless chair.  With a palm down grip, grasp an end of the band in each hand. Bend your elbows 90 degrees and lift your elbows to shoulder height, at your sides. Step back, away from the secured end of the band, until it becomes tense.  Squeezing your shoulder blades together, rotate your shoulders so that your upper arms and elbows remain stationary, but your fists travel upward to head height.  Hold for __________ seconds. Slowly ease the tension on the band, as you reverse the directions and return to the starting position. Repeat __________ times. Complete this exercise __________ times per day.  STRENGTH - Scapular Retractors and External Rotators, Rowing   Secure  a rubber exercise band or tubing to a fixed object (table, pole) so that it is at the height of your shoulders, when you are  either standing, or sitting on a firm armless chair.  With a palm down grip, grasp an end of the band in each hand. Straighten your elbows and lift your hands straight in front of you, at shoulder height. Step back, away from the secured end of the band, until it becomes tense.  Step 1: Squeeze your shoulder blades together. Bending your elbows, draw your hands to your chest, as if you are rowing a boat. At the end of this motion, your hands and elbow should be at shoulder height and your elbows should be out to your sides.  Step 2: Rotate your shoulders, to raise your hands above your head. Your forearms should be vertical and your upper arms should be horizontal.  Hold for __________ seconds. Slowly ease the tension on the band, as you reverse the directions and return to the starting position. Repeat __________ times. Complete this exercise __________ times per day.  STRENGTH - Scapular Depressors  Find a sturdy chair without wheels, such as a dining room chair.  Keeping your feet on the floor, and your hands on the chair arms, lift your bottom up from the seat, and lock your elbows.  Keeping your elbows straight, allow gravity to pull your body weight down. Your shoulders will rise toward your ears.  Raise your body against gravity by drawing your shoulder blades down your back, shortening the distance between your shoulders and ears. Although your feet should always maintain contact with the floor, your feet should progressively support less body weight, as you get stronger.  Hold for __________ seconds. In a controlled and slow manner, lower your body weight to begin the next repetition. Repeat __________ times. Complete this exercise __________ times per day.    This information is not intended to replace advice given to you by your health care provider. Make sure you discuss any questions you have with your health care provider.   Document Released: 09/15/2005 Document Revised:  10/06/2014 Document Reviewed: 12/28/2008 [employer] Interactive Patient Education [employer].

## 2016-01-21 NOTE — Progress Notes (Signed)
By signing my name below I, Tereasa Coop, attest that this documentation has been prepared under the direction and in the presence of Wendie Agreste, MD. Electonically Signed. Tereasa Coop, Scribe 01/21/2016 at 9:31 AM  Subjective:    Patient ID: Dustin Jordan, male    DOB: 12/04/1945, 70 y.o.   MRN: [id number]  Chief Complaint  Patient presents with  . Shoulder Pain    Left shoulder, x 1 month    HPI RHEA BAJOREK is a 70 y.o. male who presents to the [facility] complaining of left shoulder pain for the past month. Pt states that he has been trying TENS unit, heating, and icing shoulder with no relief of pain. Pt denies trying any OTC medication for pain. Pain is worsened with movement. Pt also states pain is worse when he is trying to sleep at night. Pt is rt handed. Pt denies any known injury or strain to the left shoulder. Pt states when pain started that it was initially bilat and in both shoulders and his rt shoulder pain resolved on its own and the left has been constant.  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 Plan for PET scan in May 2017. Prior Pet scan was in February 2017, No uptake noted around shoulder.    Patient Active Problem List   Diagnosis Date Noted  . 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 Lymphadenopathy, pelvic 01/20/2015  . Partial small bowel obstruction ([place]) 01/19/2015  . Thyroid mass 10/04/2014  . Clot retention of urine 01/01/2014  . HTN (hypertension) 04/20/2013  . Hematuria 04/20/2013   Past Medical History  Diagnosis Date  . Hypertension   . White coat hypertension   . Swelling of right knee joint     s/p arthroscopy 06-08-2013  . Bladder cancer ([place])   . Hematuria   . Frequency of urination   . 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 Knee arthroscopy Right  06-08-2013  . Cystoscopy w/ retrogrades Bilateral 06/22/2013    Procedure: CYSTOSCOPY WITH RETROGRADE PYELOGRAM;  Surgeon: Alexis Frock, MD;  Location: [facility];  Service: Urology;  Laterality: Bilateral;  . Transurethral resection of bladder tumor with gyrus (turbt-gyrus) N/A 06/22/2013    Procedure: TRANSURETHRAL RESECTION OF BLADDER TUMOR WITH GYRUS (TURBT-GYRUS);  Surgeon: Alexis Frock, MD;  Location: [facility];  Service: Urology;  Laterality: N/A;  . Cystoscopy with retrograde pyelogram, ureteroscopy and stent placement Bilateral 01/02/2014    Procedure: CYSTOSCOPY WITH RETROGRADE PYELOGRAM BILATERAL URETERS  AND TRANSURETHRAL RESECTION OF A BLADDER TUMOR WITH MULTIPLE BX;  Surgeon: Alexis Frock, MD;  Location: [facility];  Service: Urology;  Laterality: Bilateral;  . Robot assisted laparoscopic complete cystect ileal conduit N/A 02/17/2014    Procedure: ROBOTIC ASSISTED LAPAROSCOPIC PARTIAL CYSTECTOMY;  Surgeon: Alexis Frock, MD;  Location: [facility];  Service: Urology;  Laterality: N/A;  . Lymphadenectomy Bilateral 02/17/2014    Procedure: BILATERAL LYMPH NODE DISSECTION;  Surgeon: Alexis Frock, MD;  Location: [facility];  Service: Urology;  Laterality: Bilateral;  . Cystoscopy N/A 02/17/2014    Procedure: CYSTOSCOPY WITH INDOCYANINE GREEN DYE;  Surgeon: Alexis Frock, MD;  Location: [facility];  Service: Urology;  Laterality: N/A;   No Known Allergies Prior to Admission medications   Medication Sig Start Date End Date Taking? Authorizing Provider  hydrochlorothiazide (MICROZIDE) 12.5 MG  capsule TAKE 1 CAPSULE BY MOUTH EVERY MORNING 11/21/15  Yes Wendie Agreste, MD  lidocaine-prilocaine (EMLA) cream Apply 1 application topically as needed. 05/15/15  Yes [name], MD  tamsulosin (FLOMAX) 0.4 MG CAPS capsule Take 2 capsules (0.8 mg total) by mouth daily. 05/23/15  Yes [name], MD  Tetrahydrozoline HCl (VISINE OP) Apply 1-2 drops to eye daily as needed  (redness.). Reported on 11/21/2015   Yes Historical Provider, MD   Social History   Social History  . Marital Status: Single    Spouse Name: N/A  . Number of Children: N/A  . Years of Education: N/A   Occupational History  . Driver      part time   Social History Main Topics  . Smoking status: Former Smoker -- 1.00 packs/day for 36 years    Types: Cigarettes    Start date: 03/06/1983    Quit date: 06/15/2010  . Smokeless tobacco: Never Used     Comment: quit 5 years ago  . Alcohol Use: No  . Drug Use: No  . Sexual Activity: Not on file   Other Topics Concern  . Not on file   Social History Narrative   Divorced.   Exercise: Yes      Review of Systems  Musculoskeletal:       Positive for left shoulder pain       Objective:   Physical Exam  Constitutional: He is oriented to person, place, and time. He appears well-developed and well-nourished. No distress.  HENT:  Head: Normocephalic and atraumatic.  Eyes: Conjunctivae are normal. Pupils are equal, round, and reactive to light.  Neck: Neck supple. No spinous process tenderness and no muscular tenderness present. Decreased range of motion (slight, does not reproduce shoulder pain.) present.  Cardiovascular: Normal rate.   Pulmonary/Chest: Effort normal.  Musculoskeletal:       Left shoulder: He exhibits no tenderness (no [place], clavicle, ac tenderness) and no bony tenderness.  Pt has positive hawkins and positive neer sign.  Pt has pain with empty can test and with liftoff. No weakness. NVI distally. Pt has full rotator cuff strength.  Neurological: He is alert and oriented to person, place, and time.  Skin: Skin is warm and dry.  Psychiatric: He has a normal mood and affect. His behavior is normal.  Nursing note and vitals reviewed.  Filed Vitals:   01/21/16 0836  BP: 110/62  Pulse: 83  Temp: 97.8 F (36.6 C)  TempSrc: Oral  Resp: 16  Height: 6\' 2"  (1.88 m)  Weight: 193 lb (87.544 kg)  SpO2: 98%   Dg  Shoulder Left  01/21/2016  CLINICAL DATA:  Pain.  No known injury.  Initial evaluation EXAM: LEFT SHOULDER - 2+ VIEW COMPARISON:  01/01/2014 . FINDINGS: No acute bony or joint abnormality identified. Mild degenerative change noted about the acromioclavicular glenohumeral joints. IMPRESSION: Mild acromioclavicular and glenohumeral degenerative change. Electronically Signed   By: Marcello Moores  Register   On: 01/21/2016 09:22       Assessment & Plan:   YOVANY BRANDLE is a 70 y.o. male Left shoulder pain - Plan: DG Shoulder Left, meloxicam (MOBIC) 7.5 MG tablet  Impingement syndrome of shoulder, left - Plan: DG Shoulder Left, meloxicam (MOBIC) 7.5 MG tablet  Tendinopathy of rotator cuff, left - Plan: DG Shoulder Left, meloxicam (MOBIC) 7.5 MG tablet  Suspected combination of impingement and rotator cuff tendinosis. Underlying degenerative changes as well.  -Try meloxicam 7.5 mg daily when necessary. Side  effects and cardiac risks discussed. Recheck in 2-3 weeks if not improving for possible subacromial injection. Sooner if worse.  Meds ordered this encounter  Medications  . meloxicam (MOBIC) 7.5 MG tablet    Sig: Take 1 tablet (7.5 mg total) by mouth daily.    Dispense:  30 tablet    Refill:  0   Patient Instructions       IF you received an x-ray today, you will receive an invoice from [facility]. Please contact [name] at [phone] with questions or concerns regarding your invoice.   IF you received labwork today, you will receive an invoice from [employer]. Please contact [facility] at [phone] with questions or concerns regarding your invoice.   Our billing staff will not be able to assist you with questions regarding bills from these companies.  You will be contacted with the lab results as soon as they are available. The fastest way to get your results is to activate your My Chart account. Instructions are located on the last  page of this paperwork. If you have not heard from [place] regarding the results in 2 weeks, please contact this office.    Your shoulder pain appears to be due to rotator cuff tendinosis and some impingement or bursitis. See information on this below. For now can try the exercises as listed below, and meloxicam once per day for the next 2-3 weeks at the most. If not improving by that point, return for possible injection. Sooner if worse.  Impingement Syndrome, Rotator Cuff, Bursitis With Rehab Impingement syndrome is a condition that involves inflammation of the tendons of the rotator cuff and the subacromial bursa, that causes pain in the shoulder. The rotator cuff consists of four tendons and muscles that control much of the shoulder and upper arm function. The subacromial bursa is a fluid filled sac that helps reduce friction between the rotator cuff and one of the bones of the shoulder (acromion). Impingement syndrome is usually an overuse injury that causes swelling of the bursa (bursitis), swelling of the tendon (tendonitis), and/or a tear of the tendon (strain). Strains are classified into three categories. Grade 1 strains cause pain, but the tendon is not lengthened. Grade 2 strains include a lengthened ligament, due to the ligament being stretched or partially ruptured. With grade 2 strains there is still function, although the function may be decreased. Grade 3 strains include a complete tear of the tendon or muscle, and function is usually impaired. SYMPTOMS   Pain around the shoulder, often at the outer portion of the upper arm.  Pain that gets worse with shoulder function, especially when reaching overhead or lifting.  Sometimes, aching when not using the arm.  Pain that wakes you up at night.  Sometimes, tenderness, swelling, warmth, or redness over the affected area.  Loss of strength.  Limited motion of the shoulder, especially reaching behind the back (to the back pocket or to  unhook bra) or across your body.  Crackling sound (crepitation) when moving the arm.  Biceps tendon pain and inflammation (in the front of the shoulder). Worse when bending the elbow or lifting. CAUSES  Impingement syndrome is often an overuse injury, in which chronic (repetitive) motions cause the tendons or bursa to become inflamed. A strain occurs when a force is paced on the tendon or muscle that is greater than it can withstand. Common mechanisms of injury include: Stress from sudden increase in duration, frequency, or intensity of training.  Direct hit (  trauma) to the shoulder.  Aging, erosion of the tendon with normal use.  Bony bump on shoulder (acromial spur). RISK INCREASES WITH:  Contact sports (football, wrestling, boxing).  Throwing sports (baseball, tennis, volleyball).  Weightlifting and bodybuilding.  Heavy labor.  Previous injury to the rotator cuff, including impingement.  Poor shoulder strength and flexibility.  Failure to warm up properly before activity.  Inadequate protective equipment.  Old age.  Bony bump on shoulder (acromial spur). PREVENTION   Warm up and stretch properly before activity.  Allow for adequate recovery between workouts.  Maintain physical fitness:  Strength, flexibility, and endurance.  Cardiovascular fitness.  Learn and use proper exercise technique. PROGNOSIS  If treated properly, impingement syndrome usually goes away within 6 weeks. Sometimes surgery is required.  RELATED COMPLICATIONS   Longer healing time if not properly treated, or if not given enough time to heal.  Recurring symptoms, that result in a chronic condition.  Shoulder stiffness, frozen shoulder, or loss of motion.  Rotator cuff tendon tear.  Recurring symptoms, especially if activity is resumed too soon, with overuse, with a direct blow, or when using poor technique. TREATMENT  Treatment first involves the use of ice and medicine, to reduce  pain and inflammation. The use of strengthening and stretching exercises may help reduce pain with activity. These exercises may be performed at home or with a therapist. If non-surgical treatment is unsuccessful after more than 6 months, surgery may be advised. After surgery and rehabilitation, activity is usually possible in 3 months.  MEDICATION  If pain medicine is needed, nonsteroidal anti-inflammatory medicines (aspirin and ibuprofen), or other minor pain relievers (acetaminophen), are often advised.  Do not take pain medicine for 7 days before surgery.  Prescription pain relievers may be given, if your caregiver thinks they are needed. Use only as directed and only as much as you need.  Corticosteroid injections may be given by your caregiver. These injections should be reserved for the most serious cases, because they may only be given a certain number of times. HEAT AND COLD  Cold treatment (icing) should be applied for 10 to 15 minutes every 2 to 3 hours for inflammation and pain, and immediately after activity that aggravates your symptoms. Use ice packs or an ice massage.  Heat treatment may be used before performing stretching and strengthening activities prescribed by your caregiver, physical therapist, or athletic trainer. Use a heat pack or a warm water soak. SEEK MEDICAL CARE IF:   Symptoms get worse or do not improve in 4 to 6 weeks, despite treatment.  New, unexplained symptoms develop. (Drugs used in treatment may produce side effects.) EXERCISES  RANGE OF MOTION (ROM) AND STRETCHING EXERCISES - Impingement Syndrome (Rotator Cuff  Tendinitis, Bursitis) These exercises may help you when beginning to rehabilitate your injury. Your symptoms may go away with or without further involvement from your physician, physical therapist or athletic trainer. While completing these exercises, remember:   Restoring tissue flexibility helps normal motion to return to the joints. This  allows healthier, less painful movement and activity.  An effective stretch should be held for at least 30 seconds.  A stretch should never be painful. You should only feel a gentle lengthening or release in the stretched tissue. STRETCH - Flexion, Standing  Stand with good posture. With an underhand grip on your right / left hand, and an overhand grip on the opposite hand, grasp a broomstick or cane so that your hands are a little more than shoulder  width apart.  Keeping your right / left elbow straight and shoulder muscles relaxed, push the stick with your opposite hand, to raise your right / left arm in front of your body and then overhead. Raise your arm until you feel a stretch in your right / left shoulder, but before you have increased shoulder pain.  Try to avoid shrugging your right / left shoulder as your arm rises, by keeping your shoulder blade tucked down and toward your mid-back spine. Hold for __________ seconds.  Slowly return to the starting position. Repeat __________ times. Complete this exercise __________ times per day. STRETCH - Abduction, Supine  Lie on your back. With an underhand grip on your right / left hand and an overhand grip on the opposite hand, grasp a broomstick or cane so that your hands are a little more than shoulder width apart.  Keeping your right / left elbow straight and your shoulder muscles relaxed, push the stick with your opposite hand, to raise your right / left arm out to the side of your body and then overhead. Raise your arm until you feel a stretch in your right / left shoulder, but before you have increased shoulder pain.  Try to avoid shrugging your right / left shoulder as your arm rises, by keeping your shoulder blade tucked down and toward your mid-back spine. Hold for __________ seconds.  Slowly return to the starting position. Repeat __________ times. Complete this exercise __________ times per day. ROM - Flexion,  Active-Assisted  Lie on your back. You may bend your knees for comfort.  Grasp a broomstick or cane so your hands are about shoulder width apart. Your right / left hand should grip the end of the stick, so that your hand is positioned "thumbs-up," as if you were about to shake hands.  Using your healthy arm to lead, raise your right / left arm overhead, until you feel a gentle stretch in your shoulder. Hold for __________ seconds.  Use the stick to assist in returning your right / left arm to its starting position. Repeat __________ times. Complete this exercise __________ times per day.  ROM - Internal Rotation, Supine   Lie on your back on a firm surface. Place your right / left elbow about 60 degrees away from your side. Elevate your elbow with a folded towel, so that the elbow and shoulder are the same height.  Using a broomstick or cane and your strong arm, pull your right / left hand toward your body until you feel a gentle stretch, but no increase in your shoulder pain. Keep your shoulder and elbow in place throughout the exercise.  Hold for __________ seconds. Slowly return to the starting position. Repeat __________ times. Complete this exercise __________ times per day. STRETCH - Internal Rotation  Place your right / left hand behind your back, palm up.  Throw a towel or belt over your opposite shoulder. Grasp the towel with your right / left hand.  While keeping an upright posture, gently pull up on the towel, until you feel a stretch in the front of your right / left shoulder.  Avoid shrugging your right / left shoulder as your arm rises, by keeping your shoulder blade tucked down and toward your mid-back spine.  Hold for __________ seconds. Release the stretch, by lowering your healthy hand. Repeat __________ times. Complete this exercise __________ times per day. ROM - Internal Rotation   Using an underhand grip, grasp a stick behind your back with both  hands.  While  standing upright with good posture, slide the stick up your back until you feel a mild stretch in the front of your shoulder.  Hold for __________ seconds. Slowly return to your starting position. Repeat __________ times. Complete this exercise __________ times per day.  STRETCH - Posterior Shoulder Capsule   Stand or sit with good posture. Grasp your right / left elbow and draw it across your chest, keeping it at the same height as your shoulder.  Pull your elbow, so your upper arm comes in closer to your chest. Pull until you feel a gentle stretch in the back of your shoulder.  Hold for __________ seconds. Repeat __________ times. Complete this exercise __________ times per day. STRENGTHENING EXERCISES - Impingement Syndrome (Rotator Cuff Tendinitis, Bursitis) These exercises may help you when beginning to rehabilitate your injury. They may resolve your symptoms with or without further involvement from your physician, physical therapist or athletic trainer. While completing these exercises, remember:  Muscles can gain both the endurance and the strength needed for everyday activities through controlled exercises.  Complete these exercises as instructed by your physician, physical therapist or athletic trainer. Increase the resistance and repetitions only as guided.  You may experience muscle soreness or fatigue, but the pain or discomfort you are trying to eliminate should never worsen during these exercises. If this pain does get worse, stop and make sure you are following the directions exactly. If the pain is still present after adjustments, discontinue the exercise until you can discuss the trouble with your clinician.  During your recovery, avoid activity or exercises which involve actions that place your injured hand or elbow above your head or behind your back or head. These positions stress the tissues which you are trying to heal. STRENGTH - Scapular Depression and Adduction   With  good posture, sit on a firm chair. Support your arms in front of you, with pillows, arm rests, or on a table top. Have your elbows in line with the sides of your body.  Gently draw your shoulder blades down and toward your mid-back spine. Gradually increase the tension, without tensing the muscles along the top of your shoulders and the back of your neck.  Hold for __________ seconds. Slowly release the tension and relax your muscles completely before starting the next repetition.  After you have practiced this exercise, remove the arm support and complete the exercise in standing as well as sitting position. Repeat __________ times. Complete this exercise __________ times per day.  STRENGTH - Shoulder Abductors, Isometric  With good posture, stand or sit about 4-6 inches from a wall, with your right / left side facing the wall.  Bend your right / left elbow. Gently press your right / left elbow into the wall. Increase the pressure gradually, until you are pressing as hard as you can, without shrugging your shoulder or increasing any shoulder discomfort.  Hold for __________ seconds.  Release the tension slowly. Relax your shoulder muscles completely before you begin the next repetition. Repeat __________ times. Complete this exercise __________ times per day.  STRENGTH - External Rotators, Isometric  Keep your right / left elbow at your side and bend it 90 degrees.  Step into a door frame so that the outside of your right / left wrist can press against the door frame without your upper arm leaving your side.  Gently press your right / left wrist into the door frame, as if you were trying to swing the  back of your hand away from your stomach. Gradually increase the tension, until you are pressing as hard as you can, without shrugging your shoulder or increasing any shoulder discomfort.  Hold for __________ seconds.  Release the tension slowly. Relax your shoulder muscles completely before  you begin the next repetition. Repeat __________ times. Complete this exercise __________ times per day.  STRENGTH - Supraspinatus   Stand or sit with good posture. Grasp a __________ weight, or an exercise band or tubing, so that your hand is "thumbs-up," like you are shaking hands.  Slowly lift your right / left arm in a "V" away from your thigh, diagonally into the space between your side and straight ahead. Lift your hand to shoulder height or as far as you can, without increasing any shoulder pain. At first, many people do not lift their hands above shoulder height.  Avoid shrugging your right / left shoulder as your arm rises, by keeping your shoulder blade tucked down and toward your mid-back spine.  Hold for __________ seconds. Control the descent of your hand, as you slowly return to your starting position. Repeat __________ times. Complete this exercise __________ times per day.  STRENGTH - External Rotators  Secure a rubber exercise band or tubing to a fixed object (table, pole) so that it is at the same height as your right / left elbow when you are standing or sitting on a firm surface.  Stand or sit so that the secured exercise band is at your uninjured side.  Bend your right / left elbow 90 degrees. Place a folded towel or small pillow under your right / left arm, so that your elbow is a few inches away from your side.  Keeping the tension on the exercise band, pull it away from your body, as if pivoting on your elbow. Be sure to keep your body steady, so that the movement is coming only from your rotating shoulder.  Hold for __________ seconds. Release the tension in a controlled manner, as you return to the starting position. Repeat __________ times. Complete this exercise __________ times per day.  STRENGTH - Internal Rotators   Secure a rubber exercise band or tubing to a fixed object (table, pole) so that it is at the same height as your right / left elbow when you are  standing or sitting on a firm surface.  Stand or sit so that the secured exercise band is at your right / left side.  Bend your elbow 90 degrees. Place a folded towel or small pillow under your right / left arm so that your elbow is a few inches away from your side.  Keeping the tension on the exercise band, pull it across your body, toward your stomach. Be sure to keep your body steady, so that the movement is coming only from your rotating shoulder.  Hold for __________ seconds. Release the tension in a controlled manner, as you return to the starting position. Repeat __________ times. Complete this exercise __________ times per day.  STRENGTH - Scapular Protractors, Standing   Stand arms length away from a wall. Place your hands on the wall, keeping your elbows straight.  Begin by dropping your shoulder blades down and toward your mid-back spine.  To strengthen your protractors, keep your shoulder blades down, but slide them forward on your rib cage. It will feel as if you are lifting the back of your rib cage away from the wall. This is a subtle motion and can be challenging  to complete. Ask your caregiver for further instruction, if you are not sure you are doing the exercise correctly.  Hold for __________ seconds. Slowly return to the starting position, resting the muscles completely before starting the next repetition. Repeat __________ times. Complete this exercise __________ times per day. STRENGTH - Scapular Protractors, Supine  Lie on your back on a firm surface. Extend your right / left arm straight into the air while holding a __________ weight in your hand.  Keeping your head and back in place, lift your shoulder off the floor.  Hold for __________ seconds. Slowly return to the starting position, and allow your muscles to relax completely before starting the next repetition. Repeat __________ times. Complete this exercise __________ times per day. STRENGTH - Scapular  Protractors, Quadruped  Get onto your hands and knees, with your shoulders directly over your hands (or as close as you can be, comfortably).  Keeping your elbows locked, lift the back of your rib cage up into your shoulder blades, so your mid-back rounds out. Keep your neck muscles relaxed.  Hold this position for __________ seconds. Slowly return to the starting position and allow your muscles to relax completely before starting the next repetition. Repeat __________ times. Complete this exercise __________ times per day.  STRENGTH - Scapular Retractors  Secure a rubber exercise band or tubing to a fixed object (table, pole), so that it is at the height of your shoulders when you are either standing, or sitting on a firm armless chair.  With a palm down grip, grasp an end of the band in each hand. Straighten your elbows and lift your hands straight in front of you, at shoulder height. Step back, away from the secured end of the band, until it becomes tense.  Squeezing your shoulder blades together, draw your elbows back toward your sides, as you bend them. Keep your upper arms lifted away from your body throughout the exercise.  Hold for __________ seconds. Slowly ease the tension on the band, as you reverse the directions and return to the starting position. Repeat __________ times. Complete this exercise __________ times per day. STRENGTH - Shoulder Extensors   Secure a rubber exercise band or tubing to a fixed object (table, pole) so that it is at the height of your shoulders when you are either standing, or sitting on a firm armless chair.  With a thumbs-up grip, grasp an end of the band in each hand. Straighten your elbows and lift your hands straight in front of you, at shoulder height. Step back, away from the secured end of the band, until it becomes tense.  Squeezing your shoulder blades together, pull your hands down to the sides of your thighs. Do not allow your hands to go  behind you.  Hold for __________ seconds. Slowly ease the tension on the band, as you reverse the directions and return to the starting position. Repeat __________ times. Complete this exercise __________ times per day.  STRENGTH - Scapular Retractors and External Rotators   Secure a rubber exercise band or tubing to a fixed object (table, pole) so that it is at the height as your shoulders, when you are either standing, or sitting on a firm armless chair.  With a palm down grip, grasp an end of the band in each hand. Bend your elbows 90 degrees and lift your elbows to shoulder height, at your sides. Step back, away from the secured end of the band, until it becomes tense.  Squeezing your  shoulder blades together, rotate your shoulders so that your upper arms and elbows remain stationary, but your fists travel upward to head height.  Hold for __________ seconds. Slowly ease the tension on the band, as you reverse the directions and return to the starting position. Repeat __________ times. Complete this exercise __________ times per day.  STRENGTH - Scapular Retractors and External Rotators, Rowing   Secure a rubber exercise band or tubing to a fixed object (table, pole) so that it is at the height of your shoulders, when you are either standing, or sitting on a firm armless chair.  With a palm down grip, grasp an end of the band in each hand. Straighten your elbows and lift your hands straight in front of you, at shoulder height. Step back, away from the secured end of the band, until it becomes tense.  Step 1: Squeeze your shoulder blades together. Bending your elbows, draw your hands to your chest, as if you are rowing a boat. At the end of this motion, your hands and elbow should be at shoulder height and your elbows should be out to your sides.  Step 2: Rotate your shoulders, to raise your hands above your head. Your forearms should be vertical and your upper arms should be  horizontal.  Hold for __________ seconds. Slowly ease the tension on the band, as you reverse the directions and return to the starting position. Repeat __________ times. Complete this exercise __________ times per day.  STRENGTH - Scapular Depressors  Find a sturdy chair without wheels, such as a dining room chair.  Keeping your feet on the floor, and your hands on the chair arms, lift your bottom up from the seat, and lock your elbows.  Keeping your elbows straight, allow gravity to pull your body weight down. Your shoulders will rise toward your ears.  Raise your body against gravity by drawing your shoulder blades down your back, shortening the distance between your shoulders and ears. Although your feet should always maintain contact with the floor, your feet should progressively support less body weight, as you get stronger.  Hold for __________ seconds. In a controlled and slow manner, lower your body weight to begin the next repetition. Repeat __________ times. Complete this exercise __________ times per day.    This information is not intended to replace advice given to you by your health care provider. Make sure you discuss any questions you have with your health care provider.   Document Released: 09/15/2005 Document Revised: 10/06/2014 Document Reviewed: 12/28/2008 Elsevier Interactive Patient Education [employer].     I personally performed the services described in this documentation, which was scribed in my presence. The recorded information has been reviewed and considered, and addended by me as needed.

## 2016-01-28 ENCOUNTER — Ambulatory Visit (HOSPITAL_BASED_OUTPATIENT_CLINIC_OR_DEPARTMENT_OTHER): Payer: Medicare Other

## 2016-01-28 ENCOUNTER — Other Ambulatory Visit (HOSPITAL_BASED_OUTPATIENT_CLINIC_OR_DEPARTMENT_OTHER): Payer: Medicare Other

## 2016-01-28 ENCOUNTER — Encounter: Payer: Self-pay | Admitting: Hematology & Oncology

## 2016-01-28 ENCOUNTER — Ambulatory Visit (HOSPITAL_BASED_OUTPATIENT_CLINIC_OR_DEPARTMENT_OTHER): Payer: Medicare Other | Admitting: Hematology & Oncology

## 2016-01-28 VITALS — BP 123/78 | HR 72 | Temp 98.2°F | Resp 18 | Wt 190.0 lb

## 2016-01-28 DIAGNOSIS — C7951 Secondary malignant neoplasm of bone: Secondary | ICD-10-CM

## 2016-01-28 DIAGNOSIS — C775 Secondary and unspecified malignant neoplasm of intrapelvic lymph nodes: Secondary | ICD-10-CM | POA: Diagnosis not present

## 2016-01-28 DIAGNOSIS — R319 Hematuria, unspecified: Secondary | ICD-10-CM

## 2016-01-28 DIAGNOSIS — Z5111 Encounter for antineoplastic chemotherapy: Secondary | ICD-10-CM

## 2016-01-28 DIAGNOSIS — M25551 Pain in right hip: Secondary | ICD-10-CM | POA: Diagnosis not present

## 2016-01-28 DIAGNOSIS — C679 Malignant neoplasm of bladder, unspecified: Secondary | ICD-10-CM | POA: Diagnosis not present

## 2016-01-28 DIAGNOSIS — C779 Secondary and unspecified malignant neoplasm of lymph node, unspecified: Secondary | ICD-10-CM

## 2016-01-28 LAB — CBC WITH DIFFERENTIAL (CANCER CENTER ONLY)
BASO#: 0 10*3/uL (ref 0.0–0.2)
BASO%: 0.2 % (ref 0.0–2.0)
EOS%: 3.6 % (ref 0.0–7.0)
Eosinophils Absolute: 0.2 10*3/uL (ref 0.0–0.5)
HEMATOCRIT: 30.6 % — AB (ref 38.7–49.9)
HEMOGLOBIN: 10 g/dL — AB (ref 13.0–17.1)
LYMPH#: 1.2 10*3/uL (ref 0.9–3.3)
LYMPH%: 28.3 % (ref 14.0–48.0)
MCH: 34.5 pg — ABNORMAL HIGH (ref 28.0–33.4)
MCHC: 32.7 g/dL (ref 32.0–35.9)
MCV: 106 fL — ABNORMAL HIGH (ref 82–98)
MONO#: 0.9 10*3/uL (ref 0.1–0.9)
MONO%: 21.3 % — AB (ref 0.0–13.0)
NEUT%: 46.6 % (ref 40.0–80.0)
NEUTROS ABS: 1.9 10*3/uL (ref 1.5–6.5)
Platelets: 133 10*3/uL — ABNORMAL LOW (ref 145–400)
RBC: 2.9 10*6/uL — ABNORMAL LOW (ref 4.20–5.70)
RDW: 15.8 % — ABNORMAL HIGH (ref 11.1–15.7)
WBC: 4.1 10*3/uL (ref 4.0–10.0)

## 2016-01-28 LAB — IRON AND TIBC
%SAT: 15 % — AB (ref 20–55)
IRON: 51 ug/dL (ref 42–163)
TIBC: 348 ug/dL (ref 202–409)
UIBC: 297 ug/dL (ref 117–376)

## 2016-01-28 LAB — CMP (CANCER CENTER ONLY)
ALBUMIN: 3.1 g/dL — AB (ref 3.3–5.5)
ALK PHOS: 54 U/L (ref 26–84)
ALT: 44 U/L (ref 10–47)
AST: 49 U/L — AB (ref 11–38)
BILIRUBIN TOTAL: 1 mg/dL (ref 0.20–1.60)
BUN, Bld: 13 mg/dL (ref 7–22)
CALCIUM: 8.8 mg/dL (ref 8.0–10.3)
CO2: 29 mEq/L (ref 18–33)
CREATININE: 1 mg/dL (ref 0.6–1.2)
Chloride: 103 mEq/L (ref 98–108)
GLUCOSE: 94 mg/dL (ref 73–118)
Potassium: 3.8 mEq/L (ref 3.3–4.7)
SODIUM: 139 meq/L (ref 128–145)
Total Protein: 7.9 g/dL (ref 6.4–8.1)

## 2016-01-28 LAB — CHCC SATELLITE - SMEAR

## 2016-01-28 LAB — LACTATE DEHYDROGENASE: LDH: 182 U/L (ref 125–245)

## 2016-01-28 LAB — FERRITIN: FERRITIN: 122 ng/mL (ref 22–316)

## 2016-01-28 LAB — RETICULOCYTES: Reticulocyte Count: 2.6 % (ref 0.6–2.6)

## 2016-01-28 MED ORDER — SODIUM CHLORIDE 0.9 % IJ SOLN
10.0000 mL | INTRAMUSCULAR | Status: DC | PRN
  Administered 2016-01-28: 10 mL
  Filled 2016-01-28: qty 10

## 2016-01-28 MED ORDER — SODIUM CHLORIDE 0.9 % IV SOLN
Freq: Once | INTRAVENOUS | Status: AC
  Administered 2016-01-28: 11:00:00 via INTRAVENOUS

## 2016-01-28 MED ORDER — HEPARIN SOD (PORK) LOCK FLUSH 100 UNIT/ML IV SOLN
500.0000 [IU] | Freq: Once | INTRAVENOUS | Status: AC | PRN
  Administered 2016-01-28: 500 [IU]
  Filled 2016-01-28: qty 5

## 2016-01-28 MED ORDER — SODIUM CHLORIDE 0.9 % IV SOLN
441.5000 mg | Freq: Once | INTRAVENOUS | Status: AC
  Administered 2016-01-28: 440 mg via INTRAVENOUS
  Filled 2016-01-28: qty 44

## 2016-01-28 MED ORDER — GEMCITABINE HCL CHEMO INJECTION 1 GM/26.3ML
640.0000 mg/m2 | Freq: Once | INTRAVENOUS | Status: AC
  Administered 2016-01-28: 1330 mg via INTRAVENOUS
  Filled 2016-01-28: qty 8.68

## 2016-01-28 MED ORDER — CARBOPLATIN CHEMO INTRADERMAL TEST DOSE 100MCG/0.02ML
100.0000 ug | Freq: Once | INTRADERMAL | Status: AC
  Administered 2016-01-28: 100 ug via INTRADERMAL
  Filled 2016-01-28: qty 0.01

## 2016-01-28 MED ORDER — SODIUM CHLORIDE 0.9 % IV SOLN
Freq: Once | INTRAVENOUS | Status: AC
  Administered 2016-01-28: 12:00:00 via INTRAVENOUS
  Filled 2016-01-28: qty 8

## 2016-01-28 NOTE — Addendum Note (Signed)
Addended by: Jaci Carrel A on: 01/28/2016 11:10 AM   Modules accepted: Orders

## 2016-01-28 NOTE — Patient Instructions (Signed)
Carboplatin injection What is this medicine? CARBOPLATIN (KAR boe pla tin) is a chemotherapy drug. It targets fast dividing cells, like cancer cells, and causes these cells to die. This medicine is used to treat ovarian cancer and many other cancers. This medicine may be used for other purposes; ask your health care provider or pharmacist if you have questions. What should I tell my health care provider before I take this medicine? They need to know if you have any of these conditions: -blood disorders -hearing problems -kidney disease -recent or ongoing radiation therapy -an unusual or allergic reaction to carboplatin, cisplatin, other chemotherapy, other medicines, foods, dyes, or preservatives -pregnant or trying to get pregnant -breast-feeding How should I use this medicine? This drug is usually given as an infusion into a vein. It is administered in a hospital or clinic by a specially trained health care professional. Talk to your pediatrician regarding the use of this medicine in children. Special care may be needed. Overdosage: If you think you have taken too much of this medicine contact a poison control center or emergency room at once. NOTE: This medicine is only for you. Do not share this medicine with others. What if I miss a dose? It is important not to miss a dose. Call your doctor or health care professional if you are unable to keep an appointment. What may interact with this medicine? -medicines for seizures -medicines to increase blood counts like filgrastim, pegfilgrastim, sargramostim -some antibiotics like amikacin, gentamicin, neomycin, streptomycin, tobramycin -vaccines Talk to your doctor or health care professional before taking any of these medicines: -acetaminophen -aspirin -ibuprofen -ketoprofen -naproxen This list may not describe all possible interactions. Give your health care provider a list of all the medicines, herbs, non-prescription drugs, or dietary  supplements you use. Also tell them if you smoke, drink alcohol, or use illegal drugs. Some items may interact with your medicine. What should I watch for while using this medicine? Your condition will be monitored carefully while you are receiving this medicine. You will need important blood work done while you are taking this medicine. This drug may make you feel generally unwell. This is not uncommon, as chemotherapy can affect healthy cells as well as cancer cells. Report any side effects. Continue your course of treatment even though you feel ill unless your doctor tells you to stop. In some cases, you may be given additional medicines to help with side effects. Follow all directions for their use. Call your doctor or health care professional for advice if you get a fever, chills or sore throat, or other symptoms of a cold or flu. Do not treat yourself. This drug decreases your body's ability to fight infections. Try to avoid being around people who are sick. This medicine may increase your risk to bruise or bleed. Call your doctor or health care professional if you notice any unusual bleeding. Be careful brushing and flossing your teeth or using a toothpick because you may get an infection or bleed more easily. If you have any dental work done, tell your dentist you are receiving this medicine. Avoid taking products that contain aspirin, acetaminophen, ibuprofen, naproxen, or ketoprofen unless instructed by your doctor. These medicines may hide a fever. Do not become pregnant while taking this medicine. Women should inform their doctor if they wish to become pregnant or think they might be pregnant. There is a potential for serious side effects to an unborn child. Talk to your health care professional or pharmacist for more information.   Do not breast-feed an infant while taking this medicine. What side effects may I notice from receiving this medicine? Side effects that you should report to your  doctor or health care professional as soon as possible: -allergic reactions like skin rash, itching or hives, swelling of the face, lips, or tongue -signs of infection - fever or chills, cough, sore throat, pain or difficulty passing urine -signs of decreased platelets or bleeding - bruising, pinpoint red spots on the skin, black, tarry stools, nosebleeds -signs of decreased red blood cells - unusually weak or tired, fainting spells, lightheadedness -breathing problems -changes in hearing -changes in vision -chest pain -high blood pressure -low blood counts - This drug may decrease the number of white blood cells, red blood cells and platelets. You may be at increased risk for infections and bleeding. -nausea and vomiting -pain, swelling, redness or irritation at the injection site -pain, tingling, numbness in the hands or feet -problems with balance, talking, walking -trouble passing urine or change in the amount of urine Side effects that usually do not require medical attention (report to your doctor or health care professional if they continue or are bothersome): -hair loss -loss of appetite -metallic taste in the mouth or changes in taste This list may not describe all possible side effects. Call your doctor for medical advice about side effects. You may report side effects to FDA at 1-800-FDA-1088. Where should I keep my medicine? This drug is given in a hospital or clinic and will not be stored at home. NOTE: This sheet is a summary. It may not cover all possible information. If you have questions about this medicine, talk to your doctor, pharmacist, or health care provider.    2016, [employer]. (2007-12-21 14:38:05) Gemcitabine injection What is this medicine? GEMCITABINE (jem SIT a been) is a chemotherapy drug. This medicine is used to treat many types of cancer like breast cancer, lung cancer, pancreatic cancer, and ovarian cancer. This medicine may be used for  other purposes; ask your health care provider or pharmacist if you have questions. What should I tell my health care provider before I take this medicine? They need to know if you have any of these conditions: -blood disorders -infection -kidney disease -liver disease -recent or ongoing radiation therapy -an unusual or allergic reaction to gemcitabine, other chemotherapy, other medicines, foods, dyes, or preservatives -pregnant or trying to get pregnant -breast-feeding How should I use this medicine? This drug is given as an infusion into a vein. It is administered in a hospital or clinic by a specially trained health care professional. Talk to your pediatrician regarding the use of this medicine in children. Special care may be needed. Overdosage: If you think you have taken too much of this medicine contact a poison control center or emergency room at once. NOTE: This medicine is only for you. Do not share this medicine with others. What if I miss a dose? It is important not to miss your dose. Call your doctor or health care professional if you are unable to keep an appointment. What may interact with this medicine? -medicines to increase blood counts like filgrastim, pegfilgrastim, sargramostim -some other chemotherapy drugs like cisplatin -vaccines Talk to your doctor or health care professional before taking any of these medicines: -acetaminophen -aspirin -ibuprofen -ketoprofen -naproxen This list may not describe all possible interactions. Give your health care provider a list of all the medicines, herbs, non-prescription drugs, or dietary supplements you use. Also tell them if you smoke, drink   alcohol, or use illegal drugs. Some items may interact with your medicine. What should I watch for while using this medicine? Visit your doctor for checks on your progress. This drug may make you feel generally unwell. This is not uncommon, as chemotherapy can affect healthy cells as well  as cancer cells. Report any side effects. Continue your course of treatment even though you feel ill unless your doctor tells you to stop. In some cases, you may be given additional medicines to help with side effects. Follow all directions for their use. Call your doctor or health care professional for advice if you get a fever, chills or sore throat, or other symptoms of a cold or flu. Do not treat yourself. This drug decreases your body's ability to fight infections. Try to avoid being around people who are sick. This medicine may increase your risk to bruise or bleed. Call your doctor or health care professional if you notice any unusual bleeding. Be careful brushing and flossing your teeth or using a toothpick because you may get an infection or bleed more easily. If you have any dental work done, tell your dentist you are receiving this medicine. Avoid taking products that contain aspirin, acetaminophen, ibuprofen, naproxen, or ketoprofen unless instructed by your doctor. These medicines may hide a fever. Women should inform their doctor if they wish to become pregnant or think they might be pregnant. There is a potential for serious side effects to an unborn child. Talk to your health care professional or pharmacist for more information. Do not breast-feed an infant while taking this medicine. What side effects may I notice from receiving this medicine? Side effects that you should report to your doctor or health care professional as soon as possible: -allergic reactions like skin rash, itching or hives, swelling of the face, lips, or tongue -low blood counts - this medicine may decrease the number of white blood cells, red blood cells and platelets. You may be at increased risk for infections and bleeding. -signs of infection - fever or chills, cough, sore throat, pain or difficulty passing urine -signs of decreased platelets or bleeding - bruising, pinpoint red spots on the skin, black, tarry  stools, blood in the urine -signs of decreased red blood cells - unusually weak or tired, fainting spells, lightheadedness -breathing problems -chest pain -mouth sores -nausea and vomiting -pain, swelling, redness at site where injected -pain, tingling, numbness in the hands or feet -stomach pain -swelling of ankles, feet, hands -unusual bleeding Side effects that usually do not require medical attention (report to your doctor or health care professional if they continue or are bothersome): -constipation -diarrhea -hair loss -loss of appetite -stomach upset This list may not describe all possible side effects. Call your doctor for medical advice about side effects. You may report side effects to FDA at 1-800-FDA-1088. Where should I keep my medicine? This drug is given in a hospital or clinic and will not be stored at home. NOTE: This sheet is a summary. It may not cover all possible information. If you have questions about this medicine, talk to your doctor, pharmacist, or health care provider.    2016, [employer]. (2008-01-25 18:45:54)  

## 2016-01-28 NOTE — Progress Notes (Signed)
Hematology and Oncology Follow Up Visit  Dustin Jordan [id number] Dec 07, 1945 70 y.o. 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 He had started having some pain in the left shoulder. He went to see his family doctor. He is given some meloxicam. This seemed to help the left shoulder but then his right hip began to hurt. He thought that it might be from the meloxicam. I told him that I did not think that that was the case.  He's had no weakness in the right hip or leg. He's had no change in bowel or bladder habits. He's had no cough. Had no leg swelling. He's had no fever. He's had no mouth sores.  This morning, the pain is not all that prominent.  Overall, his performance status is ECOG 1.  Medications:    Medication List       This list is accurate as of: 01/28/16 10:12 AM.  Always use your most recent med list.               hydrochlorothiazide 12.5 MG capsule  Commonly known as:  MICROZIDE  TAKE 1 CAPSULE BY MOUTH EVERY MORNING     lidocaine-prilocaine cream  Commonly known as:  EMLA  Apply 1 application topically as needed.     meloxicam 7.5 MG tablet  Commonly known as:  MOBIC  Take 1 tablet (7.5 mg total) by mouth daily.     tamsulosin 0.4 MG Caps capsule  Commonly known as:  FLOMAX  Take 2 capsules (0.8 mg total) by mouth daily.     VISINE OP  Apply 1-2 drops to eye daily as needed (redness.). Reported on 11/21/2015        Allergies: No Known Allergies  Past Medical History, Surgical history, Social history, and Family History were reviewed and updated.  Review of Systems: All other 10 point review of systems is negative.   Physical Exam:  weight is 190 lb (86.183 kg). His oral temperature is 98.2  F (36.8 C). His blood pressure is 123/78 and his pulse is 72. His respiration is 18.   Wt Readings from Last 3 Encounters:  01/28/16 190 lb (86.183 kg)  01/21/16 193 lb (87.544 kg)  01/07/16 197 lb (89.359 kg)    Well-developed well-nourished Afro-American male in no obvious distress. Head and neck exam shows no ocular or oral lesion. He has no palpable cervical or supraclavicular lymph nodes. Lungs are clear. Cardiac exam regular rate and rhythm with no murmurs, rubs or bruits. Abdomen is soft. He has good bowel sounds. There is no fluid wave. There is no palpable liver or spleen tip. Back exam shows no tenderness over the spine, ribs or hips. He has good range of motion of the right hip. Extremities shows no clubbing, cyanosis or edema. He does have good peripheral pulses. No Homans sign is noted. Neurological exam shows no focal neurological deficits. Skin exam shows no rashes, ecchymoses or petechia.  Lab Results  Component Value Date   WBC 4.1 01/28/2016   HGB 10.0* 01/28/2016   HCT 30.6* 01/28/2016   MCV 106* 01/28/2016   PLT 133* 01/28/2016   No results found for: FERRITIN, IRON, TIBC, UIBC, IRONPCTSAT Lab Results  Component  Value Date   RBC 2.90* 01/28/2016   No results found for: KPAFRELGTCHN, LAMBDASER, KAPLAMBRATIO No results found for: IGGSERUM, IGA, IGMSERUM No results found for: Odetta Pink, SPEI   Chemistry      Component Value Date/Time   NA 139 01/28/2016 0904   NA 140 11/27/2015 0805   K 3.8 01/28/2016 0904   K 4.8 11/27/2015 0805   CL 103 01/28/2016 0904   CL 109 11/27/2015 0805   CO2 29 01/28/2016 0904   CO2 24 11/27/2015 0805   BUN 13 01/28/2016 0904   BUN 20 11/27/2015 0805   CREATININE 1.0 01/28/2016 0904   CREATININE 1.14 01/19/2015 1210      Component Value Date/Time   CALCIUM 8.8 01/28/2016 0904   CALCIUM 8.9 11/27/2015 0805   ALKPHOS 54 01/28/2016 0904   ALKPHOS 35* 11/27/2015 0805   AST  49* 01/28/2016 0904   AST 19 11/27/2015 0805   ALT 44 01/28/2016 0904   ALT 11 11/27/2015 0805   BILITOT 1.00 01/28/2016 0904   BILITOT 0.5 11/27/2015 0805     Impression and Plan: Dustin Jordan is very pleasant 70 yo African American male with poorly differentiated metastatic bladder cancer.   IAm not sure what this pain in the right hip is. On my physical: Exam, I did a good right hip range of motion and I cannot find any issues. There is no pain with range of motion. There is no pain with palpation of the right hip.  I do know that he does have disease in the iliac area. I just wonder if this might not be causing his issues.  I will set him up with another PET scan. His last one was in February.  I will see him back in 3 weeks.   I spent about 30 minutes with him today.  [name], MD 5/1/201710:12 AM

## 2016-02-04 ENCOUNTER — Encounter (HOSPITAL_COMMUNITY)
Admission: RE | Admit: 2016-02-04 | Discharge: 2016-02-04 | Disposition: A | Discharge status: Home / Self Care | Payer: Medicare Other | Source: Ambulatory Visit | Attending: Hematology & Oncology | Admitting: Hematology & Oncology

## 2016-02-04 DIAGNOSIS — C775 Secondary and unspecified malignant neoplasm of intrapelvic lymph nodes: Secondary | ICD-10-CM | POA: Insufficient documentation

## 2016-02-04 DIAGNOSIS — C679 Malignant neoplasm of bladder, unspecified: Secondary | ICD-10-CM | POA: Diagnosis not present

## 2016-02-04 LAB — GLUCOSE, CAPILLARY: Glucose-Capillary: 85 mg/dL (ref 65–99)

## 2016-02-04 MED ORDER — FLUDEOXYGLUCOSE F - 18 (FDG) INJECTION
9.2600 | Freq: Once | INTRAVENOUS | Status: AC | PRN
  Administered 2016-02-04: 9.26 via INTRAVENOUS

## 2016-02-18 ENCOUNTER — Other Ambulatory Visit: Payer: Medicare Other

## 2016-02-18 ENCOUNTER — Ambulatory Visit: Payer: Medicare Other | Admitting: Family

## 2016-02-18 ENCOUNTER — Ambulatory Visit: Payer: Medicare Other

## 2016-02-26 ENCOUNTER — Ambulatory Visit (HOSPITAL_BASED_OUTPATIENT_CLINIC_OR_DEPARTMENT_OTHER): Payer: Medicare Other

## 2016-02-26 ENCOUNTER — Encounter: Payer: Self-pay | Admitting: Family

## 2016-02-26 ENCOUNTER — Ambulatory Visit (HOSPITAL_BASED_OUTPATIENT_CLINIC_OR_DEPARTMENT_OTHER): Payer: Medicare Other | Admitting: Family

## 2016-02-26 ENCOUNTER — Other Ambulatory Visit (HOSPITAL_BASED_OUTPATIENT_CLINIC_OR_DEPARTMENT_OTHER): Payer: Medicare Other

## 2016-02-26 VITALS — BP 140/73 | HR 70 | Temp 97.8°F | Resp 20 | Ht 74.0 in | Wt 194.0 lb

## 2016-02-26 DIAGNOSIS — Z79899 Other long term (current) drug therapy: Secondary | ICD-10-CM | POA: Diagnosis not present

## 2016-02-26 DIAGNOSIS — C679 Malignant neoplasm of bladder, unspecified: Secondary | ICD-10-CM

## 2016-02-26 DIAGNOSIS — C779 Secondary and unspecified malignant neoplasm of lymph node, unspecified: Secondary | ICD-10-CM

## 2016-02-26 DIAGNOSIS — Z5112 Encounter for antineoplastic immunotherapy: Secondary | ICD-10-CM | POA: Diagnosis not present

## 2016-02-26 DIAGNOSIS — C775 Secondary and unspecified malignant neoplasm of intrapelvic lymph nodes: Principal | ICD-10-CM

## 2016-02-26 DIAGNOSIS — C7951 Secondary malignant neoplasm of bone: Secondary | ICD-10-CM

## 2016-02-26 DIAGNOSIS — M25551 Pain in right hip: Secondary | ICD-10-CM | POA: Diagnosis not present

## 2016-02-26 LAB — CMP (CANCER CENTER ONLY)
ALT: 44 U/L (ref 10–47)
AST: 58 U/L — AB (ref 11–38)
Albumin: 3.2 g/dL — ABNORMAL LOW (ref 3.3–5.5)
Alkaline Phosphatase: 60 U/L (ref 26–84)
BILIRUBIN TOTAL: 0.6 mg/dL (ref 0.20–1.60)
BUN: 15 mg/dL (ref 7–22)
CALCIUM: 9.7 mg/dL (ref 8.0–10.3)
CO2: 27 meq/L (ref 18–33)
Chloride: 103 mEq/L (ref 98–108)
Creat: 1.2 mg/dl (ref 0.6–1.2)
GLUCOSE: 105 mg/dL (ref 73–118)
Potassium: 3.6 mEq/L (ref 3.3–4.7)
SODIUM: 142 meq/L (ref 128–145)
Total Protein: 8.1 g/dL (ref 6.4–8.1)

## 2016-02-26 LAB — CBC WITH DIFFERENTIAL (CANCER CENTER ONLY)
BASO#: 0 10*3/uL (ref 0.0–0.2)
BASO%: 0.3 % (ref 0.0–2.0)
EOS%: 13.9 % — AB (ref 0.0–7.0)
Eosinophils Absolute: 0.9 10*3/uL — ABNORMAL HIGH (ref 0.0–0.5)
HEMATOCRIT: 35.6 % — AB (ref 38.7–49.9)
HGB: 11.6 g/dL — ABNORMAL LOW (ref 13.0–17.1)
LYMPH#: 1.1 10*3/uL (ref 0.9–3.3)
LYMPH%: 17.5 % (ref 14.0–48.0)
MCH: 32.5 pg (ref 28.0–33.4)
MCHC: 32.6 g/dL (ref 32.0–35.9)
MCV: 100 fL — ABNORMAL HIGH (ref 82–98)
MONO#: 0.8 10*3/uL (ref 0.1–0.9)
MONO%: 12.2 % (ref 0.0–13.0)
NEUT#: 3.6 10*3/uL (ref 1.5–6.5)
NEUT%: 56.1 % (ref 40.0–80.0)
PLATELETS: 210 10*3/uL (ref 145–400)
RBC: 3.57 10*6/uL — ABNORMAL LOW (ref 4.20–5.70)
RDW: 16.3 % — AB (ref 11.1–15.7)
WBC: 6.5 10*3/uL (ref 4.0–10.0)

## 2016-02-26 LAB — TSH: TSH: 1.313 m(IU)/L (ref 0.320–4.118)

## 2016-02-26 MED ORDER — ATEZOLIZUMAB CHEMO INJECTION 1200 MG/20ML
1200.0000 mg | Freq: Once | INTRAVENOUS | Status: AC
  Administered 2016-02-26: 1200 mg via INTRAVENOUS
  Filled 2016-02-26: qty 20

## 2016-02-26 MED ORDER — SODIUM CHLORIDE 0.9% FLUSH
10.0000 mL | INTRAVENOUS | Status: DC | PRN
  Administered 2016-02-26: 10 mL
  Filled 2016-02-26: qty 10

## 2016-02-26 MED ORDER — LIDOCAINE-PRILOCAINE 2.5-2.5 % EX CREA: TOPICAL_CREAM | CUTANEOUS | Status: DC

## 2016-02-26 MED ORDER — HEPARIN SOD (PORK) LOCK FLUSH 100 UNIT/ML IV SOLN
500.0000 [IU] | Freq: Once | INTRAVENOUS | Status: AC | PRN
  Administered 2016-02-26: 500 [IU]
  Filled 2016-02-26: qty 5

## 2016-02-26 MED ORDER — SODIUM CHLORIDE 0.9 % IV SOLN
Freq: Once | INTRAVENOUS | Status: AC
  Administered 2016-02-26: 10:00:00 via INTRAVENOUS

## 2016-02-26 MED ORDER — ONDANSETRON HCL 8 MG PO TABS: 8.0000 mg | ORAL_TABLET | Freq: Two times a day (BID) | ORAL | Status: DC | PRN

## 2016-02-26 NOTE — Patient Instructions (Signed)
[facility] Discharge Instructions for Patients Receiving Chemotherapy  Today you received the following chemotherapy agents  TecentrioQ  To help prevent nausea and vomiting after your treatment, we encourage you to take your nausea medication as prescribed    If you develop nausea and vomiting that is not controlled by your nausea medication, call the clinic. If it is after clinic hours your family physician or the after hours number for the clinic or go to the Emergency Department.   BELOW ARE SYMPTOMS THAT SHOULD BE REPORTED IMMEDIATELY:  *FEVER GREATER THAN 100.5 F  *CHILLS WITH OR WITHOUT FEVER  NAUSEA AND VOMITING THAT IS NOT CONTROLLED WITH YOUR NAUSEA MEDICATION  *UNUSUAL SHORTNESS OF BREATH  *UNUSUAL BRUISING OR BLEEDING  TENDERNESS IN MOUTH AND THROAT WITH OR WITHOUT PRESENCE OF ULCERS  *URINARY PROBLEMS  *BOWEL PROBLEMS  UNUSUAL RASH Items with * indicate a potential emergency and should be followed up as soon as possible.  One of the nurses will contact you 24 hours after your treatment. Please let the nurse know about any problems that you may have experienced. Feel free to call the clinic you have any questions or concerns. The clinic phone number is [phone].   I have been informed and understand all the instructions given to me. I know to contact the clinic, my physician, or go to the Emergency Department if any problems should occur. I do not have any questions at this time, but understand that I may call the clinic during office hours   should I have any questions or need assistance in obtaining follow up care.    __________________________________________  _____________  __________ Signature of Patient or Authorized Representative            Date                   Time    __________________________________________  Nurse's Signature

## 2016-02-26 NOTE — Progress Notes (Signed)
Hematology and Oncology Follow Up Visit  Dustin Jordan [id number] [birth year] 70 y.o. 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 He had some progression of diease on his PET scan earlier this month. He had an interval increase in metabolic activity of T6 vertebra lesion and new activity in the posterior right iliac bone that is concerning for skeletal metastasis. He also had a single new small 3 mm LEFT upper lobe pulmonary nodule.  He is still having some discomfort in the right hip at times but states that this is tolerable. He states that he has no interested in radiation therapy to that area at this time.  No fever, chills, n/v, cough, rash, dizziness, chest pain, palpitations, abdominal pain or changes in his bowel or bladder habits.  His intermittent SOB with exertion is unchanged. No syncopal episodes or falls.  No lymphadenopathy found on assessment. No episodes of bleeding or bruising.  No weakness, swelling, numbness or tingling in his extremities. He is still eating well and staying hydrated. His weight is up 4 lbs since his last visit.   Medications:    Medication List       This list is accurate as of: 02/26/16  8:53 AM.  Always use your most recent med list.               hydrochlorothiazide 12.5 MG capsule  Commonly known as:  MICROZIDE  TAKE 1 CAPSULE BY MOUTH EVERY MORNING     lidocaine-prilocaine cream  Commonly known as:  EMLA  Apply 1 application topically as needed.     meloxicam 7.5 MG tablet  Commonly known as:  MOBIC  Take 1 tablet (7.5 mg total) by mouth daily.     tamsulosin 0.4 MG Caps capsule  Commonly known as:  FLOMAX  Take 2 capsules (0.8 mg total) by mouth daily.     VISINE OP  Apply  1-2 drops to eye daily as needed (redness.). Reported on 11/21/2015        Allergies: No Known Allergies  Past Medical History, Surgical history, Social history, and Family History were reviewed and updated.  Review of Systems: All other 10 point review of systems is negative.   Physical Exam:  vitals were not taken for this visit.  Wt Readings from Last 3 Encounters:  01/28/16 190 lb (86.183 kg)  01/21/16 193 lb (87.544 kg)  01/07/16 197 lb (89.359 kg)    Ocular: Sclerae unicteric, pupils equal, round and reactive to light Ear-nose-throat: Oropharynx clear, dentition fair Lymphatic: No cervical supraclavicular or axillary adenopathy Lungs no rales or rhonchi, good excursion bilaterally Heart regular rate and rhythm, no murmur appreciated Abd soft, nontender, positive bowel sounds, no liver or spleen tip palpated on exam, no fluid wave MSK no focal spinal tenderness, no joint edema Neuro: non-focal, well-oriented, appropriate affect Breasts: Deferred  Lab Results  Component Value Date   WBC 4.1 01/28/2016   HGB 10.0* 01/28/2016   HCT 30.6* 01/28/2016   MCV 106* 01/28/2016   PLT 133* 01/28/2016   Lab Results  Component Value Date   FERRITIN 122 01/28/2016   IRON 51 01/28/2016   TIBC 348 01/28/2016   UIBC 297 01/28/2016   IRONPCTSAT 15* 01/28/2016  Lab Results  Component Value Date   RBC 2.90* 01/28/2016   No results found for: KPAFRELGTCHN, LAMBDASER, KAPLAMBRATIO No results found for: IGGSERUM, IGA, IGMSERUM No results found for: Dustin Jordan, SPEI   Chemistry      Component Value Date/Time   NA 139 01/28/2016 0904   NA 140 11/27/2015 0805   K 3.8 01/28/2016 0904   K 4.8 11/27/2015 0805   CL 103 01/28/2016 0904   CL 109 11/27/2015 0805   CO2 29 01/28/2016 0904   CO2 24 11/27/2015 0805   BUN 13 01/28/2016 0904   BUN 20 11/27/2015 0805   CREATININE 1.0 01/28/2016 0904   CREATININE 1.14 01/19/2015  1210      Component Value Date/Time   CALCIUM 8.8 01/28/2016 0904   CALCIUM 8.9 11/27/2015 0805   ALKPHOS 54 01/28/2016 0904   ALKPHOS 35* 11/27/2015 0805   AST 49* 01/28/2016 0904   AST 19 11/27/2015 0805   ALT 44 01/28/2016 0904   ALT 11 11/27/2015 0805   BILITOT 1.00 01/28/2016 0904   BILITOT 0.5 11/27/2015 0805     Impression and Plan: Dustin Jordan is very pleasant 70 yo African American male with poorly differentiated metastatic bladder cancer. He was being treated with Carboplatin and Gemzar but on his PET scan earlier this month he had some progression of disease. We are not switching his treatment to [facility] and he will begin cycle 1 today. We discussed potential sides effects with this new treatment in detail and he will contact out office with any questions or concerns. His CBC and CMP look good. His TSH is pending.  He does not wish to have radiation to the right hip at this time. He states if the pain worsens he will re-evaluate his decision.  He will get a new treatment and appointment schedule today before leaving.  He will contact us with any questions or concerns. We can certainly see him sooner if need be.   Eliezer Bottom, NP 5/30/20178:53 AM

## 2016-02-27 ENCOUNTER — Encounter: Payer: Self-pay | Admitting: Hematology & Oncology

## 2016-03-10 ENCOUNTER — Ambulatory Visit: Payer: Medicare Other

## 2016-03-10 ENCOUNTER — Ambulatory Visit: Payer: Medicare Other | Admitting: Hematology & Oncology

## 2016-03-10 ENCOUNTER — Other Ambulatory Visit: Payer: Medicare Other

## 2016-03-19 ENCOUNTER — Telehealth: Payer: Self-pay | Admitting: *Deleted

## 2016-03-19 ENCOUNTER — Ambulatory Visit (HOSPITAL_BASED_OUTPATIENT_CLINIC_OR_DEPARTMENT_OTHER): Payer: Medicare Other

## 2016-03-19 ENCOUNTER — Encounter: Payer: Self-pay | Admitting: Hematology & Oncology

## 2016-03-19 ENCOUNTER — Ambulatory Visit (HOSPITAL_BASED_OUTPATIENT_CLINIC_OR_DEPARTMENT_OTHER): Payer: Medicare Other | Admitting: Hematology & Oncology

## 2016-03-19 VITALS — BP 117/64 | HR 93 | Temp 98.0°F | Resp 18 | Ht 74.0 in | Wt 193.0 lb

## 2016-03-19 DIAGNOSIS — Z79899 Other long term (current) drug therapy: Secondary | ICD-10-CM | POA: Diagnosis not present

## 2016-03-19 DIAGNOSIS — C775 Secondary and unspecified malignant neoplasm of intrapelvic lymph nodes: Principal | ICD-10-CM

## 2016-03-19 DIAGNOSIS — C779 Secondary and unspecified malignant neoplasm of lymph node, unspecified: Secondary | ICD-10-CM | POA: Diagnosis not present

## 2016-03-19 DIAGNOSIS — C7951 Secondary malignant neoplasm of bone: Secondary | ICD-10-CM

## 2016-03-19 DIAGNOSIS — C679 Malignant neoplasm of bladder, unspecified: Secondary | ICD-10-CM

## 2016-03-19 LAB — TSH: TSH: 1.192 m[IU]/L (ref 0.320–4.118)

## 2016-03-19 LAB — CBC WITH DIFFERENTIAL (CANCER CENTER ONLY)
BASO#: 0 10*3/uL (ref 0.0–0.2)
BASO%: 0.3 % (ref 0.0–2.0)
EOS%: 8.3 % — AB (ref 0.0–7.0)
Eosinophils Absolute: 0.6 10*3/uL — ABNORMAL HIGH (ref 0.0–0.5)
HEMATOCRIT: 35.8 % — AB (ref 38.7–49.9)
HGB: 11.8 g/dL — ABNORMAL LOW (ref 13.0–17.1)
LYMPH#: 1.5 10*3/uL (ref 0.9–3.3)
LYMPH%: 21.5 % (ref 14.0–48.0)
MCH: 30.5 pg (ref 28.0–33.4)
MCHC: 33 g/dL (ref 32.0–35.9)
MCV: 93 fL (ref 82–98)
MONO#: 0.9 10*3/uL (ref 0.1–0.9)
MONO%: 13.1 % — ABNORMAL HIGH (ref 0.0–13.0)
NEUT%: 56.8 % (ref 40.0–80.0)
NEUTROS ABS: 3.9 10*3/uL (ref 1.5–6.5)
Platelets: 202 10*3/uL (ref 145–400)
RBC: 3.87 10*6/uL — AB (ref 4.20–5.70)
RDW: 17.2 % — AB (ref 11.1–15.7)
WBC: 6.9 10*3/uL (ref 4.0–10.0)

## 2016-03-19 LAB — CMP (CANCER CENTER ONLY)
ALT(SGPT): 180 U/L — ABNORMAL HIGH (ref 10–47)
AST: 261 U/L (ref 11–38)
Albumin: 3.1 g/dL — ABNORMAL LOW (ref 3.3–5.5)
Alkaline Phosphatase: 78 U/L (ref 26–84)
BUN: 14 mg/dL (ref 7–22)
CHLORIDE: 102 meq/L (ref 98–108)
CO2: 30 meq/L (ref 18–33)
CREATININE: 1 mg/dL (ref 0.6–1.2)
Calcium: 9.3 mg/dL (ref 8.0–10.3)
Glucose, Bld: 123 mg/dL — ABNORMAL HIGH (ref 73–118)
Potassium: 3.4 mEq/L (ref 3.3–4.7)
SODIUM: 135 meq/L (ref 128–145)
TOTAL PROTEIN: 8 g/dL (ref 6.4–8.1)
Total Bilirubin: 0.9 mg/dl (ref 0.20–1.60)

## 2016-03-19 LAB — LACTATE DEHYDROGENASE: LDH: 368 U/L — ABNORMAL HIGH (ref 125–245)

## 2016-03-19 MED ORDER — NAPROXEN 500 MG PO TABS: 500.0000 mg | ORAL_TABLET | Freq: Two times a day (BID) | ORAL | Status: DC

## 2016-03-19 MED ORDER — SODIUM CHLORIDE 0.9% FLUSH
10.0000 mL | INTRAVENOUS | Status: DC | PRN
  Administered 2016-03-19: 10 mL via INTRAVENOUS
  Filled 2016-03-19: qty 10

## 2016-03-19 MED ORDER — HEPARIN SOD (PORK) LOCK FLUSH 100 UNIT/ML IV SOLN
500.0000 [IU] | Freq: Once | INTRAVENOUS | Status: AC
  Administered 2016-03-19: 500 [IU] via INTRAVENOUS
  Filled 2016-03-19: qty 5

## 2016-03-19 NOTE — Progress Notes (Signed)
Hematology and Oncology Follow Up Visit  WOOD AXELSON [id number] 1946-06-14 70 y.o. 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 Friis is here today for a follow-up.he tolerated the first cycle of Tecentriq rooty well. However, it looks like his liver function tests are too high. This is seen about 40% of patients. Because this, we are not able to give treatment right now. He is totally asymptomatic with this. I don't think we have to put him on any kind of steroids right now.  He's does have some pain in the right hip. This is where he has his metastatic disease area and he has had radiation to this area. Naprosyn seems to help.  His appetite is okay. He's had no nausea or vomiting. He's had no cough. He's had no bleeding. He's had no rashes.  Overall, his performance status is ECOG 1. t Medications:    Medication List       This list is accurate as of: 03/19/16 12:16 PM.  Always use your most recent med list.               hydrochlorothiazide 12.5 MG capsule  Commonly known as:  MICROZIDE  TAKE 1 CAPSULE BY MOUTH EVERY MORNING     lidocaine-prilocaine cream  Commonly known as:  EMLA  Apply 1 application topically as needed.     lidocaine-prilocaine cream  Commonly known as:  EMLA  Apply to affected area once     naproxen 500 MG tablet  Commonly known as:  NAPROSYN  Take 1 tablet (500 mg total) by mouth 2 (two) times daily with a meal.     ondansetron 8 MG tablet  Commonly known as:  ZOFRAN  Take 1 tablet (8 mg total) by mouth 2 (two) times daily as needed (Nausea or vomiting).     tamsulosin 0.4 MG Caps capsule  Commonly known as:  FLOMAX  Take 2 capsules (0.8 mg total) by mouth daily.     VISINE OP  Apply 1-2 drops to eye daily as needed (redness.). Reported on 11/21/2015         Allergies: No Known Allergies  Past Medical History, Surgical history, Social history, and Family History were reviewed and updated.  Review of Systems: All other 10 point review of systems is negative.   Physical Exam:  height is 6\' 2"  (1.88 m) and weight is 193 lb (87.544 kg). His oral temperature is 98 F (36.7 C). His blood pressure is 117/64 and his pulse is 93. His respiration is 18.   Wt Readings from Last 3 Encounters:  03/19/16 193 lb (87.544 kg)  02/26/16 194 lb (87.998 kg)  01/28/16 190 lb (86.183 kg)    Ocular: Sclerae unicteric, pupils equal, round and reactive to light Ear-nose-throat: Oropharynx clear, dentition fair Lymphatic: No cervical supraclavicular or axillary adenopathy Lungs no rales or rhonchi, good excursion bilaterally Heart regular rate and rhythm, no murmur appreciated Abd soft, nontender, positive bowel sounds, no liver or spleen tip palpated on exam, no fluid wave MSK no focal spinal tenderness, no joint edema Neuro: non-focal, well-oriented, appropriate affect   Lab Results  Component Value Date   WBC 6.5 02/26/2016   HGB 11.6* 02/26/2016   HCT 35.6* 02/26/2016   MCV 100* 02/26/2016   PLT 210 02/26/2016  Lab Results  Component Value Date   FERRITIN 122 01/28/2016   IRON 51 01/28/2016   TIBC 348 01/28/2016   UIBC 297 01/28/2016   IRONPCTSAT 15* 01/28/2016   Lab Results  Component Value Date   RBC 3.57* 02/26/2016   No results found for: KPAFRELGTCHN, LAMBDASER, KAPLAMBRATIO No results found for: IGGSERUM, IGA, IGMSERUM No results found for: Odetta Pink, SPEI   Chemistry      Component Value Date/Time   NA 135 03/19/2016 1051   NA 140 11/27/2015 0805   K 3.4 03/19/2016 1051   K 4.8 11/27/2015 0805   CL 102 03/19/2016 1051   CL 109 11/27/2015 0805   CO2 30 03/19/2016 1051   CO2 24 11/27/2015 0805   BUN 14 03/19/2016 1051   BUN 20 11/27/2015 0805   CREATININE 1.0  03/19/2016 1051   CREATININE 1.14 01/19/2015 1210      Component Value Date/Time   CALCIUM 9.3 03/19/2016 1051   CALCIUM 8.9 11/27/2015 0805   ALKPHOS 78 03/19/2016 1051   ALKPHOS 35* 11/27/2015 0805   AST 261* 03/19/2016 1051   AST 19 11/27/2015 0805   ALT 180* 03/19/2016 1051   ALT 11 11/27/2015 0805   BILITOT 0.90 03/19/2016 1051   BILITOT 0.5 11/27/2015 0805     Impression and Plan: Mr. Steltz is very pleasant 70 yo African American male with poorly differentiated metastatic bladder cancer.   We now have him on Tecentriq. Hopefully, his LFTs will improve. If not, I'm not sure what else we might be old to do. We might feel would try another immunotherapy. I realize that there are now 2 or 3 that have been approved that we might be able to utilize.   I reviewed labs with him. I showed him the problem. He understands.  We will get him back in 2 weeks. We'll see if his liver function test improved to where we can treat him again. I will no free have to make a dosage adjustment with the Tecentriq.  I spent about 30 minutes with him today.  [name], MD 6/21/201712:16 PM

## 2016-03-19 NOTE — Telephone Encounter (Signed)
Critical Value AST 261 Dr [name] notified. No orders at this time.

## 2016-03-19 NOTE — Patient Instructions (Signed)

## 2016-03-25 ENCOUNTER — Encounter: Payer: Self-pay | Admitting: Family Medicine

## 2016-03-31 ENCOUNTER — Other Ambulatory Visit: Payer: Medicare Other

## 2016-03-31 ENCOUNTER — Ambulatory Visit: Payer: Medicare Other | Admitting: Hematology & Oncology

## 2016-03-31 ENCOUNTER — Ambulatory Visit: Payer: Medicare Other

## 2016-04-02 ENCOUNTER — Ambulatory Visit (HOSPITAL_BASED_OUTPATIENT_CLINIC_OR_DEPARTMENT_OTHER): Payer: Medicare Other | Admitting: Family

## 2016-04-02 ENCOUNTER — Encounter: Payer: Self-pay | Admitting: Family

## 2016-04-02 ENCOUNTER — Ambulatory Visit (HOSPITAL_BASED_OUTPATIENT_CLINIC_OR_DEPARTMENT_OTHER): Payer: Medicare Other

## 2016-04-02 ENCOUNTER — Ambulatory Visit: Payer: Medicare Other

## 2016-04-02 ENCOUNTER — Other Ambulatory Visit (HOSPITAL_BASED_OUTPATIENT_CLINIC_OR_DEPARTMENT_OTHER): Payer: Medicare Other

## 2016-04-02 VITALS — BP 148/89 | HR 78 | Temp 98.0°F | Resp 18 | Ht 74.0 in | Wt 192.0 lb

## 2016-04-02 DIAGNOSIS — R7989 Other specified abnormal findings of blood chemistry: Secondary | ICD-10-CM | POA: Diagnosis not present

## 2016-04-02 DIAGNOSIS — C775 Secondary and unspecified malignant neoplasm of intrapelvic lymph nodes: Principal | ICD-10-CM

## 2016-04-02 DIAGNOSIS — C679 Malignant neoplasm of bladder, unspecified: Secondary | ICD-10-CM

## 2016-04-02 DIAGNOSIS — C779 Secondary and unspecified malignant neoplasm of lymph node, unspecified: Secondary | ICD-10-CM

## 2016-04-02 DIAGNOSIS — C7951 Secondary malignant neoplasm of bone: Secondary | ICD-10-CM | POA: Diagnosis not present

## 2016-04-02 DIAGNOSIS — E079 Disorder of thyroid, unspecified: Secondary | ICD-10-CM

## 2016-04-02 LAB — CMP (CANCER CENTER ONLY)
ALK PHOS: 75 U/L (ref 26–84)
ALT: 106 U/L — AB (ref 10–47)
AST: 142 U/L — AB (ref 11–38)
Albumin: 3 g/dL — ABNORMAL LOW (ref 3.3–5.5)
BILIRUBIN TOTAL: 0.8 mg/dL (ref 0.20–1.60)
BUN: 19 mg/dL (ref 7–22)
CO2: 29 meq/L (ref 18–33)
CREATININE: 1.1 mg/dL (ref 0.6–1.2)
Calcium: 9.3 mg/dL (ref 8.0–10.3)
Chloride: 103 mEq/L (ref 98–108)
GLUCOSE: 103 mg/dL (ref 73–118)
Potassium: 3.5 mEq/L (ref 3.3–4.7)
SODIUM: 135 meq/L (ref 128–145)
Total Protein: 7.9 g/dL (ref 6.4–8.1)

## 2016-04-02 LAB — CBC WITH DIFFERENTIAL (CANCER CENTER ONLY)
BASO#: 0 10*3/uL (ref 0.0–0.2)
BASO%: 0.3 % (ref 0.0–2.0)
EOS%: 3.8 % (ref 0.0–7.0)
Eosinophils Absolute: 0.2 10*3/uL (ref 0.0–0.5)
HEMATOCRIT: 35.8 % — AB (ref 38.7–49.9)
HGB: 11.5 g/dL — ABNORMAL LOW (ref 13.0–17.1)
LYMPH#: 1.5 10*3/uL (ref 0.9–3.3)
LYMPH%: 23 % (ref 14.0–48.0)
MCH: 28.6 pg (ref 28.0–33.4)
MCHC: 32.1 g/dL (ref 32.0–35.9)
MCV: 89 fL (ref 82–98)
MONO#: 1 10*3/uL — ABNORMAL HIGH (ref 0.1–0.9)
MONO%: 15.3 % — AB (ref 0.0–13.0)
NEUT#: 3.7 10*3/uL (ref 1.5–6.5)
NEUT%: 57.6 % (ref 40.0–80.0)
PLATELETS: 219 10*3/uL (ref 145–400)
RBC: 4.02 10*6/uL — ABNORMAL LOW (ref 4.20–5.70)
RDW: 17.9 % — AB (ref 11.1–15.7)
WBC: 6.4 10*3/uL (ref 4.0–10.0)

## 2016-04-02 MED ORDER — SODIUM CHLORIDE 0.9 % IJ SOLN
10.0000 mL | INTRAMUSCULAR | Status: DC | PRN
  Administered 2016-04-02: 10 mL
  Filled 2016-04-02: qty 10

## 2016-04-02 MED ORDER — ZOLEDRONIC ACID 4 MG/100ML IV SOLN
4.0000 mg | Freq: Once | INTRAVENOUS | Status: AC
  Administered 2016-04-02: 4 mg via INTRAVENOUS
  Filled 2016-04-02: qty 100

## 2016-04-02 MED ORDER — HEPARIN SOD (PORK) LOCK FLUSH 100 UNIT/ML IV SOLN
500.0000 [IU] | Freq: Once | INTRAVENOUS | Status: AC | PRN
  Administered 2016-04-02: 500 [IU]
  Filled 2016-04-02: qty 5

## 2016-04-02 MED ORDER — SODIUM CHLORIDE 0.9 % IV SOLN
Freq: Once | INTRAVENOUS | Status: AC
  Administered 2016-04-02: 10:00:00 via INTRAVENOUS

## 2016-04-02 NOTE — Patient Instructions (Signed)

## 2016-04-02 NOTE — Progress Notes (Signed)
Hematology and Oncology Follow Up Visit  XACHARY GRENELL [id number] 09-Jun-1946 70 y.o. 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 Zirk is here today for a follow-up. He has had a two week break from treatment due to elevated LFT's after his first treatment with Tecentriqu. His numbers are slowly improving, AST is now 142 and ALT 106. He is still asymptomatic with this.  No fever, chills, n/v, cough, rash, dizziness, chest pain, palpitations abdominal pain or changes in bowel or bladder habits.  His intermittent SOB with exertion is unchanged. No syncopal episodes or falls.  He still has some discomfort in the right hip area and takes Naproxen as needed in moderation for pain.  No lymphadenopathy found on assessment. No episodes of bleeding or bruising.  No weakness, swelling, numbness or tingling in his extremities. He has maintained a good appetite and is staying well hydrated. His weight is stable.   Medications:    Medication List       This list is accurate as of: 04/02/16  9:25 AM.  Always use your most recent med list.               hydrochlorothiazide 12.5 MG capsule  Commonly known as:  MICROZIDE  TAKE 1 CAPSULE BY MOUTH EVERY MORNING     lidocaine-prilocaine cream  Commonly known as:  EMLA  Apply 1 application topically as needed.     lidocaine-prilocaine cream  Commonly known as:  EMLA  Apply to affected area once     naproxen 500 MG tablet  Commonly known as:  NAPROSYN  Take 1 tablet (500 mg total) by mouth 2 (two) times daily with a meal.     ondansetron 8 MG tablet  Commonly known as:  ZOFRAN  Take 1 tablet (8 mg total) by mouth 2 (two) times daily as needed (Nausea or vomiting).     tamsulosin 0.4 MG Caps capsule  Commonly known as:  FLOMAX  Take 2 capsules (0.8 mg total) by mouth daily.     VISINE OP  Apply 1-2 drops to eye daily as needed (redness.). Reported on 11/21/2015        Allergies: No Known Allergies  Past Medical History, Surgical history, Social history, and Family History were reviewed and updated.  Review of Systems: All other 10 point review of systems is negative.   Physical Exam:  height is 6\' 2"  (1.88 m) and weight is 192 lb (87.091 kg). His oral temperature is 98 F (36.7 C). His blood pressure is 148/89 and his pulse is 78. His respiration is 18.   Wt Readings from Last 3 Encounters:  04/02/16 192 lb (87.091 kg)  03/19/16 193 lb (87.544 kg)  02/26/16 194 lb (87.998 kg)    Ocular: Sclerae unicteric, pupils equal, round and reactive to light Ear-nose-throat: Oropharynx clear, dentition fair Lymphatic: No cervical supraclavicular or axillary adenopathy Lungs no rales or rhonchi, good excursion bilaterally Heart regular rate and rhythm, no murmur appreciated Abd soft, nontender, positive bowel sounds, no liver or spleen tip palpated on exam, no fluid wave MSK no focal spinal tenderness, no joint edema Neuro: non-focal, well-oriented, appropriate affect Breasts: Deferred  Lab Results  Component Value Date   WBC 6.4 04/02/2016   HGB 11.5* 04/02/2016   HCT 35.8* 04/02/2016   MCV 89 04/02/2016  PLT 219 04/02/2016   Lab Results  Component Value Date   FERRITIN 122 01/28/2016   IRON 51 01/28/2016   TIBC 348 01/28/2016   UIBC 297 01/28/2016   IRONPCTSAT 15* 01/28/2016   Lab Results  Component Value Date   RBC 4.02* 04/02/2016   No results found for: KPAFRELGTCHN, LAMBDASER, KAPLAMBRATIO No results found for: IGGSERUM, IGA, IGMSERUM No results found for: Odetta Pink, SPEI   Chemistry      Component Value Date/Time   NA 135 03/19/2016 1051   NA 140 11/27/2015 0805   K 3.4 03/19/2016 1051   K 4.8 11/27/2015 0805   CL 102 03/19/2016 1051   CL 109 11/27/2015 0805   CO2 30  03/19/2016 1051   CO2 24 11/27/2015 0805   BUN 14 03/19/2016 1051   BUN 20 11/27/2015 0805   CREATININE 1.0 03/19/2016 1051   CREATININE 1.14 01/19/2015 1210      Component Value Date/Time   CALCIUM 9.3 03/19/2016 1051   CALCIUM 8.9 11/27/2015 0805   ALKPHOS 78 03/19/2016 1051   ALKPHOS 35* 11/27/2015 0805   AST 261* 03/19/2016 1051   AST 19 11/27/2015 0805   ALT 180* 03/19/2016 1051   ALT 11 11/27/2015 0805   BILITOT 0.90 03/19/2016 1051   BILITOT 0.5 11/27/2015 0805     Impression and Plan: Mr. Kelty is very pleasant 70 yo African American male with poorly differentiated metastatic bladder cancer. After receiving his first treatment with Tecentriq his LFT's became quite elevated. He has had a two week break (total of 5 weeks since initial treatment) and his numbers are slowly coming down. He is asymptomatic at this time.  We will give him one more week off and repeat labs next Wednesday. Hopefully, we will be able to resume treatment at that time.  He will receive Zometa today as planned.  He will receive a new treatment and appointment schedule today.  He will contact us with any questions or concerns. We can certainly see him sooner if need be.   Eliezer Bottom, NP 7/5/20179:25 AM

## 2016-04-02 NOTE — Progress Notes (Signed)
Zometa only per Laverna Peace NP.

## 2016-04-09 ENCOUNTER — Encounter: Payer: Self-pay | Admitting: Family

## 2016-04-09 ENCOUNTER — Other Ambulatory Visit: Payer: Medicare Other

## 2016-04-09 ENCOUNTER — Ambulatory Visit: Payer: Medicare Other | Admitting: Family

## 2016-04-09 ENCOUNTER — Ambulatory Visit: Payer: Medicare Other

## 2016-04-09 ENCOUNTER — Ambulatory Visit (HOSPITAL_BASED_OUTPATIENT_CLINIC_OR_DEPARTMENT_OTHER): Payer: Medicare Other

## 2016-04-09 ENCOUNTER — Other Ambulatory Visit (HOSPITAL_BASED_OUTPATIENT_CLINIC_OR_DEPARTMENT_OTHER): Payer: Medicare Other

## 2016-04-09 ENCOUNTER — Other Ambulatory Visit: Payer: Self-pay | Admitting: Family

## 2016-04-09 ENCOUNTER — Ambulatory Visit (HOSPITAL_BASED_OUTPATIENT_CLINIC_OR_DEPARTMENT_OTHER): Payer: Medicare Other | Admitting: Family

## 2016-04-09 VITALS — BP 156/84 | HR 78 | Temp 97.5°F | Resp 18 | Ht 74.0 in | Wt 196.0 lb

## 2016-04-09 DIAGNOSIS — C775 Secondary and unspecified malignant neoplasm of intrapelvic lymph nodes: Principal | ICD-10-CM

## 2016-04-09 DIAGNOSIS — C7951 Secondary malignant neoplasm of bone: Secondary | ICD-10-CM | POA: Diagnosis not present

## 2016-04-09 DIAGNOSIS — Z79899 Other long term (current) drug therapy: Secondary | ICD-10-CM | POA: Diagnosis not present

## 2016-04-09 DIAGNOSIS — Z5112 Encounter for antineoplastic immunotherapy: Secondary | ICD-10-CM

## 2016-04-09 DIAGNOSIS — C779 Secondary and unspecified malignant neoplasm of lymph node, unspecified: Secondary | ICD-10-CM

## 2016-04-09 DIAGNOSIS — E079 Disorder of thyroid, unspecified: Secondary | ICD-10-CM

## 2016-04-09 DIAGNOSIS — C679 Malignant neoplasm of bladder, unspecified: Secondary | ICD-10-CM

## 2016-04-09 DIAGNOSIS — M25551 Pain in right hip: Secondary | ICD-10-CM | POA: Diagnosis not present

## 2016-04-09 LAB — CBC WITH DIFFERENTIAL (CANCER CENTER ONLY)
BASO#: 0 10*3/uL (ref 0.0–0.2)
BASO%: 0.3 % (ref 0.0–2.0)
EOS%: 3 % (ref 0.0–7.0)
Eosinophils Absolute: 0.2 10*3/uL (ref 0.0–0.5)
HEMATOCRIT: 34.1 % — AB (ref 38.7–49.9)
HEMOGLOBIN: 11.1 g/dL — AB (ref 13.0–17.1)
LYMPH#: 1.3 10*3/uL (ref 0.9–3.3)
LYMPH%: 19.7 % (ref 14.0–48.0)
MCH: 28.3 pg (ref 28.0–33.4)
MCHC: 32.6 g/dL (ref 32.0–35.9)
MCV: 87 fL (ref 82–98)
MONO#: 0.8 10*3/uL (ref 0.1–0.9)
MONO%: 11.8 % (ref 0.0–13.0)
NEUT%: 65.2 % (ref 40.0–80.0)
NEUTROS ABS: 4.4 10*3/uL (ref 1.5–6.5)
Platelets: 231 10*3/uL (ref 145–400)
RBC: 3.92 10*6/uL — ABNORMAL LOW (ref 4.20–5.70)
RDW: 18.2 % — ABNORMAL HIGH (ref 11.1–15.7)
WBC: 6.7 10*3/uL (ref 4.0–10.0)

## 2016-04-09 LAB — CMP (CANCER CENTER ONLY)
ALBUMIN: 3 g/dL — AB (ref 3.3–5.5)
ALK PHOS: 72 U/L (ref 26–84)
ALT: 60 U/L — AB (ref 10–47)
AST: 81 U/L — AB (ref 11–38)
BILIRUBIN TOTAL: 0.7 mg/dL (ref 0.20–1.60)
BUN, Bld: 16 mg/dL (ref 7–22)
CALCIUM: 9.3 mg/dL (ref 8.0–10.3)
CO2: 28 mEq/L (ref 18–33)
Chloride: 102 mEq/L (ref 98–108)
Creat: 0.9 mg/dl (ref 0.6–1.2)
Glucose, Bld: 98 mg/dL (ref 73–118)
Potassium: 3.5 mEq/L (ref 3.3–4.7)
Sodium: 133 mEq/L (ref 128–145)
Total Protein: 7.6 g/dL (ref 6.4–8.1)

## 2016-04-09 LAB — TSH: TSH: 1.292 m[IU]/L (ref 0.320–4.118)

## 2016-04-09 LAB — LACTATE DEHYDROGENASE: LDH: 327 U/L — AB (ref 125–245)

## 2016-04-09 MED ORDER — HEPARIN SOD (PORK) LOCK FLUSH 100 UNIT/ML IV SOLN
500.0000 [IU] | Freq: Once | INTRAVENOUS | Status: AC | PRN
  Administered 2016-04-09: 500 [IU]
  Filled 2016-04-09: qty 5

## 2016-04-09 MED ORDER — SODIUM CHLORIDE 0.9 % IV SOLN
Freq: Once | INTRAVENOUS | Status: AC
  Administered 2016-04-09: 12:00:00 via INTRAVENOUS

## 2016-04-09 MED ORDER — SODIUM CHLORIDE 0.9% FLUSH
10.0000 mL | INTRAVENOUS | Status: DC | PRN
  Administered 2016-04-09: 10 mL
  Filled 2016-04-09: qty 10

## 2016-04-09 MED ORDER — SODIUM CHLORIDE 0.9 % IV SOLN
1200.0000 mg | Freq: Once | INTRAVENOUS | Status: AC
  Administered 2016-04-09: 1200 mg via INTRAVENOUS
  Filled 2016-04-09: qty 20

## 2016-04-09 MED ORDER — HYDROCODONE-IBUPROFEN 7.5-200 MG PO TABS: 1.0000 | ORAL_TABLET | Freq: Three times a day (TID) | ORAL | Status: DC | PRN

## 2016-04-09 MED ORDER — HYDROCODONE-IBUPROFEN 5-200 MG PO TABS: 1.0000 | ORAL_TABLET | Freq: Three times a day (TID) | ORAL | Status: DC | PRN

## 2016-04-09 MED FILL — HYDROCOD-IBU 7.5-200 TAB: 7.5-200 | 20 days supply | Qty: 60 | Fill #0

## 2016-04-09 NOTE — Progress Notes (Signed)
Hematology and Oncology Follow Up Visit  Dustin Jordan [id number] 02-Jan-1946 70 y.o. 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 His LFTs have improved. He is still having right hip discomfort that radiates to the knee off and on. He has been taking Naproxen but this is not giving him much relief and he is not sleeping well at night.  No fever, chills, n/v, cough, rash, dizziness, chest pain, palpitations abdominal pain or changes in bowel or bladder habits.  His intermittent SOB with exertion is unchanged. No syncopal episodes or falls.  No lymphadenopathy found on assessment. No episodes of bleeding or bruising.  No weakness, swelling, numbness or tingling in his extremities. He has maintained a good appetite and is staying well hydrated. His weight is stable.   Medications:    Medication List       This list is accurate as of: 04/09/16 10:06 AM.  Always use your most recent med list.               hydrochlorothiazide 12.5 MG capsule  Commonly known as:  MICROZIDE  TAKE 1 CAPSULE BY MOUTH EVERY MORNING     lidocaine-prilocaine cream  Commonly known as:  EMLA  Apply 1 application topically as needed.     lidocaine-prilocaine cream  Commonly known as:  EMLA  Apply to affected area once     naproxen 500 MG tablet  Commonly known as:  NAPROSYN  Take 1 tablet (500 mg total) by mouth 2 (two) times daily with a meal.     ondansetron 8 MG tablet  Commonly known as:  ZOFRAN  Take 1 tablet (8 mg total) by mouth 2 (two) times daily as needed (Nausea or vomiting).     tamsulosin 0.4 MG Caps capsule  Commonly known as:  FLOMAX  Take 2 capsules (0.8 mg total) by mouth daily.     VISINE OP  Apply 1-2 drops to eye daily as needed (redness.).  Reported on 11/21/2015        Allergies: No Known Allergies  Past Medical History, Surgical history, Social history, and Family History were reviewed and updated.  Review of Systems: All other 10 point review of systems is negative.   Physical Exam:  height is 6\' 2"  (1.88 m) and weight is 196 lb (88.905 kg). His oral temperature is 97.5 F (36.4 C). His blood pressure is 156/84 and his pulse is 78. His respiration is 18.   Wt Readings from Last 3 Encounters:  04/09/16 196 lb (88.905 kg)  04/02/16 192 lb (87.091 kg)  03/19/16 193 lb (87.544 kg)    Ocular: Sclerae unicteric, pupils equal, round and reactive to light Ear-nose-throat: Oropharynx clear, dentition fair Lymphatic: No cervical supraclavicular or axillary adenopathy Lungs no rales or rhonchi, good excursion bilaterally Heart regular rate and rhythm, no murmur appreciated Abd soft, nontender, positive bowel sounds, no liver or spleen tip palpated on exam, no fluid wave MSK no focal spinal tenderness, no joint edema, has pain in the right hip that radiates to his knee Neuro: non-focal, well-oriented, appropriate affect Breasts: Deferred  Lab Results  Component Value Date   WBC 6.7 04/09/2016   HGB 11.1* 04/09/2016   HCT 34.1* 04/09/2016   MCV 87  04/09/2016   PLT 231 04/09/2016   Lab Results  Component Value Date   FERRITIN 122 01/28/2016   IRON 51 01/28/2016   TIBC 348 01/28/2016   UIBC 297 01/28/2016   IRONPCTSAT 15* 01/28/2016   Lab Results  Component Value Date   RBC 3.92* 04/09/2016   No results found for: KPAFRELGTCHN, LAMBDASER, KAPLAMBRATIO No results found for: IGGSERUM, IGA, IGMSERUM No results found for: Odetta Pink, SPEI   Chemistry      Component Value Date/Time   NA 135 04/02/2016 0839   NA 140 11/27/2015 0805   K 3.5 04/02/2016 0839   K 4.8 11/27/2015 0805   CL 103 04/02/2016 0839   CL 109 11/27/2015 0805   CO2 29 04/02/2016 0839     CO2 24 11/27/2015 0805   BUN 19 04/02/2016 0839   BUN 20 11/27/2015 0805   CREATININE 1.1 04/02/2016 0839   CREATININE 1.14 01/19/2015 1210      Component Value Date/Time   CALCIUM 9.3 04/02/2016 0839   CALCIUM 8.9 11/27/2015 0805   ALKPHOS 75 04/02/2016 0839   ALKPHOS 35* 11/27/2015 0805   AST 142* 04/02/2016 0839   AST 19 11/27/2015 0805   ALT 106* 04/02/2016 0839   ALT 11 11/27/2015 0805   BILITOT 0.80 04/02/2016 0839   BILITOT 0.5 11/27/2015 0805     Impression and Plan: Dustin Jordan is very pleasant 70 yo African American male with poorly differentiated metastatic bladder cancer. After receiving his first treatment with Tecentriq his LFT's became quite elevated. He has had a 3 week break. His LFT's are improved. He is still having right hip pain that radiates to his knee at times.  We will proceed with cycle 2 of Tecentriq at a reduced dose per Dr. [name].  We will get a CT of the pelvis and see is there have been any changes in the right hip area.  Prescription for Vicoprofen for pain was given to the patient.  He has his current treatment and appointment schedule.  He will contact us with any questions or concerns. We can certainly see him sooner if need be.   Eliezer Bottom, NP 7/12/201710:06 AM

## 2016-04-09 NOTE — Progress Notes (Signed)
Ok to treat despite LFT. Per Dr. [name].

## 2016-04-09 NOTE — Patient Instructions (Signed)
[facility] Discharge Instructions for Patients Receiving Chemotherapy  Today you received the following chemotherapy agents  TecentrioQ  To help prevent nausea and vomiting after your treatment, we encourage you to take your nausea medication as prescribed    If you develop nausea and vomiting that is not controlled by your nausea medication, call the clinic. If it is after clinic hours your family physician or the after hours number for the clinic or go to the Emergency Department.   BELOW ARE SYMPTOMS THAT SHOULD BE REPORTED IMMEDIATELY:  *FEVER GREATER THAN 100.5 F  *CHILLS WITH OR WITHOUT FEVER  NAUSEA AND VOMITING THAT IS NOT CONTROLLED WITH YOUR NAUSEA MEDICATION  *UNUSUAL SHORTNESS OF BREATH  *UNUSUAL BRUISING OR BLEEDING  TENDERNESS IN MOUTH AND THROAT WITH OR WITHOUT PRESENCE OF ULCERS  *URINARY PROBLEMS  *BOWEL PROBLEMS  UNUSUAL RASH Items with * indicate a potential emergency and should be followed up as soon as possible.  One of the nurses will contact you 24 hours after your treatment. Please let the nurse know about any problems that you may have experienced. Feel free to call the clinic you have any questions or concerns. The clinic phone number is [phone].   I have been informed and understand all the instructions given to me. I know to contact the clinic, my physician, or go to the Emergency Department if any problems should occur. I do not have any questions at this time, but understand that I may call the clinic during office hours   should I have any questions or need assistance in obtaining follow up care.    __________________________________________  _____________  __________ Signature of Patient or Authorized Representative            Date                   Time    __________________________________________  Nurse's Signature

## 2016-04-15 ENCOUNTER — Other Ambulatory Visit: Payer: Self-pay | Admitting: Family

## 2016-04-17 ENCOUNTER — Other Ambulatory Visit: Payer: Self-pay | Admitting: Family

## 2016-04-17 ENCOUNTER — Ambulatory Visit (HOSPITAL_BASED_OUTPATIENT_CLINIC_OR_DEPARTMENT_OTHER)
Admission: RE | Admit: 2016-04-17 | Discharge: 2016-04-17 | Disposition: A | Discharge status: Home / Self Care | Payer: Medicare Other | Source: Ambulatory Visit | Attending: Family | Admitting: Family

## 2016-04-17 ENCOUNTER — Telehealth: Payer: Self-pay | Admitting: Family

## 2016-04-17 DIAGNOSIS — C679 Malignant neoplasm of bladder, unspecified: Secondary | ICD-10-CM

## 2016-04-17 DIAGNOSIS — R937 Abnormal findings on diagnostic imaging of other parts of musculoskeletal system: Secondary | ICD-10-CM | POA: Insufficient documentation

## 2016-04-17 DIAGNOSIS — C775 Secondary and unspecified malignant neoplasm of intrapelvic lymph nodes: Secondary | ICD-10-CM | POA: Diagnosis not present

## 2016-04-17 DIAGNOSIS — R590 Localized enlarged lymph nodes: Secondary | ICD-10-CM | POA: Diagnosis not present

## 2016-04-17 DIAGNOSIS — M25551 Pain in right hip: Secondary | ICD-10-CM

## 2016-04-17 DIAGNOSIS — I700 Atherosclerosis of aorta: Secondary | ICD-10-CM | POA: Insufficient documentation

## 2016-04-17 DIAGNOSIS — C7951 Secondary malignant neoplasm of bone: Secondary | ICD-10-CM | POA: Diagnosis not present

## 2016-04-17 MED ORDER — IOPAMIDOL (ISOVUE-300) INJECTION 61%
100.0000 mL | Freq: Once | INTRAVENOUS | Status: AC | PRN
  Administered 2016-04-17: 100 mL via INTRAVENOUS

## 2016-04-17 NOTE — Telephone Encounter (Signed)
Spoke with Dustin Jordan and went over CT scan results. Referral placed with rad/onc for palliative radiation to the right hip. He is in agreement with the plan. All questions were answered.

## 2016-04-18 ENCOUNTER — Other Ambulatory Visit: Payer: Self-pay | Admitting: Hematology & Oncology

## 2016-04-18 NOTE — Progress Notes (Signed)
Histology and Location of Primary Cancer: Metastatic bladder cancer   Location(s) of Symptomatic Metastases: right iliac bone   Past/Anticipated chemotherapy by medical oncology, if any: Tecentriq s/p cycle 1, Zometa 4 mg IV every 3 weeks  Pain on a scale of 0-10 is: 4/10 in his right hip area   Ambulatory status? Walker? Wheelchair?: ambulatory  SAFETY ISSUES:  Prior radiation? no  Pacemaker/ICD? no  Possible current pregnancy? no  Is the patient on methotrexate? no  Current Complaints / other details:  Patient is taking hydrocodone/ibuprofen q 6 hours.  BP (!) 167/83 (BP Location: Left Arm, Patient Position: Sitting)   Pulse 66   Temp 97.9 F (36.6 C) (Oral)   Ht 6\' 2"  (1.88 m)   Wt 197 lb 4.8 oz (89.5 kg)   SpO2 100%   BMI 25.33 kg/m     Wt Readings from Last 3 Encounters:  04/23/16 197 lb 4.8 oz (89.5 kg)  04/09/16 196 lb (88.9 kg)  04/02/16 192 lb (87.1 kg)

## 2016-04-23 ENCOUNTER — Ambulatory Visit: Payer: Medicare Other | Admitting: Hematology & Oncology

## 2016-04-23 ENCOUNTER — Other Ambulatory Visit: Payer: Medicare Other

## 2016-04-23 ENCOUNTER — Ambulatory Visit: Payer: Medicare Other

## 2016-04-23 ENCOUNTER — Ambulatory Visit
Admission: RE | Admit: 2016-04-23 | Discharge: 2016-04-23 | Disposition: A | Discharge status: Home / Self Care | Payer: Medicare Other | Source: Ambulatory Visit | Attending: Radiation Oncology | Admitting: Radiation Oncology

## 2016-04-23 ENCOUNTER — Encounter: Payer: Self-pay | Admitting: Radiation Oncology

## 2016-04-23 VITALS — BP 167/83 | HR 66 | Temp 97.9°F | Ht 74.0 in | Wt 197.3 lb

## 2016-04-23 DIAGNOSIS — C775 Secondary and unspecified malignant neoplasm of intrapelvic lymph nodes: Secondary | ICD-10-CM | POA: Diagnosis not present

## 2016-04-23 DIAGNOSIS — C7951 Secondary malignant neoplasm of bone: Secondary | ICD-10-CM | POA: Insufficient documentation

## 2016-04-23 DIAGNOSIS — N401 Enlarged prostate with lower urinary tract symptoms: Secondary | ICD-10-CM | POA: Insufficient documentation

## 2016-04-23 DIAGNOSIS — C7801 Secondary malignant neoplasm of right lung: Secondary | ICD-10-CM | POA: Insufficient documentation

## 2016-04-23 DIAGNOSIS — C779 Secondary and unspecified malignant neoplasm of lymph node, unspecified: Secondary | ICD-10-CM | POA: Insufficient documentation

## 2016-04-23 DIAGNOSIS — Z9221 Personal history of antineoplastic chemotherapy: Secondary | ICD-10-CM | POA: Insufficient documentation

## 2016-04-23 DIAGNOSIS — Z79899 Other long term (current) drug therapy: Secondary | ICD-10-CM | POA: Insufficient documentation

## 2016-04-23 DIAGNOSIS — C679 Malignant neoplasm of bladder, unspecified: Secondary | ICD-10-CM

## 2016-04-23 DIAGNOSIS — R0602 Shortness of breath: Secondary | ICD-10-CM | POA: Insufficient documentation

## 2016-04-23 DIAGNOSIS — I10 Essential (primary) hypertension: Secondary | ICD-10-CM | POA: Insufficient documentation

## 2016-04-23 DIAGNOSIS — Z9889 Other specified postprocedural states: Secondary | ICD-10-CM | POA: Insufficient documentation

## 2016-04-23 DIAGNOSIS — R350 Frequency of micturition: Secondary | ICD-10-CM | POA: Insufficient documentation

## 2016-04-23 DIAGNOSIS — Z87891 Personal history of nicotine dependence: Secondary | ICD-10-CM | POA: Insufficient documentation

## 2016-04-23 DIAGNOSIS — C7802 Secondary malignant neoplasm of left lung: Secondary | ICD-10-CM | POA: Insufficient documentation

## 2016-04-23 NOTE — Progress Notes (Signed)
Radiation Oncology         [phone] ________________________________  Initial outpatient Consultation  Name: DARION BREED MRN: [id number]  Date: 04/23/2016  DOB: September 29, 1946  [id number], [name], MD  [name], MD   REFERRING PHYSICIAN: [name], MD  DIAGNOSIS: Metastatic bladder cancer- bone and lymph node metastasis  HISTORY OF PRESENT ILLNESS::Dustin Jordan is a 70 y.o. male who is presenting to clinic today with metastatic bladder cancer.  He was previously seen by Dr. [name] during a consultation at [facility] on January 19, 2015 after being admitted from a partial bowel obstruction.  Subsequently, he had a CT scan which revealed extensive pelvic adenopathy.  He reported to Dr. [name] that he had bladder cancer removed in 2015 that was poorly differentiated.  After being discharged, the patient underwent a biopsy.    Pathology revealed metastatic bladder cancer.  The patient then had a PET scan performed, which revealed extensive metabolic disease in the pelvic lymph nodes and peritoneal implants.  There was right pelvic adenopathy with possible osseous erosion along the right sacroiliac joint. There was indeterminate activity in a porta hepatitis noted.  There is no thoracic or hepatic disease.     Since this initial consultation, he has been seen by Dr. [name] for chemotherapy of Carboplatin/Gezmar.  He also receives Zometa 4mg  delivered through an IV every 3 weeks.  After completing these cycles of chemotherapy, he began a new course on Tecentriq q every 21 days.  The patient reports that he has intermittent shortness of breath and has trouble sleeping at night.  The patient also still has right hip discomfort that radiates to the knee off and on.  Otherwise the patient is toleration chemotherapy.    He is here today to consider his options for radiotherapy with me.      PREVIOUS RADIATION THERAPY: No  PAST MEDICAL HISTORY:  has a past medical  history of Bladder cancer ([place]); Bladder cancer metastasized to intrapelvic lymph nodes ([place]) (02/02/2015); Frequency of urination; Hematuria; Hypertension; Swelling of right knee joint; and White coat hypertension.    PAST SURGICAL HISTORY: Past Surgical History:  Procedure Laterality Date  . CYSTOSCOPY N/A 02/17/2014   Procedure: CYSTOSCOPY WITH INDOCYANINE GREEN DYE;  Surgeon: Alexis Frock, MD;  Location: [facility];  Service: Urology;  Laterality: N/A;  . CYSTOSCOPY W/ RETROGRADES Bilateral 06/22/2013   Procedure: CYSTOSCOPY WITH RETROGRADE PYELOGRAM;  Surgeon: Alexis Frock, MD;  Location[facility];  Service: Urology;  Laterality: Bilateral;  . CYSTOSCOPY WITH RETROGRADE PYELOGRAM, URETEROSCOPY AND STENT PLACEMENT Bilateral 01/02/2014   Procedure: CYSTOSCOPY WITH RETROGRADE PYELOGRAM BILATERAL URETERS  AND TRANSURETHRAL RESECTION OF A BLADDER TUMOR WITH MULTIPLE BX;  Surgeon: Alexis Frock, MD;  Location: [facility];  Service: Urology;  Laterality: Bilateral;  . KNEE ARTHROSCOPY Right 06-08-2013  . LYMPHADENECTOMY Bilateral 02/17/2014   Procedure: BILATERAL LYMPH NODE DISSECTION;  Surgeon: Alexis Frock, MD;  Location: [facility];  Service: Urology;  Laterality: Bilateral;  . ROBOT ASSISTED LAPAROSCOPIC COMPLETE CYSTECT ILEAL CONDUIT N/A 02/17/2014   Procedure: ROBOTIC ASSISTED LAPAROSCOPIC PARTIAL CYSTECTOMY;  Surgeon: Alexis Frock, MD;  Location: [facility];  Service: Urology;  Laterality: N/A;  . TRANSURETHRAL RESECTION OF BLADDER TUMOR WITH GYRUS (TURBT-GYRUS) N/A 06/22/2013   Procedure: TRANSURETHRAL RESECTION OF BLADDER TUMOR WITH GYRUS (TURBT-GYRUS);  Surgeon: Alexis Frock, MD;  Location: [facility];  Service: Urology;  Laterality: N/A;    FAMILY HISTORY: family history includes Cancer in his father.  SOCIAL  HISTORY:  reports that he quit smoking about 5 years ago. His smoking use included Cigarettes. He started smoking about 33 years ago. He has a 36.00 pack-year  smoking history. He has never used smokeless tobacco. He reports that he does not drink alcohol or use drugs.  ALLERGIES: Review of patient's allergies indicates no known allergies.  MEDICATIONS:  Current Outpatient Prescriptions  Medication Sig Dispense Refill  . hydrochlorothiazide (MICROZIDE) 12.5 MG capsule TAKE 1 CAPSULE BY MOUTH EVERY MORNING 90 capsule 1  . HYDROcodone-ibuprofen (VICOPROFEN) 7.5-200 MG tablet Take 1 tablet by mouth every 8 (eight) hours as needed for moderate pain. 60 tablet 0  . tamsulosin (FLOMAX) 0.4 MG CAPS capsule TAKE 2 CAPSULES BY MOUTH EVERY DAY AS DIRECTED 180 capsule 0  . lidocaine-prilocaine (EMLA) cream Apply 1 application topically as needed. (Patient not taking: Reported on 04/23/2016) 30 g 12  . lidocaine-prilocaine (EMLA) cream Apply to affected area once (Patient not taking: Reported on 04/23/2016) 30 g 3  . naproxen (NAPROSYN) 500 MG tablet Take 1 tablet (500 mg total) by mouth 2 (two) times daily with a meal. (Patient not taking: Reported on 04/23/2016) 60 tablet 2  . ondansetron (ZOFRAN) 8 MG tablet Take 1 tablet (8 mg total) by mouth 2 (two) times daily as needed (Nausea or vomiting). (Patient not taking: Reported on 04/23/2016) 30 tablet 1  . tamsulosin (FLOMAX) 0.4 MG CAPS capsule Take 2 capsules (0.8 mg total) by mouth daily. (Patient not taking: Reported on 04/23/2016) 60 capsule 2   No current facility-administered medications for this encounter.     REVIEW OF SYSTEMS:  A 15 point review of systems is documented in the electronic medical record. This was obtained by the nursing staff. However, I reviewed this with the patient to discuss relevant findings and make appropriate changes. The patient currently takes hydrocodone as needed for his pain.  The patient reports that he takes one pill every six hours.  Patient denies headaches or changes in visual acuity. The patient reports some pain when trying to lift his right knee. The patient reports some  swelling on his right leg that waxes and wanes.  His left leg is negative for this. The patient reports that most of his pain is located on his right hip. The patient also reports that whenever he is not taking his medication, his right hip pain is extremely intense.    PHYSICAL EXAM:  height is 6\' 2"  (1.88 m) and weight is 197 lb 4.8 oz (89.5 kg). His oral temperature is 97.9 F (36.6 C). His blood pressure is 167/83 (abnormal) and his pulse is 66. His oxygen saturation is 100%.     General: Alert and oriented, in no acute distress HEENT: Head is normocephalic. Extraocular movements are intact. Oropharynx is clear. Neck: Neck is supple, no palpable cervical or supraclavicular lymphadenopathy. Heart: Regular in rate and rhythm with no murmurs, rubs, or gallops. Chest: Clear to auscultation bilaterally, with no rhonchi, wheezes, or rales. Abdomen: Soft, nontender, nondistended, with no rigidity or guarding. Extremities: No cyanosis, edema in the right leg.   Lymphatics: see Neck Exam Skin: No concerning lesions. Musculoskeletal: symmetric strength and muscle tone throughout. Neurologic: Cranial nerves II through XII are grossly intact. No obvious focalities. Speech is fluent. Coordination is intact. Psychiatric: Judgment and insight are intact. Affect is appropriate. Patient also has dentures.      ECOG =  1   LABORATORY DATA:  Lab Results  Component Value Date   WBC 6.7 04/09/2016  HGB 11.1 (L) 04/09/2016   HCT 34.1 (L) 04/09/2016   MCV 87 04/09/2016   PLT 231 04/09/2016   NEUTROABS 4.4 04/09/2016   Lab Results  Component Value Date   NA 133 04/09/2016   K 3.5 04/09/2016   CL 102 04/09/2016   CO2 28 04/09/2016   GLUCOSE 98 04/09/2016   CREATININE 0.9 04/09/2016   CALCIUM 9.3 04/09/2016      RADIOGRAPHY: Ct Pelvis W Contrast  Result Date: 04/17/2016 CLINICAL DATA:  Bladder cancer with nodal metastasis. Bone metastasis. Right hip pain. EXAM: CT PELVIS WITH CONTRAST  TECHNIQUE: Multidetector CT imaging of the pelvis was performed using the standard protocol following the bolus administration of intravenous contrast. CONTRAST:  169mL ISOVUE-300 IOPAMIDOL (ISOVUE-300) INJECTION 61% COMPARISON:  PET of 02/04/2016.  Most recent CT of 01/19/2015. FINDINGS: Urinary Tract: No distal hydroureter.  Normal urinary bladder. Bowel: normal imaged colon, terminal ileum, and appendix. Normal pelvic small bowel. Vascular/Lymphatic: Distal aortic and pelvic atherosclerosis. Right external iliac node is necrotic and measures 1.8 cm on image 25/series 2. This measured 1.6 cm on the prior PET. left external iliac node measures 9 mm on image 34/series 2 versus 8 mm previously. Reproductive: Mild prostatomegaly. Other: No significant free fluid. No pelvic peritoneal metastasis identified. Musculoskeletal: Mild bilateral hip osteoarthritis. Vague right iliac sclerosis on image 20/series 2 is similar to on the prior PET. IMPRESSION: 1. Slight increase in pelvic adenopathy. 2. Similar vague sclerosis involving the right iliac bone. Although this could be degenerative, presence of hypermetabolism on 02/04/2016 suggests an underlying osseous metastasis. Otherwise, osseous metastasis from prior PET are not readily apparent at CT. 3.  Aortic atherosclerosis. Electronically Signed   By: Abigail Miyamoto M.D.   On: 04/17/2016 08:44      IMPRESSION: Metastatic bladder cancer, patient is symptomatic from his osseus metastasis from his iliac bone.  The patient would be a good candidate for a short course of radiotherapy directed to that area.    PLAN: The patient seemed markedly concerned that the radiotherapy would affect his sexual libido and ability to have an erection.  I reassured him that the treatment area was far away from these areas and would not have any significant affect on them.  I answered all of the patient's questions and concerns otherwise and believe that he would be a good candidate for  radiotherapy. The patient reported that he would like to think about this decision and in the patient will call back if he decides to proceed with this therapy. He will continue close follow-up with Dr. [name].    ------------------------------------------------  Blair Promise, PhD, MD   This document serves as a record of services personally performed by Gery Pray, MD. It was created on his behalf by Truddie Hidden, a trained medical scribe. The creation of this record is based on the scribe's personal observations and the provider's statements to them. This document has been checked and approved by the attending provider.

## 2016-04-25 ENCOUNTER — Other Ambulatory Visit (HOSPITAL_COMMUNITY): Payer: Self-pay | Admitting: Diagnostic Radiology

## 2016-04-28 ENCOUNTER — Other Ambulatory Visit: Payer: Self-pay | Admitting: Nurse Practitioner

## 2016-04-28 DIAGNOSIS — C775 Secondary and unspecified malignant neoplasm of intrapelvic lymph nodes: Principal | ICD-10-CM

## 2016-04-28 DIAGNOSIS — C679 Malignant neoplasm of bladder, unspecified: Secondary | ICD-10-CM

## 2016-04-28 DIAGNOSIS — C7951 Secondary malignant neoplasm of bone: Secondary | ICD-10-CM

## 2016-04-28 DIAGNOSIS — M25551 Pain in right hip: Secondary | ICD-10-CM

## 2016-04-28 MED ORDER — HYDROCODONE-IBUPROFEN 7.5-200 MG PO TABS: 1.0000 | ORAL_TABLET | Freq: Four times a day (QID) | ORAL | 0 refills | Status: DC | PRN

## 2016-04-29 MED FILL — HYDROCOD-IBU 7.5-200 TAB: 7.5-200 | 23 days supply | Qty: 90 | Fill #0

## 2016-04-30 ENCOUNTER — Other Ambulatory Visit (HOSPITAL_BASED_OUTPATIENT_CLINIC_OR_DEPARTMENT_OTHER): Payer: Medicare Other

## 2016-04-30 ENCOUNTER — Ambulatory Visit: Payer: Medicare Other | Admitting: Hematology & Oncology

## 2016-04-30 ENCOUNTER — Ambulatory Visit (HOSPITAL_BASED_OUTPATIENT_CLINIC_OR_DEPARTMENT_OTHER): Payer: Medicare Other | Admitting: Hematology & Oncology

## 2016-04-30 ENCOUNTER — Ambulatory Visit (HOSPITAL_BASED_OUTPATIENT_CLINIC_OR_DEPARTMENT_OTHER): Payer: Medicare Other

## 2016-04-30 ENCOUNTER — Ambulatory Visit: Payer: Medicare Other

## 2016-04-30 ENCOUNTER — Other Ambulatory Visit: Payer: Medicare Other

## 2016-04-30 VITALS — BP 122/75 | HR 69 | Temp 97.5°F | Resp 18 | Wt 187.0 lb

## 2016-04-30 DIAGNOSIS — C7951 Secondary malignant neoplasm of bone: Secondary | ICD-10-CM

## 2016-04-30 DIAGNOSIS — C775 Secondary and unspecified malignant neoplasm of intrapelvic lymph nodes: Secondary | ICD-10-CM | POA: Diagnosis not present

## 2016-04-30 DIAGNOSIS — C679 Malignant neoplasm of bladder, unspecified: Secondary | ICD-10-CM

## 2016-04-30 DIAGNOSIS — M25551 Pain in right hip: Secondary | ICD-10-CM

## 2016-04-30 DIAGNOSIS — Z5112 Encounter for antineoplastic immunotherapy: Secondary | ICD-10-CM | POA: Diagnosis not present

## 2016-04-30 DIAGNOSIS — Z79899 Other long term (current) drug therapy: Secondary | ICD-10-CM

## 2016-04-30 LAB — TSH: TSH: 0.939 m[IU]/L (ref 0.320–4.118)

## 2016-04-30 LAB — CBC WITH DIFFERENTIAL (CANCER CENTER ONLY)
BASO#: 0 10*3/uL (ref 0.0–0.2)
BASO%: 0.1 % (ref 0.0–2.0)
EOS%: 1.3 % (ref 0.0–7.0)
Eosinophils Absolute: 0.1 10*3/uL (ref 0.0–0.5)
HEMATOCRIT: 36.3 % — AB (ref 38.7–49.9)
HEMOGLOBIN: 11.4 g/dL — AB (ref 13.0–17.1)
LYMPH#: 1.5 10*3/uL (ref 0.9–3.3)
LYMPH%: 20.4 % (ref 14.0–48.0)
MCH: 26.5 pg — ABNORMAL LOW (ref 28.0–33.4)
MCHC: 31.4 g/dL — ABNORMAL LOW (ref 32.0–35.9)
MCV: 84 fL (ref 82–98)
MONO#: 1.4 10*3/uL — ABNORMAL HIGH (ref 0.1–0.9)
MONO%: 18.4 % — AB (ref 0.0–13.0)
NEUT%: 59.8 % (ref 40.0–80.0)
NEUTROS ABS: 4.5 10*3/uL (ref 1.5–6.5)
Platelets: 263 10*3/uL (ref 145–400)
RBC: 4.3 10*6/uL (ref 4.20–5.70)
RDW: 18.7 % — ABNORMAL HIGH (ref 11.1–15.7)
WBC: 7.5 10*3/uL (ref 4.0–10.0)

## 2016-04-30 LAB — CMP (CANCER CENTER ONLY)
ALBUMIN: 3.1 g/dL — AB (ref 3.3–5.5)
ALK PHOS: 61 U/L (ref 26–84)
ALT: 32 U/L (ref 10–47)
AST: 45 U/L — AB (ref 11–38)
BILIRUBIN TOTAL: 1.1 mg/dL (ref 0.20–1.60)
BUN, Bld: 12 mg/dL (ref 7–22)
CALCIUM: 9 mg/dL (ref 8.0–10.3)
CO2: 27 mEq/L (ref 18–33)
CREATININE: 1.1 mg/dL (ref 0.6–1.2)
Chloride: 98 mEq/L (ref 98–108)
GLUCOSE: 89 mg/dL (ref 73–118)
Potassium: 3.8 mEq/L (ref 3.3–4.7)
SODIUM: 132 meq/L (ref 128–145)
Total Protein: 7.8 g/dL (ref 6.4–8.1)

## 2016-04-30 LAB — LACTATE DEHYDROGENASE: LDH: 311 U/L — AB (ref 125–245)

## 2016-04-30 MED ORDER — SODIUM CHLORIDE 0.9 % IV SOLN
Freq: Once | INTRAVENOUS | Status: AC
  Administered 2016-04-30: 12:00:00 via INTRAVENOUS

## 2016-04-30 MED ORDER — SODIUM CHLORIDE 0.9% FLUSH
10.0000 mL | INTRAVENOUS | Status: DC | PRN
  Administered 2016-04-30: 10 mL
  Filled 2016-04-30: qty 10

## 2016-04-30 MED ORDER — HEPARIN SOD (PORK) LOCK FLUSH 100 UNIT/ML IV SOLN
500.0000 [IU] | Freq: Once | INTRAVENOUS | Status: AC | PRN
  Administered 2016-04-30: 500 [IU]
  Filled 2016-04-30: qty 5

## 2016-04-30 MED ORDER — ATEZOLIZUMAB CHEMO INJECTION 1200 MG/20ML
1200.0000 mg | Freq: Once | INTRAVENOUS | Status: AC
  Administered 2016-04-30: 1200 mg via INTRAVENOUS
  Filled 2016-04-30: qty 20

## 2016-04-30 NOTE — Progress Notes (Signed)
Hematology and Oncology Follow Up Visit  Dustin Jordan [id number] [birth year] 70 y.o. 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 He is doing fairly well. He did see Dr. Sondra Come of radiation oncology. He did not wish to have any radiation right now. He still is having some pain with the right hip. He is on hydrocodone for this. This does seem to be helping him.   He was having some issues with his liver function test. These have pridefully normalize now.  He has no nausea or vomiting. He has no change in bowel or bladder habits. He has no rashes. He has no fever. He has no weight loss. He has no leg swelling.  Overall, his performance status is ECOG 0.   Medications:    Medication List       Accurate as of 04/30/16 10:01 AM. Always use your most recent med list.          hydrochlorothiazide 12.5 MG capsule Commonly known as:  MICROZIDE TAKE 1 CAPSULE BY MOUTH EVERY MORNING   HYDROcodone-ibuprofen 7.5-200 MG tablet Commonly known as:  VICOPROFEN Take 1 tablet by mouth every 6 (six) hours as needed for moderate pain.   lidocaine-prilocaine cream Commonly known as:  EMLA Apply 1 application topically as needed.   lidocaine-prilocaine cream Commonly known as:  EMLA Apply to affected area once   naproxen 500 MG tablet Commonly known as:  NAPROSYN Take 1 tablet (500 mg total) by mouth 2 (two) times daily with a meal.   ondansetron 8 MG tablet Commonly known as:  ZOFRAN Take 1 tablet (8 mg total) by mouth 2 (two) times daily as needed (Nausea or vomiting).   tamsulosin 0.4 MG Caps capsule Commonly known as:  FLOMAX Take 2 capsules (0.8 mg total) by mouth daily.   tamsulosin 0.4 MG Caps capsule Commonly known as:  FLOMAX TAKE 2 CAPSULES BY MOUTH EVERY DAY AS DIRECTED         Allergies: No Known Allergies  Past Medical History, Surgical history, Social history, and Family History were reviewed and updated.  Review of Systems: All other 10 point review of systems is negative.   Physical Exam:  weight is 187 lb (84.8 kg). His oral temperature is 97.5 F (36.4 C). His blood pressure is 122/75 and his pulse is 69. His respiration is 18.   Wt Readings from Last 3 Encounters:  04/30/16 187 lb (84.8 kg)  04/23/16 197 lb 4.8 oz (89.5 kg)  04/09/16 196 lb (88.9 kg)    Ocular: Sclerae unicteric, pupils equal, round and reactive to light Ear-nose-throat: Oropharynx clear, dentition fair Lymphatic: No cervical supraclavicular or axillary adenopathy Lungs no rales or rhonchi, good excursion bilaterally Heart regular rate and rhythm, no murmur appreciated Abd soft, nontender, positive bowel sounds, no liver or spleen tip palpated on exam, no fluid wave MSK no focal spinal tenderness, no joint edema, has pain in the right hip that radiates to his knee Neuro: non-focal, well-oriented, appropriate affect Breasts: Deferred  Lab Results  Component Value Date   WBC 7.5 04/30/2016   HGB 11.4 (L) 04/30/2016   HCT 36.3 (L) 04/30/2016   MCV 84 04/30/2016   PLT 263 04/30/2016   Lab Results  Component Value Date   FERRITIN 122 01/28/2016  IRON 51 01/28/2016   TIBC 348 01/28/2016   UIBC 297 01/28/2016   IRONPCTSAT 15 (L) 01/28/2016   Lab Results  Component Value Date   RBC 4.30 04/30/2016   No results found for: KPAFRELGTCHN, LAMBDASER, KAPLAMBRATIO No results found for: IGGSERUM, IGA, IGMSERUM No results found for: Odetta Pink, SPEI   Chemistry      Component Value Date/Time   NA 132 04/30/2016 0815   K 3.8 04/30/2016 0815   CL 98 04/30/2016 0815   CO2 27 04/30/2016 0815   BUN 12 04/30/2016 0815   CREATININE 1.1 04/30/2016 0815      Component Value Date/Time   CALCIUM 9.0 04/30/2016 0815    ALKPHOS 61 04/30/2016 0815   AST 45 (H) 04/30/2016 0815   ALT 32 04/30/2016 0815   BILITOT 1.10 04/30/2016 0815     Impression and Plan: Dustin Jordan is very pleasant 70 yo African American male with poorly differentiated metastatic bladder cancer. After receiving his first treatment with Tecentriq his LFT's became quite elevated. He has had a 3 week break. His LFT's are improved. He is still having right hip pain that radiates to his knee at times.   He saw Dr. Sondra Come of radiation oncology. Dr. Sondra Come recommended radiation but Dustin Jordan wants to hold off on radiation until after he gets his next PET scan. We will proceed with cycle 3 of Tecentriq.  After his fourth cycle of Tecentriq, then we will repeat the PET scan. I think that if he still has significant activity in the right iliac, then Mr. Cagnina might be more amenable to radiation.   [name], MD 8/2/201710:01 AM

## 2016-04-30 NOTE — Patient Instructions (Signed)
[facility] Discharge Instructions for Patients Receiving Chemotherapy  Today you received the following chemotherapy agents  TecentrioQ  To help prevent nausea and vomiting after your treatment, we encourage you to take your nausea medication as prescribed    If you develop nausea and vomiting that is not controlled by your nausea medication, call the clinic. If it is after clinic hours your family physician or the after hours number for the clinic or go to the Emergency Department.   BELOW ARE SYMPTOMS THAT SHOULD BE REPORTED IMMEDIATELY:  *FEVER GREATER THAN 100.5 F  *CHILLS WITH OR WITHOUT FEVER  NAUSEA AND VOMITING THAT IS NOT CONTROLLED WITH YOUR NAUSEA MEDICATION  *UNUSUAL SHORTNESS OF BREATH  *UNUSUAL BRUISING OR BLEEDING  TENDERNESS IN MOUTH AND THROAT WITH OR WITHOUT PRESENCE OF ULCERS  *URINARY PROBLEMS  *BOWEL PROBLEMS  UNUSUAL RASH Items with * indicate a potential emergency and should be followed up as soon as possible.  One of the nurses will contact you 24 hours after your treatment. Please let the nurse know about any problems that you may have experienced. Feel free to call the clinic you have any questions or concerns. The clinic phone number is [phone].   I have been informed and understand all the instructions given to me. I know to contact the clinic, my physician, or go to the Emergency Department if any problems should occur. I do not have any questions at this time, but understand that I may call the clinic during office hours   should I have any questions or need assistance in obtaining follow up care.    __________________________________________  _____________  __________ Signature of Patient or Authorized Representative            Date                   Time    __________________________________________  Nurse's Signature

## 2016-05-13 ENCOUNTER — Other Ambulatory Visit: Payer: Self-pay | Admitting: Family

## 2016-05-13 DIAGNOSIS — C7951 Secondary malignant neoplasm of bone: Secondary | ICD-10-CM

## 2016-05-13 MED ORDER — LIDOCAINE 5 % EX PTCH: 1.0000 | MEDICATED_PATCH | CUTANEOUS | 0 refills | Status: DC

## 2016-05-13 NOTE — Progress Notes (Signed)
Mr. 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 We will have him try a Lidoderm patch to the right hip and see if this helps. He is in agreement with the plan. Prescription sent to [facility].

## 2016-05-14 ENCOUNTER — Ambulatory Visit: Payer: Medicare Other | Admitting: Family

## 2016-05-14 ENCOUNTER — Ambulatory Visit: Payer: Medicare Other

## 2016-05-14 ENCOUNTER — Other Ambulatory Visit: Payer: Medicare Other

## 2016-05-16 ENCOUNTER — Other Ambulatory Visit: Payer: Self-pay | Admitting: Family

## 2016-05-16 DIAGNOSIS — M25551 Pain in right hip: Secondary | ICD-10-CM

## 2016-05-16 DIAGNOSIS — C679 Malignant neoplasm of bladder, unspecified: Secondary | ICD-10-CM

## 2016-05-16 DIAGNOSIS — C775 Secondary and unspecified malignant neoplasm of intrapelvic lymph nodes: Principal | ICD-10-CM

## 2016-05-16 DIAGNOSIS — C7951 Secondary malignant neoplasm of bone: Secondary | ICD-10-CM

## 2016-05-16 MED ORDER — FENTANYL 25 MCG/HR TD PT72: 25.0000 ug | MEDICATED_PATCH | TRANSDERMAL | 0 refills | Status: DC

## 2016-05-16 MED FILL — fentaNYL 25 MCG/HR PT72: 25 | 30 days supply | Qty: 10 | Fill #0

## 2016-05-16 NOTE — Progress Notes (Signed)
Lidocaine patch was unsuccessful at managing his pain. I discussed this with Dr. [name] and we will try him on a Fentanyl Duragesic patch.

## 2016-05-21 ENCOUNTER — Other Ambulatory Visit (HOSPITAL_BASED_OUTPATIENT_CLINIC_OR_DEPARTMENT_OTHER): Payer: Medicare Other

## 2016-05-21 ENCOUNTER — Ambulatory Visit (HOSPITAL_BASED_OUTPATIENT_CLINIC_OR_DEPARTMENT_OTHER): Payer: Medicare Other | Admitting: Family

## 2016-05-21 ENCOUNTER — Ambulatory Visit (HOSPITAL_BASED_OUTPATIENT_CLINIC_OR_DEPARTMENT_OTHER): Payer: Medicare Other

## 2016-05-21 ENCOUNTER — Other Ambulatory Visit: Payer: Medicare Other

## 2016-05-21 ENCOUNTER — Encounter: Payer: Self-pay | Admitting: Family

## 2016-05-21 ENCOUNTER — Ambulatory Visit: Payer: Medicare Other

## 2016-05-21 ENCOUNTER — Ambulatory Visit: Payer: Medicare Other | Admitting: Family

## 2016-05-21 VITALS — BP 124/69 | HR 68 | Temp 98.0°F | Resp 18 | Ht 74.0 in | Wt 183.0 lb

## 2016-05-21 DIAGNOSIS — C679 Malignant neoplasm of bladder, unspecified: Secondary | ICD-10-CM | POA: Diagnosis not present

## 2016-05-21 DIAGNOSIS — C775 Secondary and unspecified malignant neoplasm of intrapelvic lymph nodes: Principal | ICD-10-CM

## 2016-05-21 DIAGNOSIS — C779 Secondary and unspecified malignant neoplasm of lymph node, unspecified: Secondary | ICD-10-CM

## 2016-05-21 DIAGNOSIS — Z79899 Other long term (current) drug therapy: Secondary | ICD-10-CM

## 2016-05-21 DIAGNOSIS — C7951 Secondary malignant neoplasm of bone: Secondary | ICD-10-CM | POA: Diagnosis not present

## 2016-05-21 DIAGNOSIS — Z5112 Encounter for antineoplastic immunotherapy: Secondary | ICD-10-CM

## 2016-05-21 LAB — CMP (CANCER CENTER ONLY)
ALBUMIN: 3 g/dL — AB (ref 3.3–5.5)
ALT(SGPT): 21 U/L (ref 10–47)
AST: 31 U/L (ref 11–38)
Alkaline Phosphatase: 57 U/L (ref 26–84)
BUN, Bld: 13 mg/dL (ref 7–22)
CHLORIDE: 97 meq/L — AB (ref 98–108)
CO2: 28 meq/L (ref 18–33)
CREATININE: 1.2 mg/dL (ref 0.6–1.2)
Calcium: 9.1 mg/dL (ref 8.0–10.3)
GLUCOSE: 85 mg/dL (ref 73–118)
Potassium: 3.9 mEq/L (ref 3.3–4.7)
SODIUM: 133 meq/L (ref 128–145)
Total Bilirubin: 1 mg/dl (ref 0.20–1.60)
Total Protein: 7.9 g/dL (ref 6.4–8.1)

## 2016-05-21 LAB — CBC WITH DIFFERENTIAL (CANCER CENTER ONLY)
BASO#: 0 10*3/uL (ref 0.0–0.2)
BASO%: 0.3 % (ref 0.0–2.0)
EOS ABS: 0.1 10*3/uL (ref 0.0–0.5)
EOS%: 1.5 % (ref 0.0–7.0)
HCT: 35.1 % — ABNORMAL LOW (ref 38.7–49.9)
HEMOGLOBIN: 11 g/dL — AB (ref 13.0–17.1)
LYMPH#: 1.6 10*3/uL (ref 0.9–3.3)
LYMPH%: 21.1 % (ref 14.0–48.0)
MCH: 25.5 pg — AB (ref 28.0–33.4)
MCHC: 31.3 g/dL — AB (ref 32.0–35.9)
MCV: 81 fL — ABNORMAL LOW (ref 82–98)
MONO#: 1.1 10*3/uL — ABNORMAL HIGH (ref 0.1–0.9)
MONO%: 15.1 % — AB (ref 0.0–13.0)
NEUT%: 62 % (ref 40.0–80.0)
NEUTROS ABS: 4.7 10*3/uL (ref 1.5–6.5)
Platelets: 306 10*3/uL (ref 145–400)
RBC: 4.32 10*6/uL (ref 4.20–5.70)
RDW: 19.2 % — AB (ref 11.1–15.7)
WBC: 7.6 10*3/uL (ref 4.0–10.0)

## 2016-05-21 LAB — TSH: TSH: 1.403 m[IU]/L (ref 0.320–4.118)

## 2016-05-21 LAB — LACTATE DEHYDROGENASE: LDH: 320 U/L — AB (ref 125–245)

## 2016-05-21 MED ORDER — SODIUM CHLORIDE 0.9 % IV SOLN
Freq: Once | INTRAVENOUS | Status: AC
  Administered 2016-05-21: 11:00:00 via INTRAVENOUS

## 2016-05-21 MED ORDER — HEPARIN SOD (PORK) LOCK FLUSH 100 UNIT/ML IV SOLN
500.0000 [IU] | Freq: Once | INTRAVENOUS | Status: AC | PRN
  Administered 2016-05-21: 500 [IU]
  Filled 2016-05-21: qty 5

## 2016-05-21 MED ORDER — SODIUM CHLORIDE 0.9% FLUSH
10.0000 mL | INTRAVENOUS | Status: DC | PRN
  Administered 2016-05-21: 10 mL
  Filled 2016-05-21: qty 10

## 2016-05-21 MED ORDER — SODIUM CHLORIDE 0.9 % IV SOLN
1200.0000 mg | Freq: Once | INTRAVENOUS | Status: AC
  Administered 2016-05-21: 1200 mg via INTRAVENOUS
  Filled 2016-05-21: qty 20

## 2016-05-21 NOTE — Progress Notes (Signed)
Hematology and Oncology Follow Up Visit  JAHRI EPPERSON [id number] 1946-07-18 70 y.o. 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 He is doing much better. The fentanyl patch has really helped with his hip pain. He is also taking hydrocodone for break through pain PRN. He is wanting to hold off on palliative radiation until after his next PET scan to see how he has responded to treatment.  No weakness, swelling, numbness or tingling in his extremities. His LFT's have returned to normal. His Hgb is stable at 11.0 an MCV of 81 and platelet count is 306.  No fever, chills, n/v, cough, rash, dizziness, chest pain, palpitations, abdominal pain or changes in bowel or bladder habits.  His intermittent SOB with exertion is unchanged. No syncopal episodes or falls.  No lymphadenopathy found on assessment. No episodes of bleeding or bruising.  He has maintained a good appetite and is staying well hydrated. His weight is stable.   Medications:    Medication List       Accurate as of 05/21/16  9:34 AM. Always use your most recent med list.          fentaNYL 25 MCG/HR patch Commonly known as:  DURAGESIC - dosed mcg/hr Place 1 patch (25 mcg total) onto the skin every 3 (three) days.   hydrochlorothiazide 12.5 MG capsule Commonly known as:  MICROZIDE TAKE 1 CAPSULE BY MOUTH EVERY MORNING   HYDROcodone-ibuprofen 7.5-200 MG tablet Commonly known as:  VICOPROFEN Take 1 tablet by mouth every 6 (six) hours as needed for moderate pain.   lidocaine-prilocaine cream Commonly known as:  EMLA Apply 1 application topically as needed.   lidocaine-prilocaine cream Commonly known as:  EMLA Apply to affected area once   naproxen 500 MG tablet Commonly known as:   NAPROSYN Take 1 tablet (500 mg total) by mouth 2 (two) times daily with a meal.   ondansetron 8 MG tablet Commonly known as:  ZOFRAN Take 1 tablet (8 mg total) by mouth 2 (two) times daily as needed (Nausea or vomiting).   tamsulosin 0.4 MG Caps capsule Commonly known as:  FLOMAX Take 2 capsules (0.8 mg total) by mouth daily.   tamsulosin 0.4 MG Caps capsule Commonly known as:  FLOMAX TAKE 2 CAPSULES BY MOUTH EVERY DAY AS DIRECTED       Allergies: No Known Allergies  Past Medical History, Surgical history, Social history, and Family History were reviewed and updated.  Review of Systems: All other 10 point review of systems is negative.   Physical Exam:  height is 6\' 2"  (1.88 m) and weight is 183 lb (83 kg). His oral temperature is 98 F (36.7 C). His blood pressure is 124/69 and his pulse is 68. His respiration is 18.   Wt Readings from Last 3 Encounters:  05/21/16 183 lb (83 kg)  04/30/16 187 lb (84.8 kg)  04/23/16 197 lb 4.8 oz (89.5 kg)    Ocular: Sclerae unicteric, pupils equal, round and reactive to light Ear-nose-throat: Oropharynx clear, dentition fair Lymphatic: No cervical supraclavicular or axillary adenopathy Lungs no rales or rhonchi, good excursion bilaterally Heart regular rate and rhythm, no murmur appreciated Abd soft, nontender, positive bowel sounds, no liver or spleen tip palpated on exam, no  fluid wave MSK no focal spinal tenderness, no joint edema, has pain in the right hip that radiates to his knee Neuro: non-focal, well-oriented, appropriate affect Breasts: Deferred  Lab Results  Component Value Date   WBC 7.5 04/30/2016   HGB 11.4 (L) 04/30/2016   HCT 36.3 (L) 04/30/2016   MCV 84 04/30/2016   PLT 263 04/30/2016   Lab Results  Component Value Date   FERRITIN 122 01/28/2016   IRON 51 01/28/2016   TIBC 348 01/28/2016   UIBC 297 01/28/2016   IRONPCTSAT 15 (L) 01/28/2016   Lab Results  Component Value Date   RBC 4.30 04/30/2016   No  results found for: KPAFRELGTCHN, LAMBDASER, KAPLAMBRATIO No results found for: IGGSERUM, IGA, IGMSERUM No results found for: Odetta Pink, SPEI   Chemistry      Component Value Date/Time   NA 132 04/30/2016 0815   K 3.8 04/30/2016 0815   CL 98 04/30/2016 0815   CO2 27 04/30/2016 0815   BUN 12 04/30/2016 0815   CREATININE 1.1 04/30/2016 0815      Component Value Date/Time   CALCIUM 9.0 04/30/2016 0815   ALKPHOS 61 04/30/2016 0815   AST 45 (H) 04/30/2016 0815   ALT 32 04/30/2016 0815   BILITOT 1.10 04/30/2016 0815     Impression and Plan: [name] is very pleasant 70 yo African American male with poorly differentiated metastatic bladder cancer with skeletal metastasis. He is now tolerating Tecentriq much better and will proceed with cycle 4 today as planned.  His hip pain has improved since starting the Fentanyl patch. He also has Vicoprofen for break through pain PRN. Refill prescription was given to him for the Vicoprofen.  We will repeat his PET scan in 2 weeks to reassess response.  He has his current treatment and appointment schedule.  He will contact us with any questions or concerns. We can certainly see him sooner if need be.   Eliezer Bottom, NP 8/23/20179:34 AM

## 2016-05-21 NOTE — Patient Instructions (Signed)
[facility] Discharge Instructions for Patients Receiving Chemotherapy  Today you received the following chemotherapy agents Tecentriq  To help prevent nausea and vomiting after your treatment, we encourage you to take your nausea medication   If you develop nausea and vomiting that is not controlled by your nausea medication, call the clinic.   BELOW ARE SYMPTOMS THAT SHOULD BE REPORTED IMMEDIATELY:  *FEVER GREATER THAN 100.5 F  *CHILLS WITH OR WITHOUT FEVER  NAUSEA AND VOMITING THAT IS NOT CONTROLLED WITH YOUR NAUSEA MEDICATION  *UNUSUAL SHORTNESS OF BREATH  *UNUSUAL BRUISING OR BLEEDING  TENDERNESS IN MOUTH AND THROAT WITH OR WITHOUT PRESENCE OF ULCERS  *URINARY PROBLEMS  *BOWEL PROBLEMS  UNUSUAL RASH Items with * indicate a potential emergency and should be followed up as soon as possible.  Feel free to call the clinic you have any questions or concerns. The clinic phone number is [phone].  Please show the CHEMO ALERT CARD at check-in to the Emergency Department and triage nurse.   

## 2016-05-27 ENCOUNTER — Ambulatory Visit (INDEPENDENT_AMBULATORY_CARE_PROVIDER_SITE_OTHER): Payer: Medicare Other

## 2016-05-27 ENCOUNTER — Ambulatory Visit (INDEPENDENT_AMBULATORY_CARE_PROVIDER_SITE_OTHER): Payer: Medicare Other | Admitting: Physician Assistant

## 2016-05-27 VITALS — BP 120/82 | HR 113 | Temp 98.2°F | Resp 17 | Ht 74.0 in | Wt 178.0 lb

## 2016-05-27 DIAGNOSIS — K5900 Constipation, unspecified: Secondary | ICD-10-CM

## 2016-05-27 DIAGNOSIS — R079 Chest pain, unspecified: Secondary | ICD-10-CM | POA: Diagnosis not present

## 2016-05-27 DIAGNOSIS — R0789 Other chest pain: Secondary | ICD-10-CM | POA: Diagnosis not present

## 2016-05-27 DIAGNOSIS — R0602 Shortness of breath: Secondary | ICD-10-CM | POA: Diagnosis not present

## 2016-05-27 DIAGNOSIS — R05 Cough: Secondary | ICD-10-CM | POA: Diagnosis not present

## 2016-05-27 DIAGNOSIS — R1011 Right upper quadrant pain: Secondary | ICD-10-CM | POA: Diagnosis not present

## 2016-05-27 DIAGNOSIS — Z8551 Personal history of malignant neoplasm of bladder: Secondary | ICD-10-CM

## 2016-05-27 LAB — POCT CBC
Granulocyte percent: 65.4 % (ref 37–80)
HCT, POC: 38.3 % — AB (ref 43.5–53.7)
Hemoglobin: 12.7 g/dL — AB (ref 14.1–18.1)
Lymph, poc: 2.5 (ref 0.6–3.4)
MCH, POC: 25.6 pg — AB (ref 27–31.2)
MCHC: 33.1 g/dL (ref 31.8–35.4)
MCV: 77.3 fL — AB (ref 80–97)
MID (cbc): 1 — AB (ref 0–0.9)
MPV: 7.2 fL (ref 0–99.8)
POC Granulocyte: 6.6 (ref 2–6.9)
POC LYMPH PERCENT: 24.6 % (ref 10–50)
POC MID %: 10 % (ref 0–12)
Platelet Count, POC: 347 10*3/uL (ref 142–424)
RBC: 4.95 M/uL (ref 4.69–6.13)
RDW, POC: 20.6 %
WBC: 10.1 10*3/uL (ref 4.6–10.2)

## 2016-05-27 LAB — COMPLETE METABOLIC PANEL WITHOUT GFR
Albumin: 3.5 g/dL — ABNORMAL LOW (ref 3.6–5.1)
Alkaline Phosphatase: 72 U/L (ref 40–115)
Calcium: 10 mg/dL (ref 8.6–10.3)
GFR, Est Non African American: 63 mL/min (ref >=60)
Glucose, Bld: 88 mg/dL (ref 65–99)
Potassium: 5.2 mmol/L (ref 3.5–5.3)
Sodium: 135 mmol/L (ref 135–146)
Total Bilirubin: 0.9 mg/dL (ref 0.2–1.2)
Total Protein: 8.4 g/dL — ABNORMAL HIGH (ref 6.1–8.1)

## 2016-05-27 LAB — COMPLETE METABOLIC PANEL WITH GFR
ALT: 21 U/L (ref 9–46)
AST: 28 U/L (ref 10–35)
BUN: 15 mg/dL (ref 7–25)
CO2: 25 mmol/L (ref 20–31)
Chloride: 102 mmol/L (ref 98–110)
Creat: 1.18 mg/dL (ref 0.70–1.25)
GFR, Est African American: 72 mL/min (ref >=60)

## 2016-05-27 MED ORDER — POLYETHYLENE GLYCOL 3350 17 GM/SCOOP PO POWD: 17.0000 g | Freq: Two times a day (BID) | ORAL | 1 refills | Status: DC | PRN

## 2016-05-27 NOTE — Patient Instructions (Addendum)
[facility] will contact you for imaging of your belly and chest. I will e-mail your oncologist with your X-ray results.    For constipation   Make sure you are drinking enough water daily. Make sure you are getting enough fiber in your diet - this will make you regular - you can eat high fiber foods or use metamucil as a supplement - it is really important to drink enough water when using fiber supplements.  If your stools are hard or are formed balls or you have to strain a stool softener will help - use colace 2-3 capsule a day  For gentle treatment of constipation Use Miralax 1-2 capfuls a day until your stools are soft and regular and then decrease the usage - you can use this daily  For more aggressive treatment of constipation Use 4 capfuls of Colace and 6 doses of Miralax and drink it in 2 hours - this should result in several watery stools - if it does not repeat the next day and then go to daily miralax for a week to make sure your bowels are clean and retrained to work properly  For the most aggressive treatment of constipation Use 14 capfuls of Miralax in 1 gallon of fluid (gatoraid or water work well or a combination of the two) and drink over 12h - it is ok to eat during this time and then use Miralax 1 capful daily for about 2 weeks to prevent the constipation from returning     IF you received an x-ray today, you will receive an invoice from [facility]. Please contact [facility] at [phone] with questions or concerns regarding your invoice.   IF you received labwork today, you will receive an invoice from [employer]. Please contact [facility] at [phone] with questions or concerns regarding your invoice.   Our billing staff will not be able to assist you with questions regarding bills from these companies.  You will be contacted with the lab results as soon as they are available. The fastest way to get your  results is to activate your My Chart account. Instructions are located on the last page of this paperwork. If you have not heard from [place] regarding the results in 2 weeks, please contact this office.     Constipation, Adult Constipation is when a person has fewer than three bowel movements a week, has difficulty having a bowel movement, or has stools that are dry, hard, or larger than normal. As people grow older, constipation is more common. A low-fiber diet, not taking in enough fluids, and taking certain medicines may make constipation worse.  CAUSES   Certain medicines, such as antidepressants, pain medicine, iron supplements, antacids, and water pills.   Certain diseases, such as diabetes, irritable bowel syndrome (IBS), thyroid disease, or depression.   Not drinking enough water.   Not eating enough fiber-rich foods.   Stress or travel.   Lack of physical activity or exercise.   Ignoring the urge to have a bowel movement.   Using laxatives too much.  SIGNS AND SYMPTOMS   Having fewer than three bowel movements a week.   Straining to have a bowel movement.   Having stools that are hard, dry, or larger than normal.   Feeling full or bloated.   Pain in the lower abdomen.   Not feeling relief after having a bowel movement.  DIAGNOSIS  Your health care provider will take a medical history and perform a physical exam. Further  testing may be done for severe constipation. Some tests may include:  A barium enema X-ray to examine your rectum, colon, and, sometimes, your small intestine.   A sigmoidoscopy to examine your lower colon.   A colonoscopy to examine your entire colon. TREATMENT  Treatment will depend on the severity of your constipation and what is causing it. Some dietary treatments include drinking more fluids and eating more fiber-rich foods. Lifestyle treatments may include regular exercise. If these diet and lifestyle recommendations do not help,  your health care provider may recommend taking over-the-counter laxative medicines to help you have bowel movements. Prescription medicines may be prescribed if over-the-counter medicines do not work.  HOME CARE INSTRUCTIONS   Eat foods that have a lot of fiber, such as fruits, vegetables, whole grains, and beans.  Limit foods high in fat and processed sugars, such as french fries, hamburgers, cookies, candies, and soda.   A fiber supplement may be added to your diet if you cannot get enough fiber from foods.   Drink enough fluids to keep your urine clear or pale yellow.   Exercise regularly or as directed by your health care provider.   Go to the restroom when you have the urge to go. Do not hold it.   Only take over-the-counter or prescription medicines as directed by your health care provider. Do not take other medicines for constipation without talking to your health care provider first.  [place] IF:   You have bright red blood in your stool.   Your constipation lasts for more than 4 days or gets worse.   You have abdominal or rectal pain.   You have thin, pencil-like stools.   You have unexplained weight loss. MAKE SURE YOU:   Understand these instructions.  Will watch your condition.  Will get help right away if you are not doing well or get worse.   This information is not intended to replace advice given to you by your health care provider. Make sure you discuss any questions you have with your health care provider.   Document Released: 06/13/2004 Document Revised: 10/06/2014 Document Reviewed: 06/27/2013 Elsevier Interactive Patient Education [employer].

## 2016-05-27 NOTE — Progress Notes (Signed)
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 Located in his RUQ.  Describes it as dull. Worse in the morning. Has not associated it with food. Does not radiate. No associated numbness or tingling.  Denies difficulty breathing, pain with inspiration. Does not wake him up at night. Sleeps well.   Recent change in bowel movements. Describes them as "poop pellets". Denies blood or mucus. States he is more anxious. Weight loss of 8 lbs in the past week.   Currently undergoing treatment for bladder cancer. Has PET scan scheduled for next week.   Denies fever, chills, nausea, vomiting, palpitations, headaches.   Review of Systems  Constitutional: Negative.   Respiratory: Negative.   Cardiovascular: Positive for chest pain. Negative for palpitations and leg swelling.  Gastrointestinal: Positive for abdominal pain and constipation. Negative for abdominal distention, anal bleeding, blood in stool, diarrhea, nausea and vomiting.  Psychiatric/Behavioral: Negative for sleep disturbance. The patient is nervous/anxious.     Patient Active Problem List   Diagnosis Date Noted  . 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 Lymphadenopathy, pelvic 01/20/2015  . Partial small bowel obstruction ([place]) 01/19/2015  . Thyroid mass 10/04/2014  . Clot retention of urine 01/01/2014  . HTN (hypertension) 04/20/2013  . Hematuria 04/20/2013    Current Outpatient Prescriptions on File Prior to Visit  Medication Sig Dispense Refill  . fentaNYL (DURAGESIC - DOSED MCG/HR) 25 MCG/HR patch Place 1 patch (25 mcg total) onto the skin every 3 (three) days. 10 patch 0  . hydrochlorothiazide (MICROZIDE) 12.5 MG capsule TAKE 1 CAPSULE BY MOUTH  EVERY MORNING 90 capsule 1  . HYDROcodone-ibuprofen (VICOPROFEN) 7.5-200 MG tablet Take 1 tablet by mouth every 6 (six) hours as needed for moderate pain. 90 tablet 0  . lidocaine-prilocaine (EMLA) cream Apply 1 application topically as needed. 30 g 12  . lidocaine-prilocaine (EMLA) cream Apply to affected area once 30 g 3  . naproxen (NAPROSYN) 500 MG tablet Take 1 tablet (500 mg total) by mouth 2 (two) times daily with a meal. 60 tablet 2  . tamsulosin (FLOMAX) 0.4 MG CAPS capsule TAKE 2 CAPSULES BY MOUTH EVERY DAY AS DIRECTED 180 capsule 0   No current facility-administered medications on file prior to visit.     No Known Allergies  Objective:  BP 120/82 (BP Location: Right Arm, Patient Position: Sitting, Cuff Size: Normal)   Pulse (!) 113   Temp 98.2 F (36.8 C) (Oral)   Resp 17   Ht 6\' 2"  (1.88 m)   Wt 178 lb (80.7 kg)   SpO2 99%   BMI 22.85 kg/m   Physical Exam  Constitutional: He is oriented to person, place, and time and well-developed, well-nourished, and in no distress. No distress.  Cardiovascular: Normal rate, regular rhythm and normal heart sounds.   Pulmonary/Chest: Effort normal and breath sounds normal. No respiratory distress. He has no wheezes. He has no rales. He exhibits no tenderness.  Abdominal: Soft. Normal appearance and bowel sounds are normal. He exhibits no mass. There is no hepatosplenomegaly. There is no tenderness. There is negative Murphy's sign.  Neurological: He is alert and oriented to person, place, and time. GCS score is 15.  Skin: Skin is warm and dry.  Psychiatric: Mood, memory,  affect and judgment normal.  Vitals reviewed.   Results for orders placed or performed in visit on 05/27/16  POCT CBC  Result Value Ref Range   WBC 10.1 4.6 - 10.2 K/uL   Lymph, poc 2.5 0.6 - 3.4   POC LYMPH PERCENT 24.6 10 - 50 %L   MID (cbc) 1.0 (A) 0 - 0.9   POC MID % 10.0 0 - 12 %M   POC Granulocyte 6.6 2 - 6.9   Granulocyte percent 65.4 37 - 80 %G   RBC  4.95 4.69 - 6.13 M/uL   Hemoglobin 12.7 (A) 14.1 - 18.1 g/dL   HCT, POC 38.3 (A) 43.5 - 53.7 %   MCV 77.3 (A) 80 - 97 fL   MCH, POC 25.6 (A) 27 - 31.2 pg   MCHC 33.1 31.8 - 35.4 g/dL   RDW, POC 20.6 %   Platelet Count, POC 347 142 - 424 K/uL   MPV 7.2 0 - 99.8 fL   Dg Chest 2 View  Result Date: 05/27/2016 CLINICAL DATA:  Chest pain, cough, and shortness of breath. History of metastatic bladder cancer. EXAM: CHEST  2 VIEW COMPARISON:  PET-CT 02/04/2016 FINDINGS: A right jugular Port-A-Cath is in place with tip overlying the lower SVC. Mild leftward tracheal deviation at the level of the thoracic inlet corresponds to known right thyroid lobe enlargement. The lungs are well inflated. There are multiple new round pulmonary nodules bilaterally, the largest measuring 2.6 cm in the left upper lobe. There is no evidence of pneumonia, edema, or pneumothorax. There is slight blunting of the left costophrenic angle which may reflect a trace pleural effusion. No acute osseous abnormality is identified. IMPRESSION: New bilateral pulmonary nodules consistent with metastases. These results will be called to the ordering clinician or representative by the Radiologist Assistant, and communication documented in the PACS or zVision Dashboard. Electronically Signed   By: Logan Bores M.D.   On: 05/27/2016 15:32   EKG  Not changed from past EKG. Reviewed with Dr. Brigitte Pulse.  Assessment and Plan :  1. Other chest pain - DG Chest 2 View; Future - EKG 12-Lead 2. RUQ pain - COMPLETE METABOLIC PANEL WITH GFR - POCT CBC 3. Constipation, unspecified constipation type - DG Abd 1 View; Future - polyethylene glycol powder (GLYCOLAX/MIRALAX) powder; Take 17 g by mouth 2 (two) times daily as needed.  Dispense: 500 g; Refill: 1 4. History of bladder cancer - CT ABDOMEN W CONTRAST; Future - CT Chest W Contrast; Future  - Patient will follow-up with oncologist concerning new finding of nodules in bilateral lungs.  Mercer Pod, PA-C  [facility] 05/27/2016 2:48 PM

## 2016-05-28 ENCOUNTER — Other Ambulatory Visit: Payer: Self-pay | Admitting: Family

## 2016-05-28 DIAGNOSIS — C679 Malignant neoplasm of bladder, unspecified: Secondary | ICD-10-CM

## 2016-05-28 DIAGNOSIS — C775 Secondary and unspecified malignant neoplasm of intrapelvic lymph nodes: Principal | ICD-10-CM

## 2016-05-28 DIAGNOSIS — C7951 Secondary malignant neoplasm of bone: Secondary | ICD-10-CM

## 2016-05-28 DIAGNOSIS — M25551 Pain in right hip: Secondary | ICD-10-CM

## 2016-05-28 MED ORDER — HYDROCODONE-IBUPROFEN 10-200 MG PO TABS: 1.0000 mg | ORAL_TABLET | Freq: Four times a day (QID) | ORAL | 0 refills | Status: DC | PRN

## 2016-05-28 NOTE — Progress Notes (Signed)
Patient still having pain in hip not alleviated with the Vicoprofen. We will adjust his dose and see if this helps. Ne prescription for Vicoprofen 10/200 Q6H PRN printed and ready for patient pick up.

## 2016-05-30 ENCOUNTER — Other Ambulatory Visit: Payer: Self-pay | Admitting: Family

## 2016-05-30 DIAGNOSIS — C7951 Secondary malignant neoplasm of bone: Secondary | ICD-10-CM

## 2016-05-30 DIAGNOSIS — C775 Secondary and unspecified malignant neoplasm of intrapelvic lymph nodes: Principal | ICD-10-CM

## 2016-05-30 DIAGNOSIS — C679 Malignant neoplasm of bladder, unspecified: Secondary | ICD-10-CM

## 2016-05-30 DIAGNOSIS — G8929 Other chronic pain: Secondary | ICD-10-CM

## 2016-05-30 DIAGNOSIS — M25551 Pain in right hip: Secondary | ICD-10-CM

## 2016-05-30 MED ORDER — HYDROCODONE-IBUPROFEN 7.5-200 MG PO TABS: 1.0000 | ORAL_TABLET | ORAL | 0 refills | Status: DC | PRN

## 2016-05-30 MED FILL — HYDROCOD-IBU 7.5-200 TAB: 7.5-200 | 18 days supply | Qty: 90 | Fill #0

## 2016-06-03 ENCOUNTER — Telehealth: Payer: Self-pay | Admitting: *Deleted

## 2016-06-03 ENCOUNTER — Other Ambulatory Visit: Payer: Self-pay | Admitting: *Deleted

## 2016-06-03 DIAGNOSIS — M25551 Pain in right hip: Secondary | ICD-10-CM

## 2016-06-03 DIAGNOSIS — C775 Secondary and unspecified malignant neoplasm of intrapelvic lymph nodes: Principal | ICD-10-CM

## 2016-06-03 DIAGNOSIS — C7951 Secondary malignant neoplasm of bone: Secondary | ICD-10-CM

## 2016-06-03 DIAGNOSIS — C679 Malignant neoplasm of bladder, unspecified: Secondary | ICD-10-CM

## 2016-06-03 MED ORDER — FENTANYL 50 MCG/HR TD PT72: 50.0000 ug | MEDICATED_PATCH | TRANSDERMAL | 0 refills | Status: DC

## 2016-06-03 MED FILL — fentaNYL 50 MCG/HR PT72: 50 | 30 days supply | Qty: 10 | Fill #0

## 2016-06-03 NOTE — Telephone Encounter (Signed)
Patient called to state that he was having increased pain.  Duragesic patch increased to 50 mcg per Laverna Peace NP

## 2016-06-04 ENCOUNTER — Encounter (HOSPITAL_COMMUNITY)
Admission: RE | Admit: 2016-06-04 | Discharge: 2016-06-04 | Disposition: A | Discharge status: Home / Self Care | Payer: Medicare Other | Source: Ambulatory Visit | Attending: Family | Admitting: Family

## 2016-06-04 ENCOUNTER — Other Ambulatory Visit: Payer: Self-pay | Admitting: Hematology & Oncology

## 2016-06-04 ENCOUNTER — Ambulatory Visit: Payer: Medicare Other

## 2016-06-04 ENCOUNTER — Ambulatory Visit: Payer: Medicare Other | Admitting: Hematology & Oncology

## 2016-06-04 ENCOUNTER — Other Ambulatory Visit: Payer: Medicare Other

## 2016-06-04 DIAGNOSIS — C775 Secondary and unspecified malignant neoplasm of intrapelvic lymph nodes: Secondary | ICD-10-CM | POA: Diagnosis not present

## 2016-06-04 DIAGNOSIS — C679 Malignant neoplasm of bladder, unspecified: Secondary | ICD-10-CM | POA: Insufficient documentation

## 2016-06-04 DIAGNOSIS — C7951 Secondary malignant neoplasm of bone: Secondary | ICD-10-CM | POA: Diagnosis not present

## 2016-06-04 LAB — GLUCOSE, CAPILLARY: Glucose-Capillary: 86 mg/dL (ref 65–99)

## 2016-06-04 MED ORDER — FLUDEOXYGLUCOSE F - 18 (FDG) INJECTION
8.8200 | Freq: Once | INTRAVENOUS | Status: AC | PRN
  Administered 2016-06-04: 8.82 via INTRAVENOUS

## 2016-06-09 ENCOUNTER — Telehealth: Payer: Self-pay

## 2016-06-09 DIAGNOSIS — Z8551 Personal history of malignant neoplasm of bladder: Secondary | ICD-10-CM

## 2016-06-09 NOTE — Telephone Encounter (Signed)
[facility] needs an order changed in order to schedule.  The current order for CT Abdomen needs to be changed to a CT Abdomen Pelvis With Contrast.  Once the order is changed to a CT Abdomen Pelvis With Contrast, it can be scheduled by [facility].  Thank you.  CB#: [id number]

## 2016-06-10 ENCOUNTER — Encounter: Payer: Self-pay | Admitting: Pharmacist

## 2016-06-10 NOTE — Addendum Note (Signed)
Addended by: Jannette Spanner on: 06/10/2016 11:31 AM   Modules accepted: Orders

## 2016-06-10 NOTE — Telephone Encounter (Signed)
Done

## 2016-06-11 ENCOUNTER — Other Ambulatory Visit: Payer: Self-pay | Admitting: Family

## 2016-06-11 ENCOUNTER — Encounter: Payer: Self-pay | Admitting: Hematology & Oncology

## 2016-06-11 ENCOUNTER — Ambulatory Visit (HOSPITAL_BASED_OUTPATIENT_CLINIC_OR_DEPARTMENT_OTHER): Payer: Medicare Other

## 2016-06-11 ENCOUNTER — Ambulatory Visit (HOSPITAL_BASED_OUTPATIENT_CLINIC_OR_DEPARTMENT_OTHER): Payer: Medicare Other | Admitting: Hematology & Oncology

## 2016-06-11 ENCOUNTER — Other Ambulatory Visit (HOSPITAL_BASED_OUTPATIENT_CLINIC_OR_DEPARTMENT_OTHER): Payer: Medicare Other

## 2016-06-11 DIAGNOSIS — C679 Malignant neoplasm of bladder, unspecified: Secondary | ICD-10-CM

## 2016-06-11 DIAGNOSIS — C7800 Secondary malignant neoplasm of unspecified lung: Secondary | ICD-10-CM | POA: Diagnosis not present

## 2016-06-11 DIAGNOSIS — M25551 Pain in right hip: Secondary | ICD-10-CM

## 2016-06-11 DIAGNOSIS — C779 Secondary and unspecified malignant neoplasm of lymph node, unspecified: Secondary | ICD-10-CM

## 2016-06-11 DIAGNOSIS — C7951 Secondary malignant neoplasm of bone: Secondary | ICD-10-CM

## 2016-06-11 DIAGNOSIS — R102 Pelvic and perineal pain: Secondary | ICD-10-CM

## 2016-06-11 DIAGNOSIS — Z5111 Encounter for antineoplastic chemotherapy: Secondary | ICD-10-CM

## 2016-06-11 DIAGNOSIS — Z79899 Other long term (current) drug therapy: Secondary | ICD-10-CM | POA: Diagnosis not present

## 2016-06-11 DIAGNOSIS — C775 Secondary and unspecified malignant neoplasm of intrapelvic lymph nodes: Principal | ICD-10-CM

## 2016-06-11 LAB — CMP (CANCER CENTER ONLY)
ALK PHOS: 62 U/L (ref 26–84)
ALT: 23 U/L (ref 10–47)
AST: 30 U/L (ref 11–38)
Albumin: 3.1 g/dL — ABNORMAL LOW (ref 3.3–5.5)
BILIRUBIN TOTAL: 1 mg/dL (ref 0.20–1.60)
BUN, Bld: 16 mg/dL (ref 7–22)
CALCIUM: 9.9 mg/dL (ref 8.0–10.3)
CO2: 29 mEq/L (ref 18–33)
Chloride: 98 mEq/L (ref 98–108)
Creat: 1.2 mg/dl (ref 0.6–1.2)
GLUCOSE: 112 mg/dL (ref 73–118)
Potassium: 3.4 mEq/L (ref 3.3–4.7)
Sodium: 132 mEq/L (ref 128–145)
TOTAL PROTEIN: 8.6 g/dL — AB (ref 6.4–8.1)

## 2016-06-11 LAB — CBC WITH DIFFERENTIAL (CANCER CENTER ONLY)
BASO#: 0 10*3/uL (ref 0.0–0.2)
BASO%: 0.2 % (ref 0.0–2.0)
EOS%: 0.8 % (ref 0.0–7.0)
Eosinophils Absolute: 0.1 10*3/uL (ref 0.0–0.5)
HEMATOCRIT: 36 % — AB (ref 38.7–49.9)
HGB: 11.4 g/dL — ABNORMAL LOW (ref 13.0–17.1)
LYMPH#: 1.6 10*3/uL (ref 0.9–3.3)
LYMPH%: 18 % (ref 14.0–48.0)
MCH: 24.7 pg — ABNORMAL LOW (ref 28.0–33.4)
MCHC: 31.7 g/dL — ABNORMAL LOW (ref 32.0–35.9)
MCV: 78 fL — ABNORMAL LOW (ref 82–98)
MONO#: 1.1 10*3/uL — ABNORMAL HIGH (ref 0.1–0.9)
MONO%: 12.3 % (ref 0.0–13.0)
NEUT#: 6 10*3/uL (ref 1.5–6.5)
NEUT%: 68.7 % (ref 40.0–80.0)
PLATELETS: 369 10*3/uL (ref 145–400)
RBC: 4.62 10*6/uL (ref 4.20–5.70)
RDW: 19.3 % — AB (ref 11.1–15.7)
WBC: 8.7 10*3/uL (ref 4.0–10.0)

## 2016-06-11 LAB — TSH: TSH: 2.41 m(IU)/L (ref 0.320–4.118)

## 2016-06-11 LAB — LACTATE DEHYDROGENASE: LDH: 475 U/L — AB (ref 125–245)

## 2016-06-11 MED ORDER — LIDOCAINE-PRILOCAINE 2.5-2.5 % EX CREA: TOPICAL_CREAM | CUTANEOUS | 3 refills | Status: AC

## 2016-06-11 MED ORDER — HYDROMORPHONE HCL 1 MG/ML IJ SOLN
2.0000 mg | Freq: Once | INTRAMUSCULAR | Status: AC
  Administered 2016-06-11: 2 mg via INTRAVENOUS

## 2016-06-11 MED ORDER — SODIUM CHLORIDE 0.9 % IV SOLN
Freq: Once | INTRAVENOUS | Status: AC
  Administered 2016-06-11: 17:00:00 via INTRAVENOUS

## 2016-06-11 MED ORDER — SODIUM CHLORIDE 0.9 % IV SOLN
Freq: Once | INTRAVENOUS | Status: AC
  Administered 2016-06-11: 14:00:00 via INTRAVENOUS
  Filled 2016-06-11: qty 5

## 2016-06-11 MED ORDER — POTASSIUM CHLORIDE 2 MEQ/ML IV SOLN
Freq: Once | INTRAVENOUS | Status: AC
  Administered 2016-06-11: 11:00:00 via INTRAVENOUS
  Filled 2016-06-11: qty 10

## 2016-06-11 MED ORDER — CYANOCOBALAMIN 1000 MCG/ML IJ SOLN
INTRAMUSCULAR | Status: AC
  Filled 2016-06-11: qty 1

## 2016-06-11 MED ORDER — CYANOCOBALAMIN 1000 MCG/ML IJ SOLN
1000.0000 ug | Freq: Once | INTRAMUSCULAR | Status: AC
  Administered 2016-06-11: 1000 ug via INTRAMUSCULAR

## 2016-06-11 MED ORDER — SODIUM CHLORIDE 0.9% FLUSH
10.0000 mL | INTRAVENOUS | Status: DC | PRN
  Administered 2016-06-11: 10 mL
  Filled 2016-06-11: qty 10

## 2016-06-11 MED ORDER — PROCHLORPERAZINE MALEATE 10 MG PO TABS: 10.0000 mg | ORAL_TABLET | Freq: Four times a day (QID) | ORAL | 1 refills | Status: AC | PRN

## 2016-06-11 MED ORDER — HYDROMORPHONE HCL 1 MG/ML IJ SOLN
INTRAMUSCULAR | Status: AC
  Filled 2016-06-11: qty 2

## 2016-06-11 MED ORDER — KETOROLAC TROMETHAMINE 15 MG/ML IJ SOLN
30.0000 mg | Freq: Once | INTRAMUSCULAR | Status: AC
  Administered 2016-06-11: 30 mg via INTRAVENOUS

## 2016-06-11 MED ORDER — PALONOSETRON HCL INJECTION 0.25 MG/5ML
0.2500 mg | Freq: Once | INTRAVENOUS | Status: AC
  Administered 2016-06-11: 0.25 mg via INTRAVENOUS

## 2016-06-11 MED ORDER — FOLIC ACID 1 MG PO TABS: 1.0000 mg | ORAL_TABLET | Freq: Every day | ORAL | 3 refills | Status: AC

## 2016-06-11 MED ORDER — DEXAMETHASONE 4 MG PO TABS
ORAL_TABLET | ORAL | 1 refills | Status: AC
Start: 2016-06-11 — End: ?

## 2016-06-11 MED ORDER — HEPARIN SOD (PORK) LOCK FLUSH 100 UNIT/ML IV SOLN
500.0000 [IU] | Freq: Once | INTRAVENOUS | Status: AC | PRN
  Administered 2016-06-11: 500 [IU]
  Filled 2016-06-11: qty 5

## 2016-06-11 MED ORDER — HYDROCODONE-ACETAMINOPHEN 10-325 MG PO TABS: 1.0000 | ORAL_TABLET | Freq: Four times a day (QID) | ORAL | 0 refills | Status: DC | PRN

## 2016-06-11 MED ORDER — SODIUM CHLORIDE 0.9 % IV SOLN
60.0000 mg/m2 | Freq: Once | INTRAVENOUS | Status: AC
  Administered 2016-06-11: 123 mg via INTRAVENOUS
  Filled 2016-06-11: qty 100

## 2016-06-11 MED ORDER — SODIUM CHLORIDE 0.9 % IV SOLN
800.0000 mg | Freq: Once | INTRAVENOUS | Status: AC
  Administered 2016-06-11: 800 mg via INTRAVENOUS
  Filled 2016-06-11: qty 20

## 2016-06-11 MED ORDER — ONDANSETRON HCL 8 MG PO TABS
8.0000 mg | ORAL_TABLET | Freq: Two times a day (BID) | ORAL | 1 refills | Status: AC | PRN
Start: 2016-06-11 — End: ?

## 2016-06-11 MED FILL — HYDROCODON-APAP 10-325: 10-325 | 30 days supply | Qty: 120 | Fill #0

## 2016-06-11 NOTE — Addendum Note (Signed)
Addended by: Burney Gauze R on: 06/11/2016 04:29 PM   Modules accepted: Orders

## 2016-06-11 NOTE — Progress Notes (Signed)
Hematology and Oncology Follow Up Visit  Dustin Jordan [id number] 1946/01/30 71 y.o. 06/11/2016   Principle Diagnosis:  Metastatic bladder cancer - bone and lymph node metastasis - March  Current Therapy:   Tecentriq s/p cycle #4- Progression CDDP/Alimta - cycle #1 Zometa 4 mg IV every 3 weeks    Interim History:  Dustin Jordan is here today for a follow-up. Unfortunately, he is clearly progressed. We did go ahead and do a PET scan on him. The PET scan now shows that he has pulmonary metastasis. He has increased adenopathy in the pelvis. He has new bony metastasis.  I'm just very disappointed that immunotherapy really had no effect on him. I thought for sure, immunotherapy would help.  He still has a good performance status. He still is active. He's lost about 4 or 5 pounds since her last saw him. He's had no problem with bowels or bladder. He's had no cough. He still has pelvic pain, mostly on the right side. We'll try to get him to have radiation treatments but he has declined today.  His appetite is good. He's had no nausea or vomiting. He's had no change in bowel or bladder habits. He's had no leg swelling. Her F he is on hydrocodone for the bony pain. This does seem to be helping him.   Overall, I'll see that his performance status is ECOG 1.  Medications:    Medication List       Accurate as of 06/11/16 10:02 AM. Always use your most recent med list.          fentaNYL 25 MCG/HR patch Commonly known as:  [place] - dosed mcg/hr Place 1 patch (25 mcg total) onto the skin every 3 (three) days.   fentaNYL 50 MCG/HR Commonly known as:  DURAGESIC - dosed mcg/hr Place 1 patch (50 mcg total) onto the skin every 3 (three) days.   hydrochlorothiazide 12.5 MG capsule Commonly known as:  MICROZIDE TAKE 1 CAPSULE BY MOUTH EVERY MORNING   HYDROcodone-ibuprofen 7.5-200 MG tablet Commonly known as:  VICOPROFEN Take 1 tablet by mouth every 4 (four) hours as needed for moderate  pain.   lidocaine-prilocaine cream Commonly known as:  EMLA Apply 1 application topically as needed.   naproxen 500 MG tablet Commonly known as:  NAPROSYN Take 1 tablet (500 mg total) by mouth 2 (two) times daily with a meal.   polyethylene glycol powder powder Commonly known as:  GLYCOLAX/MIRALAX Take 17 g by mouth 2 (two) times daily as needed.   tamsulosin 0.4 MG Caps capsule Commonly known as:  FLOMAX TAKE 2 CAPSULES BY MOUTH EVERY DAY AS DIRECTED       Allergies: No Known Allergies  Past Medical History, Surgical history, Social history, and Family History were reviewed and updated.  Review of Systems: All other 10 point review of systems is negative.   Physical Exam:  height is 6\' 2"  (1.88 m) and weight is 178 lb (80.7 kg). His oral temperature is 98.1 F (36.7 C). His blood pressure is 133/86 and his pulse is 64. His respiration is 18.   Wt Readings from Last 3 Encounters:  06/11/16 178 lb (80.7 kg)  05/27/16 178 lb (80.7 kg)  05/21/16 183 lb (83 kg)    Well-developed and well-nourished African [place] male in no obvious distress. Head and neck exam shows no ocular or oral lesions. He has no palpable cervical or supraclavicular lymph nodes. Lungs are clear. He has no rales, wheezes or rhonchi. Cardiac exam  regular rate and rhythm with no murmurs, rubs or bruits. Abdomen is soft. Has good bowel sounds. There is no fluid wave. There is no palpable liver or spleen tip. Back exam shows some tenderness over the lateral right pelvis. No swelling is noted. No warmth is noted. Extremity shows no clubbing, cyanosis or edema. Skin exam shows no rashes, ecchymoses or petechia. Neurological exam shows no focal neurological deficits.  Lab Results  Component Value Date   WBC 8.7 06/11/2016   HGB 11.4 (L) 06/11/2016   HCT 36.0 (L) 06/11/2016   MCV 78 (L) 06/11/2016   PLT 369 06/11/2016   Lab Results  Component Value Date   FERRITIN 122 01/28/2016   IRON 51 01/28/2016    TIBC 348 01/28/2016   UIBC 297 01/28/2016   IRONPCTSAT 15 (L) 01/28/2016   Lab Results  Component Value Date   RBC 4.62 06/11/2016   No results found for: KPAFRELGTCHN, LAMBDASER, KAPLAMBRATIO No results found for: IGGSERUM, IGA, IGMSERUM No results found for: Odetta Pink, SPEI   Chemistry      Component Value Date/Time   NA 135 05/27/2016 1514   NA 133 05/21/2016 0908   K 5.2 05/27/2016 1514   K 3.9 05/21/2016 0908   CL 102 05/27/2016 1514   CL 97 (L) 05/21/2016 0908   CO2 25 05/27/2016 1514   CO2 28 05/21/2016 0908   BUN 15 05/27/2016 1514   BUN 13 05/21/2016 0908   CREATININE 1.18 05/27/2016 1514      Component Value Date/Time   CALCIUM 10.0 05/27/2016 1514   CALCIUM 9.1 05/21/2016 0908   ALKPHOS 72 05/27/2016 1514   ALKPHOS 57 05/21/2016 0908   AST 28 05/27/2016 1514   AST 31 05/21/2016 0908   ALT 21 05/27/2016 1514   ALT 21 05/21/2016 0908   BILITOT 0.9 05/27/2016 1514   BILITOT 1.00 05/21/2016 0908     Impression and Plan: Dustin Jordan is very pleasant 70 yo African American male with poorly differentiated metastatic bladder cancer. Again, he has progressed despite immunotherapy.  I think our option now is chemotherapy. It has been a while since he has been on any chemotherapy. He initially responded very nicely to carboplatin/Gemzar.   I think a good option would be cis-platinum/Alimta. This is recommended by  NCCN guidelines. I think he could tolerate this. Per graphic comes in with his sister. His sister is a Marine scientist. She has a good understanding of what is going on. Dustin Jordan has a good understanding of what is going on.   He knows that this is some that we can treat but cannot cure. I want to make sure that we treat him so that he will not have issues with consultations from cancer progression.  I told him that I thought that the chance of responding would be about 40% or so.  I told him about the side  effects. Hopefully, we will not have too much in the way of pancytopenia. We will have to watch out for his kidney function. I told him about the hydration that he gets with the cis-platinum.  Again, I will try 3 cycles of treatment and then repeat his scans and hopefully we will see a response.   I spent about 40 minutes with he and his sister. I want to spent time with them to explain what was going on and to let them know about changes that we need to make. They are very appreciative of  the time that we took with them.   We will go ahead with treatment today. I'm come back in 3 weeks for cycle #2.    [name], MD 9/13/201710:02 AM

## 2016-06-11 NOTE — Patient Instructions (Addendum)
[facility] Discharge Instructions for Patients Receiving Chemotherapy  Today you received the following chemotherapy agents pemetrexed and Cisplatin   To help prevent nausea and vomiting after your treatment, we encourage you to take your nausea medication:  Zofran 8mg  - Take 1 tablet by mouth 2 times daily as needed for nausea or vomiting. Start on Saturday.  Dexamethasone (Decadron) 4 mg tablet- Take 1 tab two times a day the day before Alimta chemo. Take 2 tabs the day after chemo, then take 2 tabs two times a day for 2 days.   Prochlorperazine (compazine) 10 mg tablet: Take 1 tablet by mouths ever 6 hours as needed for nausea and vomiting.   Medication to numb your Port: EMLA Cream Apply quarter size application to [place] site 1 hour prior to chemotherapy. Do NOT rub in. Cover area with plastic wrap.    If you develop nausea and vomiting that is not controlled by your nausea medication, call the clinic. If it is after clinic hours your family physician or the after hours number for the clinic or go to the Emergency Department.   BELOW ARE SYMPTOMS THAT SHOULD BE REPORTED IMMEDIATELY:  *FEVER GREATER THAN 100.5 F  *CHILLS WITH OR WITHOUT FEVER  NAUSEA AND VOMITING THAT IS NOT CONTROLLED WITH YOUR NAUSEA MEDICATION  *UNUSUAL SHORTNESS OF BREATH  *UNUSUAL BRUISING OR BLEEDING  TENDERNESS IN MOUTH AND THROAT WITH OR WITHOUT PRESENCE OF ULCERS  *URINARY PROBLEMS  *BOWEL PROBLEMS  UNUSUAL RASH Items with * indicate a potential emergency and should be followed up as soon as possible.  One of the nurses will contact you 24 hours after your treatment. Please let the nurse know about any problems that you may have experienced. Feel free to call the clinic you have any questions or concerns. The clinic phone number is [phone].   I have been informed and understand all the instructions given to me. I know to contact the clinic, my physician, or go to the  Emergency Department if any problems should occur. I do not have any questions at this time, but understand that I may call the clinic during office hours   should I have any questions or need assistance in obtaining follow up care.    __________________________________________  _____________  __________ Signature of Patient or Authorized Representative            Date                   Time    __________________________________________ Nurse's Signature

## 2016-06-11 NOTE — Progress Notes (Signed)
I spoke with Dr. [name] about patient's complaint of right hip pain. He is ok with giving [name] 30 mg IV for pain and proceeding with Alimta. He will now avoid NSAIDS while receiving Alimta. We will DC his Vicoprofen and start him on Norco 10-325.

## 2016-06-12 ENCOUNTER — Encounter: Payer: Self-pay | Admitting: Hematology & Oncology

## 2016-06-12 ENCOUNTER — Encounter: Payer: Self-pay | Admitting: Oncology

## 2016-06-13 ENCOUNTER — Encounter: Payer: Self-pay | Admitting: Nurse Practitioner

## 2016-06-17 ENCOUNTER — Other Ambulatory Visit: Payer: Self-pay | Admitting: Nurse Practitioner

## 2016-06-18 ENCOUNTER — Ambulatory Visit (HOSPITAL_BASED_OUTPATIENT_CLINIC_OR_DEPARTMENT_OTHER): Payer: Medicare Other

## 2016-06-18 ENCOUNTER — Other Ambulatory Visit: Payer: Self-pay | Admitting: Family

## 2016-06-18 VITALS — BP 126/67 | HR 65 | Temp 98.3°F | Resp 20

## 2016-06-18 DIAGNOSIS — C779 Secondary and unspecified malignant neoplasm of lymph node, unspecified: Secondary | ICD-10-CM | POA: Diagnosis not present

## 2016-06-18 DIAGNOSIS — C7951 Secondary malignant neoplasm of bone: Secondary | ICD-10-CM

## 2016-06-18 DIAGNOSIS — C679 Malignant neoplasm of bladder, unspecified: Secondary | ICD-10-CM

## 2016-06-18 DIAGNOSIS — C7800 Secondary malignant neoplasm of unspecified lung: Secondary | ICD-10-CM | POA: Diagnosis not present

## 2016-06-18 DIAGNOSIS — E860 Dehydration: Secondary | ICD-10-CM

## 2016-06-18 DIAGNOSIS — C775 Secondary and unspecified malignant neoplasm of intrapelvic lymph nodes: Principal | ICD-10-CM

## 2016-06-18 LAB — CMP (CANCER CENTER ONLY)
ALK PHOS: 56 U/L (ref 26–84)
ALT: 28 U/L (ref 10–47)
AST: 21 U/L (ref 11–38)
Albumin: 2.8 g/dL — ABNORMAL LOW (ref 3.3–5.5)
BUN: 39 mg/dL — AB (ref 7–22)
CALCIUM: 8.9 mg/dL (ref 8.0–10.3)
CHLORIDE: 100 meq/L (ref 98–108)
CO2: 28 mEq/L (ref 18–33)
Creat: 1.8 mg/dl — ABNORMAL HIGH (ref 0.6–1.2)
GLUCOSE: 109 mg/dL (ref 73–118)
POTASSIUM: 4.1 meq/L (ref 3.3–4.7)
Sodium: 131 mEq/L (ref 128–145)
Total Bilirubin: 0.7 mg/dl (ref 0.20–1.60)
Total Protein: 7 g/dL (ref 6.4–8.1)

## 2016-06-18 LAB — CBC WITH DIFFERENTIAL (CANCER CENTER ONLY)
BASO#: 0 10*3/uL (ref 0.0–0.2)
BASO%: 0 % (ref 0.0–2.0)
EOS ABS: 0 10*3/uL (ref 0.0–0.5)
EOS%: 0.3 % (ref 0.0–7.0)
HEMATOCRIT: 34.2 % — AB (ref 38.7–49.9)
HGB: 11 g/dL — ABNORMAL LOW (ref 13.0–17.1)
LYMPH#: 0.8 10*3/uL — AB (ref 0.9–3.3)
LYMPH%: 21.9 % (ref 14.0–48.0)
MCH: 24.7 pg — AB (ref 28.0–33.4)
MCHC: 32.2 g/dL (ref 32.0–35.9)
MCV: 77 fL — AB (ref 82–98)
MONO#: 0 10*3/uL — AB (ref 0.1–0.9)
MONO%: 0.3 % (ref 0.0–13.0)
NEUT#: 2.7 10*3/uL (ref 1.5–6.5)
NEUT%: 77.5 % (ref 40.0–80.0)
PLATELETS: 246 10*3/uL (ref 145–400)
RBC: 4.46 10*6/uL (ref 4.20–5.70)
RDW: 19.2 % — AB (ref 11.1–15.7)
WBC: 3.4 10*3/uL — ABNORMAL LOW (ref 4.0–10.0)

## 2016-06-18 MED ORDER — HEPARIN SOD (PORK) LOCK FLUSH 100 UNIT/ML IV SOLN
500.0000 [IU] | Freq: Once | INTRAVENOUS | Status: AC
  Administered 2016-06-18: 500 [IU] via INTRAVENOUS
  Filled 2016-06-18: qty 5

## 2016-06-18 MED ORDER — SODIUM CHLORIDE 0.9% FLUSH
10.0000 mL | INTRAVENOUS | Status: DC | PRN
  Administered 2016-06-18: 10 mL via INTRAVENOUS
  Filled 2016-06-18: qty 10

## 2016-06-18 MED ORDER — SODIUM CHLORIDE 0.9 % IV SOLN
Freq: Once | INTRAVENOUS | Status: AC
  Administered 2016-06-18: 16:00:00 via INTRAVENOUS

## 2016-06-18 NOTE — Progress Notes (Signed)
His BUN is slightly up at 1.8 with a creatinine of 39. He has had no change in bladder habits. He continues to make adequate urine and no c/o flank pain. We will give him 1/2 liter of normal saline. We will continue to monitor his lab work and plan to see him back at his next scheduled appointment on 10/4.

## 2016-06-18 NOTE — Patient Instructions (Signed)

## 2016-06-19 LAB — LACTATE DEHYDROGENASE: LDH: 273 U/L — AB (ref 125–245)

## 2016-07-01 ENCOUNTER — Ambulatory Visit (INDEPENDENT_AMBULATORY_CARE_PROVIDER_SITE_OTHER): Payer: Medicare Other | Admitting: Family Medicine

## 2016-07-01 ENCOUNTER — Ambulatory Visit (HOSPITAL_COMMUNITY)
Admission: RE | Admit: 2016-07-01 | Discharge: 2016-07-01 | Disposition: A | Discharge status: Home / Self Care | Payer: Medicare Other | Source: Ambulatory Visit | Attending: Family Medicine | Admitting: Family Medicine

## 2016-07-01 ENCOUNTER — Other Ambulatory Visit: Payer: Self-pay | Admitting: *Deleted

## 2016-07-01 ENCOUNTER — Ambulatory Visit (INDEPENDENT_AMBULATORY_CARE_PROVIDER_SITE_OTHER): Payer: Medicare Other

## 2016-07-01 ENCOUNTER — Telehealth: Payer: Self-pay | Admitting: *Deleted

## 2016-07-01 VITALS — BP 138/80 | HR 95 | Temp 98.2°F | Resp 16 | Ht 73.5 in | Wt 179.4 lb

## 2016-07-01 DIAGNOSIS — R0781 Pleurodynia: Secondary | ICD-10-CM | POA: Diagnosis not present

## 2016-07-01 DIAGNOSIS — R0600 Dyspnea, unspecified: Secondary | ICD-10-CM | POA: Insufficient documentation

## 2016-07-01 DIAGNOSIS — Z8551 Personal history of malignant neoplasm of bladder: Secondary | ICD-10-CM | POA: Insufficient documentation

## 2016-07-01 DIAGNOSIS — M25551 Pain in right hip: Secondary | ICD-10-CM

## 2016-07-01 DIAGNOSIS — C679 Malignant neoplasm of bladder, unspecified: Secondary | ICD-10-CM

## 2016-07-01 DIAGNOSIS — Z789 Other specified health status: Secondary | ICD-10-CM | POA: Diagnosis not present

## 2016-07-01 DIAGNOSIS — M899 Disorder of bone, unspecified: Secondary | ICD-10-CM | POA: Diagnosis not present

## 2016-07-01 DIAGNOSIS — C775 Secondary and unspecified malignant neoplasm of intrapelvic lymph nodes: Principal | ICD-10-CM

## 2016-07-01 DIAGNOSIS — C7951 Secondary malignant neoplasm of bone: Secondary | ICD-10-CM

## 2016-07-01 DIAGNOSIS — M7989 Other specified soft tissue disorders: Secondary | ICD-10-CM | POA: Diagnosis not present

## 2016-07-01 DIAGNOSIS — R918 Other nonspecific abnormal finding of lung field: Secondary | ICD-10-CM | POA: Insufficient documentation

## 2016-07-01 DIAGNOSIS — R079 Chest pain, unspecified: Secondary | ICD-10-CM | POA: Diagnosis not present

## 2016-07-01 DIAGNOSIS — M25471 Effusion, right ankle: Secondary | ICD-10-CM | POA: Diagnosis not present

## 2016-07-01 MED ORDER — IOPAMIDOL (ISOVUE-370) INJECTION 76%
100.0000 mL | Freq: Once | INTRAVENOUS | Status: AC | PRN
  Administered 2016-07-01: 80 mL via INTRAVENOUS

## 2016-07-01 MED ORDER — FENTANYL 75 MCG/HR TD PT72: 75.0000 ug | MEDICATED_PATCH | TRANSDERMAL | 0 refills | Status: DC

## 2016-07-01 NOTE — Progress Notes (Addendum)
Subjective:  By signing my name below, I, Dustin Jordan, attest that this documentation has been prepared under the direction and in the presence of [name], MD. Electronically Signed: Moises Jordan, [place]. 07/01/2016 , 1:35 PM .  Patient was seen in Room 9 .   Patient ID: Dustin Jordan, male    DOB: 10-08-1945, 70 y.o.   MRN: [id number] Chief Complaint  Patient presents with  . Chest Pain    right side   HPI Dustin Jordan is a 70 y.o. male  Here for right sided chest pain. H/o HTN and bladder cancer. PCP was [name], [name], MD.   Patient states he's noticed intermittent right-sided chest pain around his ribs that started 3 days ago. He notes he doesn't feel any pain when pushing over the area. He also doesn't feel any pain reproduced with twisting and turning. He has noticed shortness of breath with activity. When he takes a full deep breath, his pain moves over into the left side as well. He does have more pain when he becomes more anxious, but he denies any new stressors in life recently. He denies fever, nausea, vomiting, or heartburn.   He drove to [place] and back over the past weekend. He did notice some swelling his ankles, but denies any calf pain. He denies history of DVT or Jordan clot.   He's under current chemo treatment with Dr. 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   Patient Active Problem List   Diagnosis Date Noted  . 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 Lymphadenopathy, pelvic 01/20/2015  . Partial small bowel obstruction 01/19/2015  . Thyroid mass 10/04/2014  . Clot retention of urine 01/01/2014  . HTN (hypertension) 04/20/2013  . Hematuria 04/20/2013   Past Medical History:  Diagnosis Date  . Bladder cancer ([place])   . 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 Frequency of  urination   . Hematuria   . Hypertension   . Swelling of right knee joint    s/p arthroscopy 06-08-2013  . [place] coat hypertension    Past Surgical History:  Procedure Laterality Date  . CYSTOSCOPY N/A 02/17/2014   Procedure: CYSTOSCOPY WITH INDOCYANINE GREEN DYE;  Surgeon: Alexis Frock, MD;  Location: [facility];  Service: Urology;  Laterality: N/A;  . CYSTOSCOPY W/ RETROGRADES Bilateral 06/22/2013   Procedure: CYSTOSCOPY WITH RETROGRADE PYELOGRAM;  Surgeon: Alexis Frock, MD;  Location[facility];  Service: Urology;  Laterality: Bilateral;  . CYSTOSCOPY WITH RETROGRADE PYELOGRAM, URETEROSCOPY AND STENT PLACEMENT Bilateral 01/02/2014   Procedure: CYSTOSCOPY WITH RETROGRADE PYELOGRAM BILATERAL URETERS  AND TRANSURETHRAL RESECTION OF A BLADDER TUMOR WITH MULTIPLE BX;  Surgeon: Alexis Frock, MD;  Location: [facility];  Service: Urology;  Laterality: Bilateral;  . KNEE ARTHROSCOPY Right 06-08-2013  . LYMPHADENECTOMY Bilateral 02/17/2014   Procedure: BILATERAL LYMPH NODE DISSECTION;  Surgeon: Alexis Frock, MD;  Location: [facility];  Service: Urology;  Laterality: Bilateral;  . ROBOT ASSISTED LAPAROSCOPIC COMPLETE CYSTECT ILEAL CONDUIT N/A 02/17/2014   Procedure: ROBOTIC ASSISTED LAPAROSCOPIC PARTIAL CYSTECTOMY;  Surgeon: Alexis Frock, MD;  Location: [facility];  Service: Urology;  Laterality: N/A;  . TRANSURETHRAL RESECTION OF BLADDER TUMOR WITH GYRUS (TURBT-GYRUS) N/A 06/22/2013   Procedure: TRANSURETHRAL RESECTION OF BLADDER TUMOR WITH GYRUS (TURBT-GYRUS);  Surgeon: Alexis Frock, MD;  Location: [facility];  Service: Urology;  Laterality: N/A;  No Known Allergies Prior to Admission medications   Medication Sig Start Date End Date Taking? Authorizing Provider  dexamethasone (DECADRON) 4 MG tablet Take 1 tab two times a day the day before Al[place] chemo. Take 2 tabs the day after chemo, then take 2 tabs two times a day for 2 days. 06/11/16  Yes [name], MD  fentaNYL  (DURAGESIC - DOSED MCG/HR) 75 MCG/HR Place 1 patch (75 mcg total) onto the skin every 3 (three) days. 07/01/16  Yes [name], MD  folic acid (FOLVITE) 1 MG tablet Take 1 tablet (1 mg total) by mouth daily. Start 5-7 days before Alimta chemotherapy. Continue until 21 days after Alimta completed. 06/11/16  Yes [name], MD  hydrochlorothiazide (MICROZIDE) 12.5 MG capsule TAKE 1 CAPSULE BY MOUTH EVERY MORNING 11/21/15  Yes Wendie Agreste, MD  HYDROcodone-acetaminophen [facility]) 10-325 MG tablet Take 1 tablet by mouth every 6 (six) hours as needed for moderate pain or severe pain. 06/11/16  Yes Eliezer Bottom, NP  lidocaine-prilocaine (EMLA) cream Apply 1 application topically as needed. 05/15/15  Yes [name], MD  lidocaine-prilocaine (EMLA) cream Apply to affected area once 06/11/16  Yes [name], MD  naproxen (NAPROSYN) 500 MG tablet Take 1 tablet (500 mg total) by mouth 2 (two) times daily with a meal. 03/19/16  Yes [name], MD  ondansetron (ZOFRAN) 8 MG tablet Take 1 tablet (8 mg total) by mouth 2 (two) times daily as needed (Nausea or vomiting). Start if needed on the third day after chemotherapy. 06/11/16  Yes [name], MD  polyethylene glycol powder (GLYCOLAX/MIRALAX) powder Take 17 g by mouth 2 (two) times daily as needed. 05/27/16  Yes Elizabeth Whitney McVey, PA-C  prochlorperazine (COMPAZINE) 10 MG tablet Take 1 tablet (10 mg total) by mouth every 6 (six) hours as needed (Nausea or vomiting). 06/11/16  Yes [name], MD  tamsulosin (FLOMAX) 0.4 MG CAPS capsule TAKE 2 CAPSULES BY MOUTH EVERY DAY AS DIRECTED 04/18/16  Yes [name], MD   Social History   Social History  . Marital status: Single    Spouse name: N/A  . Number of children: 1  . Years of education: N/A   Occupational History  . Driver      part time   Social History Main Topics  . Smoking status: Former Smoker    Packs/day: 1.00    Years: 36.00    Types: Cigarettes     Start date: 03/06/1983    Quit date: 06/15/2010  . Smokeless tobacco: Never Used     Comment: quit 5 years ago  . Alcohol use No  . Drug use: No  . Sexual activity: Not on file   Other Topics Concern  . Not on file   Social History Narrative   Divorced.   Exercise: Yes   Review of Systems  Constitutional: Negative for fatigue, fever and unexpected weight change.  Eyes: Negative for visual disturbance.  Respiratory: Positive for shortness of breath. Negative for cough and chest tightness.   Cardiovascular: Positive for chest pain (right sided) and leg swelling. Negative for palpitations.  Gastrointestinal: Negative for abdominal pain, Jordan in stool, nausea and vomiting.  Neurological: Negative for dizziness, light-headedness and headaches.       Objective:   Physical Exam  Constitutional: He is oriented to person, place, and time. He appears well-developed and well-nourished.  HENT:  Head: Normocephalic and atraumatic.  Eyes: EOM are normal. Pupils  are equal, round, and reactive to light.  Neck: No JVD present. Carotid bruit is not present.  Cardiovascular: Normal rate, regular rhythm and normal heart sounds.   No murmur heard. Pulmonary/Chest: Effort normal and breath sounds normal. He has no rales.  Musculoskeletal: He exhibits no edema.  Right leg: trace, non-pitting edema in RLE Left leg: no appreciable swelling in LLE  Neurological: He is alert and oriented to person, place, and time.  Skin: Skin is warm and dry.  Psychiatric: He has a normal mood and affect.  Vitals reviewed.   Vitals:   07/01/16 1201  BP: 138/80  Pulse: 95  Resp: 16  Temp: 98.2 F (36.8 C)  TempSrc: Oral  SpO2: 96%  Weight: 179 lb 6.4 oz (81.4 kg)  Height: 6' 1.5" (1.867 m)   EKG: sinus rhythm rate 69, QTC 77, Q wave in leads III, T-wave inversion / non specific inversion in III, without appreciable change from august 29th  Dg Chest 2 View  Result Date: 07/01/2016 CLINICAL DATA:   Right-sided chest pain for 3 days, shortness of breath, history of metastatic bladder carcinoma EXAM: CHEST  2 VIEW COMPARISON:  PET-CT of 06/04/2016 and chest x-ray of 05/27/2016 FINDINGS: There are still pulmonary nodules bilaterally. These nodules appear to have decreased in size and number. The dominant nodule is in the periphery of the left mid lung base mid now measuring approximately 21 mm in diameter compared to 26 mm previously. No definite new pulmonary nodule is seen. The lungs remain hyperaerated. No pleural effusion is noted. The heart is mildly enlarged and stable. Right-sided Port-A-Cath is present with the tip in the lower SVC. IMPRESSION: 1. Decrease in size and number of pulmonary metastases. No new abnormality. 2. No change in hyper aeration. 3. Port-A-Cath tip is seen to the lower SVC. Electronically Signed   By: Ivar Drape M.D.   On: 07/01/2016 13:46      Assessment & Plan:    TAVYON MOSKWA is a 70 y.o. male Pleuritic chest pain - Plan: EKG 12-Lead, DG Chest 2 View, CT Angio Chest W/Cm &/Or Wo Cm  Dyspnea, unspecified type  Malignant neoplasm of urinary bladder, unspecified site ([place])  History of recent travel - Plan: VAS [place] LOWER EXTREMITY VENOUS (DVT)  Right ankle swelling - Plan: US Venous Img Lower Unilateral Right, VAS [place] LOWER EXTREMITY VENOUS (DVT)  History of bladder cancer with metastases, now with recent dyspnea on exertion, pleuritic chest pain past 3 days. Does have some long distance travel with episodic right ankle swelling, but attributes this to his metastases to hip. Calves were nontender, but differential includes DVT with progression to pulmonary embolus. Borderline tachycardia, but O2 sat normal.    - Check outpatient CT angiogram, and lower extremity Doppler on right. Further treatment to be determined on above results.   6:23 PM Call report received regarding CT scan. No acute pulmonary embolus. Nodules and other signs of metastatic disease as  noted prior. Will have him follow-up to discuss chest pain further in the few days, sooner if worse. Copy of note and results sent to oncologist.  Results given to patient through staff at imaging facility.  IMPRESSION: No acute pulmonary embolus.  Bilateral pulmonary nodules consistent with metastasis.  Patchy subtle lytic lucencies of the mid thoracic spine most evident at T6 consistent with metastatic disease.   Electronically Signed   By: Ashley Royalty M.D.   On: 07/01/2016 17:58  3:59 PM 07/22/16 Additional addendum, lower extremity Doppler  call report negative for DVT. Advised to return to clinic if any persistent symptoms.   Patient Instructions   Obtain the CAT scan of your chest today, as well as schedule an ultrasound of your leg to make sure there is not a Jordan clot. If any worsening of chest pain, shortness of breath or otherwise worsening, be seen in the emergency room.   Nonspecific Chest Pain  Chest pain can be caused by many different conditions. There is always a chance that your pain could be related to something serious, such as a heart attack or a Jordan clot in your lungs. Chest pain can also be caused by conditions that are not life-threatening. If you have chest pain, it is very important to follow up with your health care provider. CAUSES  Chest pain can be caused by:  Heartburn.  Pneumonia or bronchitis.  Anxiety or stress.  Inflammation around your heart (pericarditis) or lung (pleuritis or pleurisy).  A Jordan clot in your lung.  A collapsed lung (pneumothorax). It can develop suddenly on its own (spontaneous pneumothorax) or from trauma to the chest.  Shingles infection (varicella-zoster virus).  Heart attack.  Damage to the bones, muscles, and cartilage that make up your chest wall. This can include:  Bruised bones due to injury.  Strained muscles or cartilage due to frequent or repeated coughing or overwork.  Fracture to one or more  ribs.  Sore cartilage due to inflammation (costochondritis). RISK FACTORS  Risk factors for chest pain may include:  Activities that increase your risk for trauma or injury to your chest.  Respiratory infections or conditions that cause frequent coughing.  Medical conditions or overeating that can cause heartburn.  Heart disease or family history of heart disease.  Conditions or health behaviors that increase your risk of developing a Jordan clot.  Having had chicken pox (varicella zoster). SIGNS AND SYMPTOMS Chest pain can feel like:  Burning or tingling on the surface of your chest or deep in your chest.  Crushing, pressure, aching, or squeezing pain.  Dull or sharp pain that is worse when you move, cough, or take a deep breath.  Pain that is also felt in your back, neck, shoulder, or arm, or pain that spreads to any of these areas. Your chest pain may come and go, or it may stay constant. DIAGNOSIS Lab tests or other studies may be needed to find the cause of your pain. Your health care provider may have you take a test called an ambulatory ECG (electrocardiogram). An ECG records your heartbeat patterns at the time the test is performed. You may also have other tests, such as:  Transthoracic echocardiogram (TTE). During echocardiography, sound waves are used to create a picture of all of the heart structures and to look at how Jordan flows through your heart.  Transesophageal echocardiogram (TEE).This is a more advanced imaging test that obtains images from inside your body. It allows your health care provider to see your heart in finer detail.  Cardiac monitoring. This allows your health care provider to monitor your heart rate and rhythm in real time.  Holter monitor. This is a portable device that records your heartbeat and can help to diagnose abnormal heartbeats. It allows your health care provider to track your heart activity for several days, if needed.  Stress tests.  These can be done through exercise or by taking medicine that makes your heart beat more quickly.  Jordan tests.  Imaging tests. TREATMENT  Your treatment depends on  what is causing your chest pain. Treatment may include:  Medicines. These may include:  Acid blockers for heartburn.  Anti-inflammatory medicine.  Pain medicine for inflammatory conditions.  Antibiotic medicine, if an infection is present.  Medicines to dissolve Jordan clots.  Medicines to treat coronary artery disease.  Supportive care for conditions that do not require medicines. This may include:  Resting.  Applying heat or cold packs to injured areas.  Limiting activities until pain decreases. HOME CARE INSTRUCTIONS  If you were prescribed an antibiotic medicine, finish it all even if you start to feel better.  Avoid any activities that bring on chest pain.  Do not use any tobacco products, including cigarettes, chewing tobacco, or electronic cigarettes. If you need help quitting, ask your health care provider.  Do not drink alcohol.  Take medicines only as directed by your health care provider.  Keep all follow-up visits as directed by your health care provider. This is important. This includes any further testing if your chest pain does not go away.  If heartburn is the cause for your chest pain, you may be told to keep your head raised (elevated) while sleeping. This reduces the chance that acid will go from your stomach into your esophagus.  Make lifestyle changes as directed by your health care provider. These may include:  Getting regular exercise. Ask your health care provider to suggest some activities that are safe for you.  Eating a heart-healthy diet. A registered dietitian can help you to learn healthy eating options.  Maintaining a healthy weight.  Managing diabetes, if necessary.  Reducing stress. SEEK MEDICAL CARE IF:  Your chest pain does not go away after treatment.  You have  a rash with blisters on your chest.  You have a fever. SEEK IMMEDIATE MEDICAL CARE IF:   Your chest pain is worse.  You have an increasing cough, or you cough up Jordan.  You have severe abdominal pain.  You have severe weakness.  You faint.  You have chills.  You have sudden, unexplained chest discomfort.  You have sudden, unexplained discomfort in your arms, back, neck, or jaw.  You have shortness of breath at any time.  You suddenly start to sweat, or your skin gets clammy.  You feel nauseous or you vomit.  You suddenly feel light-headed or dizzy.  Your heart begins to beat quickly, or it feels like it is skipping beats. These symptoms may represent a serious problem that is an emergency. Do not wait to see if the symptoms will go away. Get medical help right away. Call your local emergency services (911 in the [place] Do not drive yourself to the hospital.   This information is not intended to replace advice given to you by your health care provider. Make sure you discuss any questions you have with your health care provider.   Document Released: 06/25/2005 Document Revised: 10/06/2014 Document Reviewed: 04/21/2014 Elsevier Interactive Patient Education 2016 [employer].    IF you received an x-ray today, you will receive an invoice from [facility]. Please contact [facility] at [phone] with questions or concerns regarding your invoice.   IF you received labwork today, you will receive an invoice from [employer]. Please contact [facility] at [phone] with questions or concerns regarding your invoice.   Our billing staff will not be able to assist you with questions regarding bills from these companies.  You will be contacted with the lab results as soon as they are available. The  fastest way to get your results is to activate your My Chart account. Instructions are located on the last page of this paperwork. If you  have not heard from [place] regarding the results in 2 weeks, please contact this office.        I personally performed the services described in this documentation, which was scribed in my presence. The recorded information has been reviewed and considered, and addended by me as needed.   Signed,   [name], MD [facility].  07/01/16 5:12 PM

## 2016-07-01 NOTE — Patient Instructions (Addendum)
Obtain the CAT scan of your chest today, as well as schedule an ultrasound of your leg to make sure there is not a blood clot. If any worsening of chest pain, shortness of breath or otherwise worsening, be seen in the emergency room.   Nonspecific Chest Pain  Chest pain can be caused by many different conditions. There is always a chance that your pain could be related to something serious, such as a heart attack or a blood clot in your lungs. Chest pain can also be caused by conditions that are not life-threatening. If you have chest pain, it is very important to follow up with your health care provider. CAUSES  Chest pain can be caused by:  Heartburn.  Pneumonia or bronchitis.  Anxiety or stress.  Inflammation around your heart (pericarditis) or lung (pleuritis or pleurisy).  A blood clot in your lung.  A collapsed lung (pneumothorax). It can develop suddenly on its own (spontaneous pneumothorax) or from trauma to the chest.  Shingles infection (varicella-zoster virus).  Heart attack.  Damage to the bones, muscles, and cartilage that make up your chest wall. This can include:  Bruised bones due to injury.  Strained muscles or cartilage due to frequent or repeated coughing or overwork.  Fracture to one or more ribs.  Sore cartilage due to inflammation (costochondritis). RISK FACTORS  Risk factors for chest pain may include:  Activities that increase your risk for trauma or injury to your chest.  Respiratory infections or conditions that cause frequent coughing.  Medical conditions or overeating that can cause heartburn.  Heart disease or family history of heart disease.  Conditions or health behaviors that increase your risk of developing a blood clot.  Having had chicken pox (varicella zoster). SIGNS AND SYMPTOMS Chest pain can feel like:  Burning or tingling on the surface of your chest or deep in your chest.  Crushing, pressure, aching, or squeezing  pain.  Dull or sharp pain that is worse when you move, cough, or take a deep breath.  Pain that is also felt in your back, neck, shoulder, or arm, or pain that spreads to any of these areas. Your chest pain may come and go, or it may stay constant. DIAGNOSIS Lab tests or other studies may be needed to find the cause of your pain. Your health care provider may have you take a test called an ambulatory ECG (electrocardiogram). An ECG records your heartbeat patterns at the time the test is performed. You may also have other tests, such as:  Transthoracic echocardiogram (TTE). During echocardiography, sound waves are used to create a picture of all of the heart structures and to look at how blood flows through your heart.  Transesophageal echocardiogram (TEE).This is a more advanced imaging test that obtains images from inside your body. It allows your health care provider to see your heart in finer detail.  Cardiac monitoring. This allows your health care provider to monitor your heart rate and rhythm in real time.  Holter monitor. This is a portable device that records your heartbeat and can help to diagnose abnormal heartbeats. It allows your health care provider to track your heart activity for several days, if needed.  Stress tests. These can be done through exercise or by taking medicine that makes your heart beat more quickly.  Blood tests.  Imaging tests. TREATMENT  Your treatment depends on what is causing your chest pain. Treatment may include:  Medicines. These may include:  Acid blockers for heartburn.  Anti-inflammatory  medicine.  Pain medicine for inflammatory conditions.  Antibiotic medicine, if an infection is present.  Medicines to dissolve blood clots.  Medicines to treat coronary artery disease.  Supportive care for conditions that do not require medicines. This may include:  Resting.  Applying heat or cold packs to injured areas.  Limiting activities  until pain decreases. HOME CARE INSTRUCTIONS  If you were prescribed an antibiotic medicine, finish it all even if you start to feel better.  Avoid any activities that bring on chest pain.  Do not use any tobacco products, including cigarettes, chewing tobacco, or electronic cigarettes. If you need help quitting, ask your health care provider.  Do not drink alcohol.  Take medicines only as directed by your health care provider.  Keep all follow-up visits as directed by your health care provider. This is important. This includes any further testing if your chest pain does not go away.  If heartburn is the cause for your chest pain, you may be told to keep your head raised (elevated) while sleeping. This reduces the chance that acid will go from your stomach into your esophagus.  Make lifestyle changes as directed by your health care provider. These may include:  Getting regular exercise. Ask your health care provider to suggest some activities that are safe for you.  Eating a heart-healthy diet. A registered dietitian can help you to learn healthy eating options.  Maintaining a healthy weight.  Managing diabetes, if necessary.  Reducing stress. SEEK MEDICAL CARE IF:  Your chest pain does not go away after treatment.  You have a rash with blisters on your chest.  You have a fever. SEEK IMMEDIATE MEDICAL CARE IF:   Your chest pain is worse.  You have an increasing cough, or you cough up blood.  You have severe abdominal pain.  You have severe weakness.  You faint.  You have chills.  You have sudden, unexplained chest discomfort.  You have sudden, unexplained discomfort in your arms, back, neck, or jaw.  You have shortness of breath at any time.  You suddenly start to sweat, or your skin gets clammy.  You feel nauseous or you vomit.  You suddenly feel light-headed or dizzy.  Your heart begins to beat quickly, or it feels like it is skipping beats. These  symptoms may represent a serious problem that is an emergency. Do not wait to see if the symptoms will go away. Get medical help right away. Call your local emergency services (911 in the [place] Do not drive yourself to the hospital.   This information is not intended to replace advice given to you by your health care provider. Make sure you discuss any questions you have with your health care provider.   Document Released: 06/25/2005 Document Revised: 10/06/2014 Document Reviewed: 04/21/2014 Elsevier Interactive Patient Education 2016 [employer].    IF you received an x-ray today, you will receive an invoice from [facility]. Please contact [facility] at [phone] with questions or concerns regarding your invoice.   IF you received labwork today, you will receive an invoice from [employer]. Please contact [facility] at [phone] with questions or concerns regarding your invoice.   Our billing staff will not be able to assist you with questions regarding bills from these companies.  You will be contacted with the lab results as soon as they are available. The fastest way to get your results is to activate your My Chart account. Instructions are located on the last page  of this paperwork. If you have not heard from [place] regarding the results in 2 weeks, please contact this office.

## 2016-07-01 NOTE — Telephone Encounter (Signed)
Patient calling for medication refill on his fentanyl patches. He states the patch isn't as effective as it was and would like the dose to be increased. Current dose 49mcg q3days. Dose will be increased to 38mcg q3days. Prescription routed to Dr [name]. Patient knows prescription will be ready for pick up tomorrow.

## 2016-07-02 ENCOUNTER — Ambulatory Visit (HOSPITAL_BASED_OUTPATIENT_CLINIC_OR_DEPARTMENT_OTHER): Payer: Medicare Other

## 2016-07-02 ENCOUNTER — Telehealth: Payer: Self-pay

## 2016-07-02 ENCOUNTER — Ambulatory Visit (HOSPITAL_COMMUNITY): Admission: RE | Admit: 2016-07-02 | Payer: Medicare Other | Source: Ambulatory Visit

## 2016-07-02 ENCOUNTER — Ambulatory Visit (HOSPITAL_BASED_OUTPATIENT_CLINIC_OR_DEPARTMENT_OTHER): Payer: Medicare Other | Admitting: Hematology & Oncology

## 2016-07-02 ENCOUNTER — Other Ambulatory Visit (HOSPITAL_BASED_OUTPATIENT_CLINIC_OR_DEPARTMENT_OTHER): Payer: Medicare Other

## 2016-07-02 VITALS — BP 132/72 | HR 84 | Temp 98.3°F | Resp 18 | Wt 183.0 lb

## 2016-07-02 DIAGNOSIS — C7800 Secondary malignant neoplasm of unspecified lung: Secondary | ICD-10-CM

## 2016-07-02 DIAGNOSIS — C7951 Secondary malignant neoplasm of bone: Secondary | ICD-10-CM | POA: Diagnosis not present

## 2016-07-02 DIAGNOSIS — C779 Secondary and unspecified malignant neoplasm of lymph node, unspecified: Secondary | ICD-10-CM

## 2016-07-02 DIAGNOSIS — C679 Malignant neoplasm of bladder, unspecified: Secondary | ICD-10-CM

## 2016-07-02 DIAGNOSIS — C775 Secondary and unspecified malignant neoplasm of intrapelvic lymph nodes: Principal | ICD-10-CM

## 2016-07-02 DIAGNOSIS — Z79899 Other long term (current) drug therapy: Secondary | ICD-10-CM

## 2016-07-02 DIAGNOSIS — R319 Hematuria, unspecified: Secondary | ICD-10-CM

## 2016-07-02 DIAGNOSIS — Z5111 Encounter for antineoplastic chemotherapy: Secondary | ICD-10-CM

## 2016-07-02 LAB — CMP (CANCER CENTER ONLY)
ALK PHOS: 52 U/L (ref 26–84)
ALT: 23 U/L (ref 10–47)
AST: 32 U/L (ref 11–38)
Albumin: 3.1 g/dL — ABNORMAL LOW (ref 3.3–5.5)
BILIRUBIN TOTAL: 0.9 mg/dL (ref 0.20–1.60)
BUN, Bld: 16 mg/dL (ref 7–22)
CALCIUM: 9.7 mg/dL (ref 8.0–10.3)
CO2: 29 mEq/L (ref 18–33)
Chloride: 97 mEq/L — ABNORMAL LOW (ref 98–108)
Creat: 1.4 mg/dl — ABNORMAL HIGH (ref 0.6–1.2)
Glucose, Bld: 116 mg/dL (ref 73–118)
POTASSIUM: 3.2 meq/L — AB (ref 3.3–4.7)
Sodium: 133 mEq/L (ref 128–145)
TOTAL PROTEIN: 7.4 g/dL (ref 6.4–8.1)

## 2016-07-02 LAB — CBC WITH DIFFERENTIAL (CANCER CENTER ONLY)
BASO#: 0 10*3/uL (ref 0.0–0.2)
BASO%: 0.4 % (ref 0.0–2.0)
EOS%: 0.8 % (ref 0.0–7.0)
Eosinophils Absolute: 0 10*3/uL (ref 0.0–0.5)
HEMATOCRIT: 32.2 % — AB (ref 38.7–49.9)
HGB: 10.3 g/dL — ABNORMAL LOW (ref 13.0–17.1)
LYMPH#: 1.3 10*3/uL (ref 0.9–3.3)
LYMPH%: 25.9 % (ref 14.0–48.0)
MCH: 25.4 pg — ABNORMAL LOW (ref 28.0–33.4)
MCHC: 32 g/dL (ref 32.0–35.9)
MCV: 80 fL — AB (ref 82–98)
MONO#: 1 10*3/uL — AB (ref 0.1–0.9)
MONO%: 21 % — ABNORMAL HIGH (ref 0.0–13.0)
NEUT#: 2.5 10*3/uL (ref 1.5–6.5)
NEUT%: 51.9 % (ref 40.0–80.0)
PLATELETS: 203 10*3/uL (ref 145–400)
RBC: 4.05 10*6/uL — AB (ref 4.20–5.70)
RDW: 22.7 % — ABNORMAL HIGH (ref 11.1–15.7)
WBC: 4.8 10*3/uL (ref 4.0–10.0)

## 2016-07-02 LAB — LACTATE DEHYDROGENASE: LDH: 299 U/L — ABNORMAL HIGH (ref 125–245)

## 2016-07-02 LAB — TSH: TSH: 3.348 m(IU)/L (ref 0.320–4.118)

## 2016-07-02 MED ORDER — SODIUM CHLORIDE 0.9% FLUSH
10.0000 mL | INTRAVENOUS | Status: DC | PRN
  Administered 2016-07-02: 10 mL
  Filled 2016-07-02: qty 10

## 2016-07-02 MED ORDER — SODIUM CHLORIDE 0.9 % IV SOLN
390.0000 mg/m2 | Freq: Once | INTRAVENOUS | Status: AC
  Administered 2016-07-02: 800 mg via INTRAVENOUS
  Filled 2016-07-02: qty 20

## 2016-07-02 MED ORDER — CISPLATIN CHEMO INJECTION 100MG/100ML
60.0000 mg/m2 | Freq: Once | INTRAVENOUS | Status: AC
  Administered 2016-07-02: 123 mg via INTRAVENOUS
  Filled 2016-07-02: qty 123

## 2016-07-02 MED ORDER — ZOLEDRONIC ACID 4 MG/100ML IV SOLN
4.0000 mg | Freq: Once | INTRAVENOUS | Status: AC
  Administered 2016-07-02: 4 mg via INTRAVENOUS
  Filled 2016-07-02: qty 100

## 2016-07-02 MED ORDER — PALONOSETRON HCL INJECTION 0.25 MG/5ML
0.2500 mg | Freq: Once | INTRAVENOUS | Status: AC
  Administered 2016-07-02: 0.25 mg via INTRAVENOUS

## 2016-07-02 MED ORDER — FOSAPREPITANT DIMEGLUMINE INJECTION 150 MG
Freq: Once | INTRAVENOUS | Status: AC
  Administered 2016-07-02: 13:00:00 via INTRAVENOUS
  Filled 2016-07-02: qty 5

## 2016-07-02 MED ORDER — POTASSIUM CHLORIDE 2 MEQ/ML IV SOLN
Freq: Once | INTRAVENOUS | Status: AC
  Administered 2016-07-02: 10:00:00 via INTRAVENOUS
  Filled 2016-07-02: qty 10

## 2016-07-02 MED ORDER — PALONOSETRON HCL INJECTION 0.25 MG/5ML
INTRAVENOUS | Status: AC
  Filled 2016-07-02: qty 5

## 2016-07-02 MED ORDER — HEPARIN SOD (PORK) LOCK FLUSH 100 UNIT/ML IV SOLN
500.0000 [IU] | Freq: Once | INTRAVENOUS | Status: AC | PRN
  Administered 2016-07-02: 500 [IU]
  Filled 2016-07-02: qty 5

## 2016-07-02 MED FILL — fentaNYL 75 MCG/HR PT72: 75 | 30 days supply | Qty: 10 | Fill #0

## 2016-07-02 NOTE — Progress Notes (Signed)
Hematology and Oncology Follow Up Visit  HALID ORMISTON [id number] [birth year] 70 y.o. 07/02/2016   Principle Diagnosis:  Metastatic bladder cancer - bone and lymph node metastasis - March  Current Therapy:   Tecentriq s/p cycle #4- Progression CDDP/Alimta - cycle #1 Zometa 4 mg IV every 3 weeks    Interim History:  Mr. Bork is here today for a follow-up. Overall, I that he is tolerated his chemotherapy pretty well.  Did have a CT angiogram done yesterday. He probably went to his family doctor. He's having some chest discomfort. Is having some shortness of breath. The CT angiogram did not show any evidence of a pulmonary embolism. It's possible that this discomfort could be from his underlying malignancy with metastatic disease to his lungs.   He is gaining away a little weight. He is eating a little bit better. He has not had any nausea or vomiting.   He is still having problems with pain in the right hip. This can be treated with some local radiation therapy. We have talked to him about this in the past. He just has been very reluctant to do this because of his concern as to how this will affect his libido. I told him that this would not affect his libido. He still is hesitant to do this.   He is urinating well. He's having no problems with diarrhea or constipation.   He has had no leg swelling. He has had no fever. He has had no mouth sores.   Overall, his performance status is ECOG 1.  Medications:    Medication List       Accurate as of 07/02/16  9:45 AM. Always use your most recent med list.          dexamethasone 4 MG tablet Commonly known as:  DECADRON Take 1 tab two times a day the day before Alimta chemo. Take 2 tabs the day after chemo, then take 2 tabs two times a day for 2 days.   fentaNYL 25 MCG/HR patch Commonly known as:  DURAGESIC - dosed mcg/hr   fentaNYL 50 MCG/HR Commonly known as:  DURAGESIC - dosed mcg/hr   fentaNYL 75 MCG/HR Commonly known  as:  DURAGESIC - dosed mcg/hr Place 1 patch (75 mcg total) onto the skin every 3 (three) days.   folic acid 1 MG tablet Commonly known as:  FOLVITE Take 1 tablet (1 mg total) by mouth daily. Start 5-7 days before Alimta chemotherapy. Continue until 21 days after Alimta completed.   hydrochlorothiazide 12.5 MG capsule Commonly known as:  MICROZIDE TAKE 1 CAPSULE BY MOUTH EVERY MORNING   HYDROcodone-acetaminophen 10-325 MG tablet Commonly known as:  NORCO Take 1 tablet by mouth every 6 (six) hours as needed for moderate pain or severe pain.   HYDROcodone-ibuprofen 7.5-200 MG tablet Commonly known as:  VICOPROFEN   lidocaine-prilocaine cream Commonly known as:  EMLA Apply 1 application topically as needed.   lidocaine-prilocaine cream Commonly known as:  EMLA Apply to affected area once   naproxen 500 MG tablet Commonly known as:  NAPROSYN Take 1 tablet (500 mg total) by mouth 2 (two) times daily with a meal.   ondansetron 8 MG tablet Commonly known as:  ZOFRAN Take 1 tablet (8 mg total) by mouth 2 (two) times daily as needed (Nausea or vomiting). Start if needed on the third day after chemotherapy.   polyethylene glycol powder powder Commonly known as:  GLYCOLAX/MIRALAX Take 17 g by mouth 2 (two) times daily as  needed.   prochlorperazine 10 MG tablet Commonly known as:  COMPAZINE Take 1 tablet (10 mg total) by mouth every 6 (six) hours as needed (Nausea or vomiting).   tamsulosin 0.4 MG Caps capsule Commonly known as:  FLOMAX TAKE 2 CAPSULES BY MOUTH EVERY DAY AS DIRECTED       Allergies: No Known Allergies  Past Medical History, Surgical history, Social history, and Family History were reviewed and updated.  Review of Systems: All other 10 point review of systems is negative.   Physical Exam:  weight is 183 lb (83 kg). His oral temperature is 98.3 F (36.8 C). His blood pressure is 132/72 and his pulse is 84. His respiration is 18.   Wt Readings from Last 3  Encounters:  07/02/16 183 lb (83 kg)  07/01/16 179 lb 6.4 oz (81.4 kg)  06/11/16 178 lb (80.7 kg)    Well-developed and well-nourished African [place] male in no obvious distress. Head and neck exam shows no ocular or oral lesions. He has no palpable cervical or supraclavicular lymph nodes. Lungs are clear. He has no rales, wheezes or rhonchi. Cardiac exam regular rate and rhythm with no murmurs, rubs or bruits. Abdomen is soft. Has good bowel sounds. There is no fluid wave. There is no palpable liver or spleen tip. Back exam shows some tenderness over the lateral right pelvis. No swelling is noted. No warmth is noted. Extremity shows no clubbing, cyanosis or edema. Skin exam shows no rashes, ecchymoses or petechia. Neurological exam shows no focal neurological deficits.  Lab Results  Component Value Date   WBC 4.8 07/02/2016   HGB 10.3 (L) 07/02/2016   HCT 32.2 (L) 07/02/2016   MCV 80 (L) 07/02/2016   PLT 203 07/02/2016   Lab Results  Component Value Date   FERRITIN 122 01/28/2016   IRON 51 01/28/2016   TIBC 348 01/28/2016   UIBC 297 01/28/2016   IRONPCTSAT 15 (L) 01/28/2016   Lab Results  Component Value Date   RBC 4.05 (L) 07/02/2016   No results found for: KPAFRELGTCHN, LAMBDASER, KAPLAMBRATIO No results found for: IGGSERUM, IGA, IGMSERUM No results found for: Odetta Pink, SPEI   Chemistry      Component Value Date/Time   NA 131 06/18/2016 1419   K 4.1 06/18/2016 1419   CL 100 06/18/2016 1419   CO2 28 06/18/2016 1419   BUN 39 (H) 06/18/2016 1419   CREATININE 1.8 (H) 06/18/2016 1419      Component Value Date/Time   CALCIUM 8.9 06/18/2016 1419   ALKPHOS 56 06/18/2016 1419   AST 21 06/18/2016 1419   ALT 28 06/18/2016 1419   BILITOT 0.70 06/18/2016 1419     Impression and Plan: Mr. Hull is very pleasant 70 yo African American male with poorly differentiated metastatic bladder cancer. Again, he has progressed  despite immunotherapy.  Hopefully, the cis-platinum/Alimta is helping. I that it is too early for [place] to and know this.   We'll go ahead and give him his second cycle of treatment. I don't see any problems with him getting this. His labs look okay.   After his third cycle of treatment, then we will order another PET scan and see how things look.  He will think about the possibility of radiation to the right hip. It is still quite difficult for him to make that commitment to the radiation.    [name], MD 10/4/20179:45 AM

## 2016-07-02 NOTE — Telephone Encounter (Signed)
Pt is wanting to let Dr. Carlota Raspberry know that his ultrasound needed to be rescheduled til Monday at 11 or [place] number [phone]

## 2016-07-02 NOTE — Patient Instructions (Signed)
[facility] Discharge Instructions for Patients Receiving Chemotherapy  Today you received the following chemotherapy agents pemetrexed and Cisplatin   To help prevent nausea and vomiting after your treatment, we encourage you to take your nausea medication:  Zofran 8mg  - Take 1 tablet by mouth 2 times daily as needed for nausea or vomiting. Start on Saturday.  Dexamethasone (Decadron) 4 mg tablet- Take 1 tab two times a day the day before Alimta chemo. Take 2 tabs the day after chemo, then take 2 tabs two times a day for 2 days.   Prochlorperazine (compazine) 10 mg tablet: Take 1 tablet by mouths ever 6 hours as needed for nausea and vomiting.   Medication to numb your Port: EMLA Cream Apply quarter size application to [place] site 1 hour prior to chemotherapy. Do NOT rub in. Cover area with plastic wrap.    If you develop nausea and vomiting that is not controlled by your nausea medication, call the clinic. If it is after clinic hours your family physician or the after hours number for the clinic or go to the Emergency Department.   BELOW ARE SYMPTOMS THAT SHOULD BE REPORTED IMMEDIATELY:  *FEVER GREATER THAN 100.5 F  *CHILLS WITH OR WITHOUT FEVER  NAUSEA AND VOMITING THAT IS NOT CONTROLLED WITH YOUR NAUSEA MEDICATION  *UNUSUAL SHORTNESS OF BREATH  *UNUSUAL BRUISING OR BLEEDING  TENDERNESS IN MOUTH AND THROAT WITH OR WITHOUT PRESENCE OF ULCERS  *URINARY PROBLEMS  *BOWEL PROBLEMS  UNUSUAL RASH Items with * indicate a potential emergency and should be followed up as soon as possible.  One of the nurses will contact you 24 hours after your treatment. Please let the nurse know about any problems that you may have experienced. Feel free to call the clinic you have any questions or concerns. The clinic phone number is [phone].   I have been informed and understand all the instructions given to me. I know to contact the clinic, my physician, or go to the  Emergency Department if any problems should occur. I do not have any questions at this time, but understand that I may call the clinic during office hours   should I have any questions or need assistance in obtaining follow up care.    __________________________________________  _____________  __________ Signature of Patient or Authorized Representative            Date                   Time    __________________________________________ Nurse's Signature

## 2016-07-03 ENCOUNTER — Telehealth: Payer: Self-pay | Admitting: *Deleted

## 2016-07-03 NOTE — Telephone Encounter (Signed)
Patient wants refill on his hydrocodone. Refill is not due until 07/09/16. He states that the current dose and frequency isn't working and he needs something more. He was just in the office yesterday and his fentanyl was increased from 27mcg to 11mcg.   Spoke to [facility] NP and she wants patient to see how the new dose of fentanyl work for his pain before she increased his hydrocodone.   He understood this plan. He will see how the patch works over the weekend and follow up with the office Monday.

## 2016-07-07 ENCOUNTER — Ambulatory Visit (HOSPITAL_COMMUNITY): Admission: RE | Admit: 2016-07-07 | Payer: Medicare Other | Source: Ambulatory Visit

## 2016-07-15 ENCOUNTER — Ambulatory Visit: Payer: Medicare Other | Admitting: Hematology & Oncology

## 2016-07-15 ENCOUNTER — Other Ambulatory Visit: Payer: Medicare Other

## 2016-07-15 ENCOUNTER — Telehealth: Payer: Self-pay | Admitting: *Deleted

## 2016-07-15 ENCOUNTER — Ambulatory Visit: Payer: Medicare Other

## 2016-07-15 NOTE — Progress Notes (Signed)
Histology and Location of Primary Cancer: Metastatic bladder cancer  Location(s) of Symptomatic Metastases: right hip  Past/Anticipated chemotherapy by medical oncology, if [id number] s/p cycle #4- Progression. CDDP/Alimta - cycle #1. Zometa 4 mg IV every 3 weeks.  Pain on a scale of 0-10 is: 4 in his right hip.   If Spine Met(s), symptoms, if any, include:  Bowel/Bladder retention or incontinence (please describe): no  Numbness or weakness in extremities (please describe): no  Current Decadron regimen, if applicable: takes before and after chemo  Ambulatory status? Walker? Wheelchair?: ambulatory  SAFETY ISSUES:  Prior radiation? no  Pacemaker/ICD? no  Possible current pregnancy? no  Is the patient on methotrexate? no  Current Complaints / other details:  Patient is using a 75 mcq fentanyl patch and takes usually 1 tablet of norco per day.  BP (!) 153/92 (BP Location: Right Arm, Patient Position: Sitting)   Pulse 90   Temp 98.3 F (36.8 C) (Oral)   Ht 6' 1.5" (1.867 m)   Wt 185 lb 6.4 oz (84.1 kg)   SpO2 100%   BMI 24.13 kg/m    Wt Readings from Last 3 Encounters:  07/16/16 185 lb 6.4 oz (84.1 kg)  07/02/16 183 lb (83 kg)  07/01/16 179 lb 6.4 oz (81.4 kg)

## 2016-07-15 NOTE — Telephone Encounter (Signed)
FYI "I have an appointment tomorrow and have no clue what this is in reference to.  Could you tell me what this is for." Dr. [name] has referred you to [facility] for radiation.  You will meet Dr. Clabe Seal nurse for nurse evaluation and history.  Followed by appointment with the doctor to establish the doctor patient care relationship.  "Thanks, I understand."

## 2016-07-16 ENCOUNTER — Encounter: Payer: Self-pay | Admitting: Radiation Oncology

## 2016-07-16 ENCOUNTER — Ambulatory Visit
Admission: RE | Admit: 2016-07-16 | Discharge: 2016-07-16 | Disposition: A | Discharge status: Home / Self Care | Payer: Medicare Other | Source: Ambulatory Visit | Attending: Radiation Oncology | Admitting: Radiation Oncology

## 2016-07-16 VITALS — BP 153/92 | HR 90 | Temp 98.3°F | Ht 73.5 in | Wt 185.4 lb

## 2016-07-16 DIAGNOSIS — R0602 Shortness of breath: Secondary | ICD-10-CM | POA: Diagnosis not present

## 2016-07-16 DIAGNOSIS — C775 Secondary and unspecified malignant neoplasm of intrapelvic lymph nodes: Secondary | ICD-10-CM | POA: Diagnosis not present

## 2016-07-16 DIAGNOSIS — C7801 Secondary malignant neoplasm of right lung: Secondary | ICD-10-CM | POA: Diagnosis not present

## 2016-07-16 DIAGNOSIS — Z87891 Personal history of nicotine dependence: Secondary | ICD-10-CM | POA: Diagnosis not present

## 2016-07-16 DIAGNOSIS — I10 Essential (primary) hypertension: Secondary | ICD-10-CM | POA: Diagnosis not present

## 2016-07-16 DIAGNOSIS — C679 Malignant neoplasm of bladder, unspecified: Secondary | ICD-10-CM

## 2016-07-16 DIAGNOSIS — R350 Frequency of micturition: Secondary | ICD-10-CM | POA: Diagnosis not present

## 2016-07-16 DIAGNOSIS — Z9221 Personal history of antineoplastic chemotherapy: Secondary | ICD-10-CM | POA: Diagnosis not present

## 2016-07-16 DIAGNOSIS — Z9889 Other specified postprocedural states: Secondary | ICD-10-CM | POA: Diagnosis not present

## 2016-07-16 DIAGNOSIS — N401 Enlarged prostate with lower urinary tract symptoms: Secondary | ICD-10-CM | POA: Diagnosis not present

## 2016-07-16 DIAGNOSIS — C7802 Secondary malignant neoplasm of left lung: Secondary | ICD-10-CM | POA: Diagnosis not present

## 2016-07-16 DIAGNOSIS — C7951 Secondary malignant neoplasm of bone: Secondary | ICD-10-CM | POA: Diagnosis not present

## 2016-07-16 DIAGNOSIS — Z79899 Other long term (current) drug therapy: Secondary | ICD-10-CM | POA: Diagnosis not present

## 2016-07-16 DIAGNOSIS — C779 Secondary and unspecified malignant neoplasm of lymph node, unspecified: Secondary | ICD-10-CM | POA: Diagnosis not present

## 2016-07-16 NOTE — Progress Notes (Signed)
Please see the Nurse Progress Note in the MD Initial Consult Encounter for this patient. 

## 2016-07-16 NOTE — Progress Notes (Signed)
Radiation Oncology         [phone] ________________________________  Name: Dustin Jordan MRN: [id number]  Date: 07/16/2016  DOB: [birth year]  Re-Eval Visit Note  CC: Leandrew Koyanagi, MD  [name], MD    ICD-9-CM ICD-10-CM   1. 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    Narrative:  Dustin Jordan is a 70 y.o. male who is presenting to clinic today with metastatic bladder cancer.  He was previously seen by me earlier this year in July. Before, the patient saw Dr. [name] during a consultation at [facility] on January 19, 2015 after being admitted from a partial bowel obstruction.  Subsequently, he had a CT scan which revealed extensive pelvic adenopathy.  He reported to Dr. [name] that he had bladder cancer removed in 2015 that was poorly differentiated.  After being discharged, the patient underwent a biopsy.    Pathology revealed metastatic bladder cancer.  The patient then had a PET scan performed, which revealed extensive metabolic disease in the pelvic lymph nodes and peritoneal implants.  There was right pelvic adenopathy with possible osseous erosion along the right sacroiliac joint. There was indeterminate activity in a porta hepatitis noted.  There is no thoracic or hepatic disease.     Since this initial consultation, he has been seen by Dr. [name] for chemotherapy of Carboplatin/Gezmar.  He also receives Zometa 4mg  delivered through an IV every 3 weeks.  After completing these cycles of chemotherapy, he began a new course on Tecentriq q every 21 days.  The patient reports that he has intermittent shortness of breath and has trouble sleeping at night.  The patient also still has right hip discomfort that radiates to the knee off and on.  Otherwise the patient is toleration chemotherapy.    He is here today to consider his options for radiotherapy with me.  The patient reports  that his energy level is fine, but does report some shortness of breath.                                ALLERGIES:  has No Known Allergies.  Meds: Current Outpatient Prescriptions  Medication Sig Dispense Refill  . dexamethasone (DECADRON) 4 MG tablet Take 1 tab two times a day the day before Alimta chemo. Take 2 tabs the day after chemo, then take 2 tabs two times a day for 2 days. 30 tablet 1  . fentaNYL (DURAGESIC - DOSED MCG/HR) 75 MCG/HR Place 1 patch (75 mcg total) onto the skin every 3 (three) days. 10 patch 0  . folic acid (FOLVITE) 1 MG tablet Take 1 tablet (1 mg total) by mouth daily. Start 5-7 days before Alimta chemotherapy. Continue until 21 days after Alimta completed. 100 tablet 3  . hydrochlorothiazide (MICROZIDE) 12.5 MG capsule TAKE 1 CAPSULE BY MOUTH EVERY MORNING 90 capsule 1  . HYDROcodone-acetaminophen (NORCO) 10-325 MG tablet Take 1 tablet by mouth every 6 (six) hours as needed for moderate pain or severe pain. 120 tablet 0  . lidocaine-prilocaine (EMLA) cream Apply 1 application topically as needed. 30 g 12  . lidocaine-prilocaine (EMLA) cream Apply to affected area once 30 g 3  . naproxen (NAPROSYN) 500 MG tablet Take 1 tablet (500 mg total) by mouth 2 (two) times daily with a meal. 60 tablet 2  . ondansetron (ZOFRAN) 8  MG tablet Take 1 tablet (8 mg total) by mouth 2 (two) times daily as needed (Nausea or vomiting). Start if needed on the third day after chemotherapy. 30 tablet 1  . polyethylene glycol powder (GLYCOLAX/MIRALAX) powder Take 17 g by mouth 2 (two) times daily as needed. 500 g 1  . prochlorperazine (COMPAZINE) 10 MG tablet Take 1 tablet (10 mg total) by mouth every 6 (six) hours as needed (Nausea or vomiting). 30 tablet 1  . tamsulosin (FLOMAX) 0.4 MG CAPS capsule TAKE 2 CAPSULES BY MOUTH EVERY DAY AS DIRECTED 180 capsule 0  . fentaNYL (DURAGESIC - DOSED MCG/HR) 25 MCG/HR patch     . fentaNYL (DURAGESIC - DOSED MCG/HR) 50 MCG/HR     . HYDROcodone-ibuprofen  (VICOPROFEN) 7.5-200 MG tablet      No current facility-administered medications for this encounter.     Physical Findings: The patient is in no acute distress. Patient is alert and oriented.  height is 6' 1.5" (1.867 m) and weight is 185 lb 6.4 oz (84.1 kg). His oral temperature is 98.3 F (36.8 C). His blood pressure is 153/92 (abnormal) and his pulse is 90. His oxygen saturation is 100%. .  No significant changes.  Lungs are clear to auscultation bilaterally. Heart has regular rate and rhythm. No palpable cervical, supraclavicular, or axillary adenopathy. Abdomen soft, non-tender, normal bowel sounds.   Lab Findings: Lab Results  Component Value Date   WBC 4.8 07/02/2016   HGB 10.3 (L) 07/02/2016   HCT 32.2 (L) 07/02/2016   MCV 80 (L) 07/02/2016   PLT 203 07/02/2016    Radiographic Findings: Dg Chest 2 View  Result Date: 07/01/2016 CLINICAL DATA:  Right-sided chest pain for 3 days, shortness of breath, history of metastatic bladder carcinoma EXAM: CHEST  2 VIEW COMPARISON:  PET-CT of 06/04/2016 and chest x-ray of 05/27/2016 FINDINGS: There are still pulmonary nodules bilaterally. These nodules appear to have decreased in size and number. The dominant nodule is in the periphery of the left mid lung base mid now measuring approximately 21 mm in diameter compared to 26 mm previously. No definite new pulmonary nodule is seen. The lungs remain hyperaerated. No pleural effusion is noted. The heart is mildly enlarged and stable. Right-sided Port-A-Cath is present with the tip in the lower SVC. IMPRESSION: 1. Decrease in size and number of pulmonary metastases. No new abnormality. 2. No change in hyper aeration. 3. Port-A-Cath tip is seen to the lower SVC. Electronically Signed   By: Ivar Drape M.D.   On: 07/01/2016 13:46   Ct Angio Chest W/cm &/or Wo Cm  Result Date: 07/01/2016 CLINICAL DATA:  Right-sided chest pain,, history of bladder cancer. EXAM: CT ANGIOGRAPHY CHEST WITH CONTRAST  TECHNIQUE: Multidetector CT imaging of the chest was performed using the standard protocol during bolus administration of intravenous contrast. Multiplanar CT image reconstructions and MIPs were obtained to evaluate the vascular anatomy. CONTRAST:  80 mL of Isovue 370 COMPARISON:  Chest radiograph from 05/27/2016, 07/01/2016 and PET-CT from 09/05/2015 FINDINGS: Cardiovascular: Top normal size cardiac chambers. No pericardial effusion. No thoracic aortic aneurysm or dissection. No pulmonary embolus. Mediastinum/Nodes: No mediastinal nor hilar adenopathy. Lungs/Pleura: Too numerous to count pulmonary nodules the largest in the lingula measuring 21 x 18 mm for mean diameter of approximately 20 mm. Upper Abdomen: No acute abnormality. Musculoskeletal: Small focus of lytic abnormality at T6 anteriorly as well as adjacent to the right pedicle series 4, image 40. On the sagittal reformatted images were other areas of patchy  subtle hypodensity from T4 through T7. Schmorl's node inferior endplate of [id number]. Review of the MIP images confirms the above findings. IMPRESSION: No acute pulmonary embolus. Bilateral pulmonary nodules consistent with metastasis. Patchy subtle lytic lucencies of the mid thoracic spine most evident at T6 consistent with metastatic disease. Electronically Signed   By: Ashley Royalty M.D.   On: 07/01/2016 17:58    Impression:  Mr. Sherre Poot is a 70 year old African American male bladder cancer with metastatic lymph and osseous mets. Recent PET scan shows new diffuse bilateral pulmonary metastasis as well as significant progression of diffuse hypermetabolic bony metastasis. Patient also has increased hypermetabolic lymphadenopathy in the right pelvis. Patient is symptomatic from his disease along the right pelvic bone and associated lymphadenopathy adjacent to this bone area. Patient would be a good candidate for palliative radiation therapy to this region.  Plan:  Today, I talked to the patient  about the  findings and work-up thus far.  We discussed the natural history of metastatic disease and general treatment, highlighting the role of radiotherapy in the management.  We discussed the available radiation techniques, and focused on the details of logistics and delivery.  We reviewed the anticipated acute and late sequelae associated with radiation in this setting.  The patient was encouraged to ask questions that I answered to the best of my ability.  I filled out a patient counseling form during our discussion including treatment diagrams.  We retained a copy for our records.  The patient would like to proceed with radiation and will be scheduled for CT simulation. His simulation is scheduled for October 24th at 9:00 a.m. anticipate 3 weeks of radiation therapy.    ____________________________________   This document serves as a record of services personally performed by Gery Pray, MD. It was created on his behalf by Truddie Hidden, a trained medical scribe. The creation of this record is based on the scribe's personal observations and the provider's statements to them. This document has been checked and approved by the attending provider.

## 2016-07-22 ENCOUNTER — Encounter (HOSPITAL_COMMUNITY): Payer: Medicare Other

## 2016-07-22 ENCOUNTER — Telehealth: Payer: Self-pay

## 2016-07-22 ENCOUNTER — Ambulatory Visit
Admission: RE | Admit: 2016-07-22 | Discharge: 2016-07-22 | Disposition: A | Discharge status: Home / Self Care | Payer: Medicare Other | Source: Ambulatory Visit | Attending: Radiation Oncology | Admitting: Radiation Oncology

## 2016-07-22 ENCOUNTER — Ambulatory Visit (HOSPITAL_COMMUNITY)
Admission: RE | Admit: 2016-07-22 | Discharge: 2016-07-22 | Disposition: A | Discharge status: Home / Self Care | Payer: Medicare Other | Source: Ambulatory Visit | Attending: Family Medicine | Admitting: Family Medicine

## 2016-07-22 DIAGNOSIS — Z789 Other specified health status: Secondary | ICD-10-CM | POA: Insufficient documentation

## 2016-07-22 DIAGNOSIS — C679 Malignant neoplasm of bladder, unspecified: Secondary | ICD-10-CM | POA: Diagnosis not present

## 2016-07-22 DIAGNOSIS — M25471 Effusion, right ankle: Secondary | ICD-10-CM | POA: Diagnosis not present

## 2016-07-22 DIAGNOSIS — Z510 Encounter for antineoplastic radiation therapy: Secondary | ICD-10-CM | POA: Insufficient documentation

## 2016-07-22 DIAGNOSIS — C775 Secondary and unspecified malignant neoplasm of intrapelvic lymph nodes: Secondary | ICD-10-CM | POA: Insufficient documentation

## 2016-07-22 DIAGNOSIS — C7951 Secondary malignant neoplasm of bone: Secondary | ICD-10-CM | POA: Diagnosis not present

## 2016-07-22 NOTE — Telephone Encounter (Signed)
Per Dr. Carlota Raspberry, called patient to notify him of negative results to venous study.  Advised patient to RTC to see Dr. Carlota Raspberry if sx worsen.  He verbalized understanding.

## 2016-07-22 NOTE — Progress Notes (Signed)
VASCULAR LAB PRELIMINARY  PRELIMINARY  PRELIMINARY  PRELIMINARY  Right lower extremity venous duplex completed.    Preliminary report:  Right:  No evidence of DVT, superficial thrombosis, or Baker's cyst.  Peace Noyes, RVS 07/22/2016, 4:11 PM

## 2016-07-22 NOTE — Progress Notes (Signed)
  Radiation Oncology         [phone] ________________________________  Name: Dustin Jordan MRN: [id number]  Date: 07/22/2016  DOB: 1945/12/31  SIMULATION AND TREATMENT PLANNING NOTE    ICD-9-CM ICD-10-CM   1. 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   NARRATIVE:  The patient was brought to the [place].  Identity was confirmed.  All relevant records and images related to the planned course of therapy were reviewed.  The patient freely provided informed written consent to proceed with treatment after reviewing the details related to the planned course of therapy. The consent form was witnessed and verified by the simulation staff.  Then, the patient was set-up in a stable reproducible  supine position for radiation therapy.  CT images were obtained.  Surface markings were placed.  The CT images were loaded into the planning software.  Then the target and avoidance structures were contoured.  Treatment planning then occurred.  The radiation prescription was entered and confirmed.  Then, I designed and supervised the construction of a total of 4 medically necessary complex treatment devices.  I have requested : 3D Simulation  I have requested a DVH of the following structures: GTV, PTV, bladder, rectum, bowel, fem head/neck.  I have ordered:dose calc.  PLAN:  The patient will receive 35 Gy in 14 fractions.  -----------------------------------  Blair Promise, PhD, MD   This document serves as a record of services personally performed by Gery Pray, MD. It was created on his behalf by Truddie Hidden, a trained medical scribe. The creation of this record is based on the scribe's personal observations and the provider's statements to them. This document has been checked and approved by the attending provider.

## 2016-07-23 ENCOUNTER — Ambulatory Visit (HOSPITAL_BASED_OUTPATIENT_CLINIC_OR_DEPARTMENT_OTHER): Payer: Medicare Other | Admitting: Family

## 2016-07-23 ENCOUNTER — Ambulatory Visit (HOSPITAL_BASED_OUTPATIENT_CLINIC_OR_DEPARTMENT_OTHER): Payer: Medicare Other

## 2016-07-23 ENCOUNTER — Encounter: Payer: Self-pay | Admitting: Family

## 2016-07-23 ENCOUNTER — Other Ambulatory Visit (HOSPITAL_BASED_OUTPATIENT_CLINIC_OR_DEPARTMENT_OTHER): Payer: Medicare Other

## 2016-07-23 VITALS — BP 139/78 | HR 81 | Temp 98.1°F | Resp 16 | Ht 73.5 in | Wt 187.0 lb

## 2016-07-23 DIAGNOSIS — C775 Secondary and unspecified malignant neoplasm of intrapelvic lymph nodes: Principal | ICD-10-CM

## 2016-07-23 DIAGNOSIS — Z5111 Encounter for antineoplastic chemotherapy: Secondary | ICD-10-CM | POA: Diagnosis not present

## 2016-07-23 DIAGNOSIS — Z79899 Other long term (current) drug therapy: Secondary | ICD-10-CM | POA: Diagnosis not present

## 2016-07-23 DIAGNOSIS — C679 Malignant neoplasm of bladder, unspecified: Secondary | ICD-10-CM

## 2016-07-23 DIAGNOSIS — C779 Secondary and unspecified malignant neoplasm of lymph node, unspecified: Secondary | ICD-10-CM | POA: Diagnosis not present

## 2016-07-23 DIAGNOSIS — C7951 Secondary malignant neoplasm of bone: Secondary | ICD-10-CM

## 2016-07-23 LAB — CBC WITH DIFFERENTIAL (CANCER CENTER ONLY)
BASO#: 0 10*3/uL (ref 0.0–0.2)
BASO%: 0.2 % (ref 0.0–2.0)
EOS%: 0 % (ref 0.0–7.0)
Eosinophils Absolute: 0 10*3/uL (ref 0.0–0.5)
HCT: 29.8 % — ABNORMAL LOW (ref 38.7–49.9)
HGB: 9.8 g/dL — ABNORMAL LOW (ref 13.0–17.1)
LYMPH#: 1.1 10*3/uL (ref 0.9–3.3)
LYMPH%: 18.7 % (ref 14.0–48.0)
MCH: 27.1 pg — ABNORMAL LOW (ref 28.0–33.4)
MCHC: 32.9 g/dL (ref 32.0–35.9)
MCV: 82 fL (ref 82–98)
MONO#: 0.9 10*3/uL (ref 0.1–0.9)
MONO%: 14.9 % — AB (ref 0.0–13.0)
NEUT#: 4 10*3/uL (ref 1.5–6.5)
NEUT%: 66.2 % (ref 40.0–80.0)
PLATELETS: 200 10*3/uL (ref 145–400)
RBC: 3.62 10*6/uL — ABNORMAL LOW (ref 4.20–5.70)
RDW: 26.4 % — AB (ref 11.1–15.7)
WBC: 6 10*3/uL (ref 4.0–10.0)

## 2016-07-23 LAB — LACTATE DEHYDROGENASE: LDH: 392 U/L — AB (ref 125–245)

## 2016-07-23 LAB — CMP (CANCER CENTER ONLY)
ALK PHOS: 65 U/L (ref 26–84)
ALT: 19 U/L (ref 10–47)
AST: 36 U/L (ref 11–38)
Albumin: 3.1 g/dL — ABNORMAL LOW (ref 3.3–5.5)
BUN: 30 mg/dL — AB (ref 7–22)
CHLORIDE: 98 meq/L (ref 98–108)
CO2: 27 mEq/L (ref 18–33)
CREATININE: 1.2 mg/dL (ref 0.6–1.2)
Calcium: 9.2 mg/dL (ref 8.0–10.3)
GLUCOSE: 116 mg/dL (ref 73–118)
POTASSIUM: 4.2 meq/L (ref 3.3–4.7)
SODIUM: 136 meq/L (ref 128–145)
Total Bilirubin: 0.6 mg/dl (ref 0.20–1.60)
Total Protein: 7.4 g/dL (ref 6.4–8.1)

## 2016-07-23 LAB — TSH: TSH: 0.656 m[IU]/L (ref 0.320–4.118)

## 2016-07-23 LAB — TECHNOLOGIST REVIEW CHCC SATELLITE

## 2016-07-23 IMAGING — CT CT PELVIS W/ CM
2 of 4 series · 16 of 46 positions shown, 18 images · IV contrast (APPLIED)
Comparison: PET of 02/04/2016.  Most recent CT of 01/19/2015.

CLINICAL DATA: Bladder cancer with nodal metastasis. Bone
metastasis. Right hip pain.

EXAM:
CT PELVIS WITH CONTRAST
TECHNIQUE: Multidetector CT imaging of the pelvis was performed using the
standard protocol following the bolus administration of intravenous
contrast.
CONTRAST:  100mL [id number] IOPAMIDOL (YPT2J0-XAA) INJECTION 61%

[Series 2: axial st · axial · 0.88mm/px · z∈[-376,-96]mm · 13 of 64 slices shown, 15 images]
[im 4/64  soft-tissue]
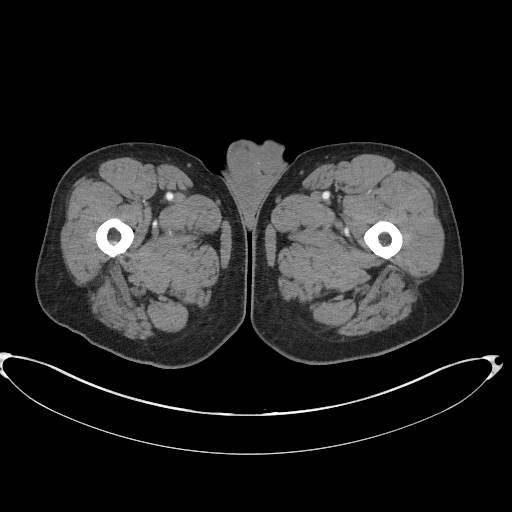
[im 4/64  bone]
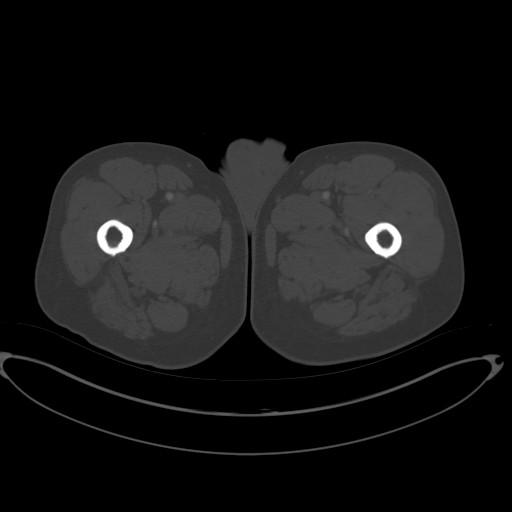
[im 10/64  soft-tissue]
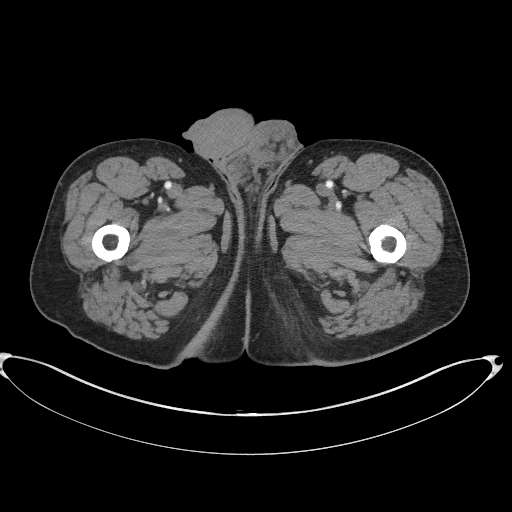
[im 14/64  soft-tissue]
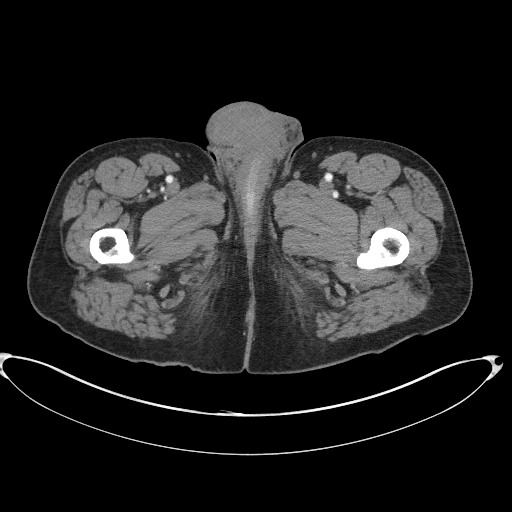
[im 17/64  soft-tissue]
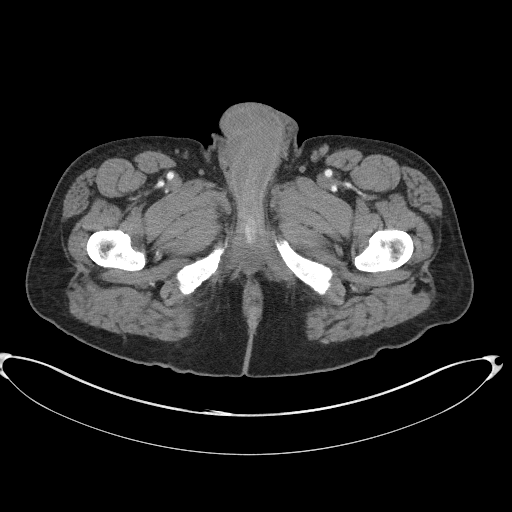
[im 24/64  soft-tissue]
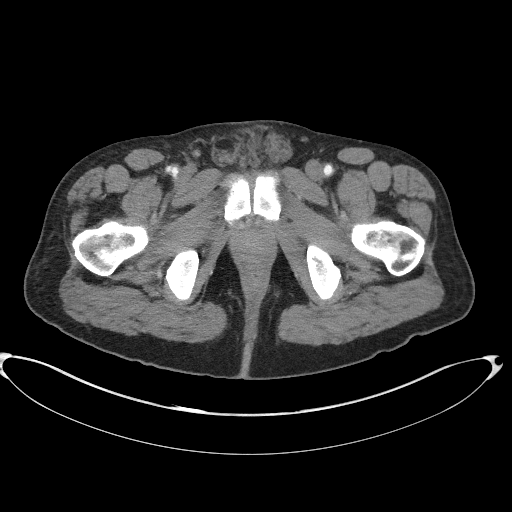
[im 27/64  soft-tissue]
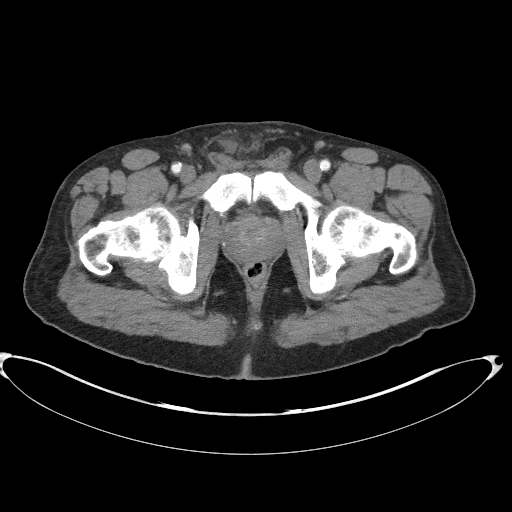
[im 34/64  soft-tissue]
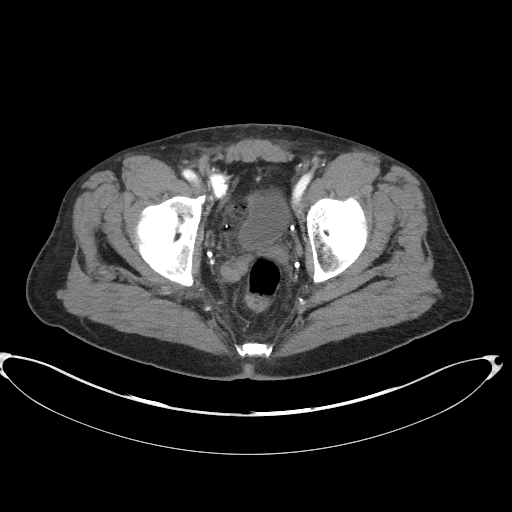
[im 37/64  soft-tissue]
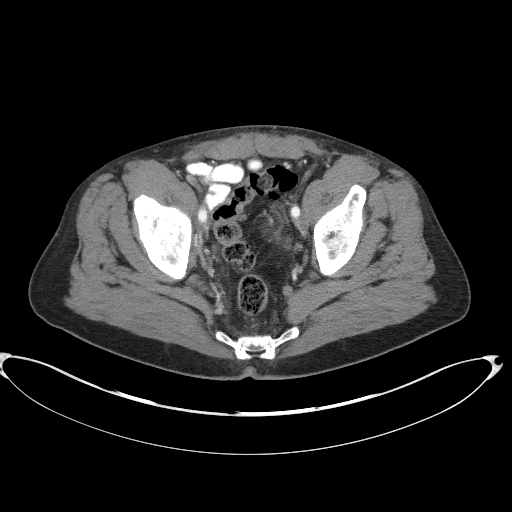
[im 40/64  soft-tissue]
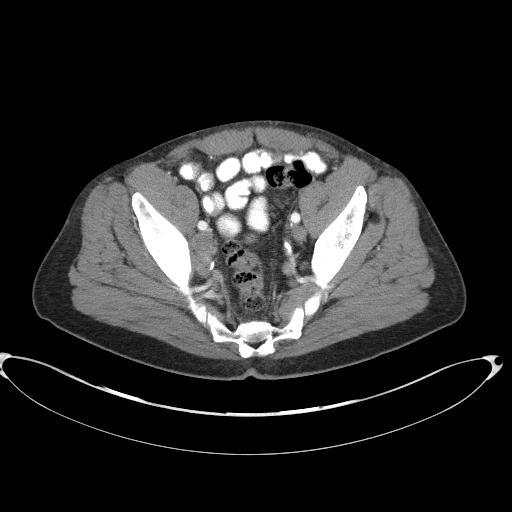
[im 40/64  bone]
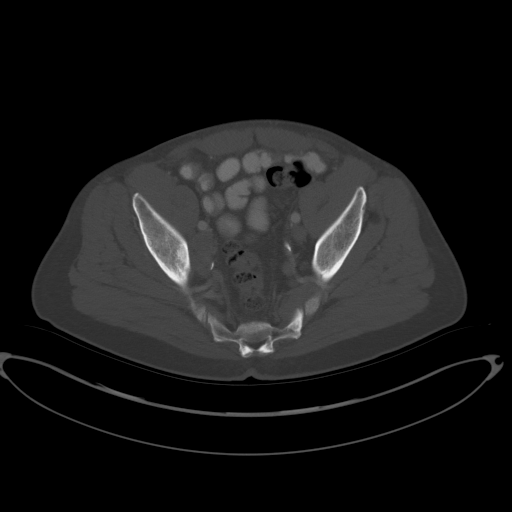
[im 47/64  soft-tissue]
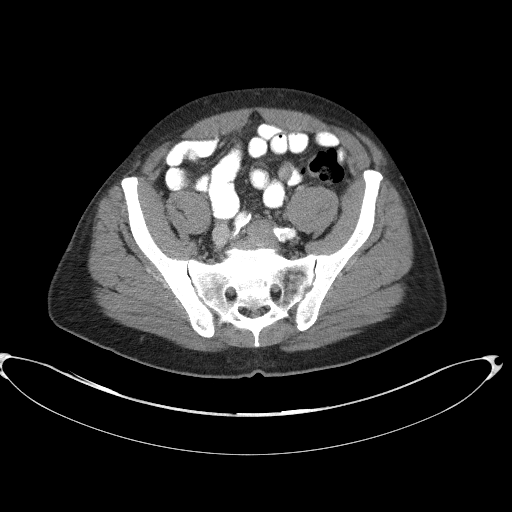
[im 50/64  soft-tissue]
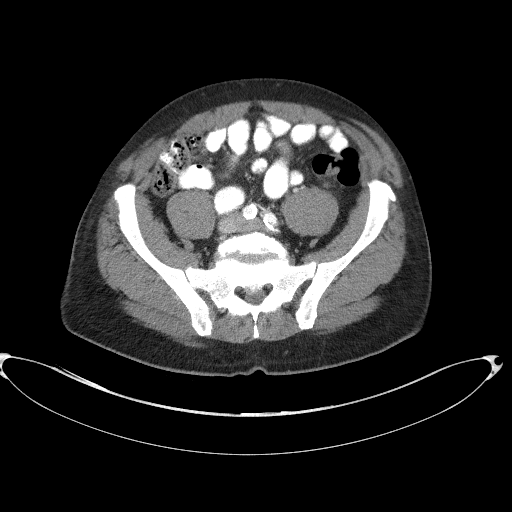
[im 54/64  soft-tissue]
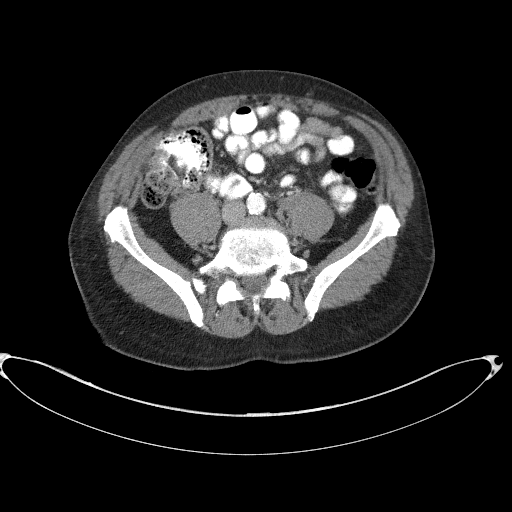
[im 60/64  soft-tissue]
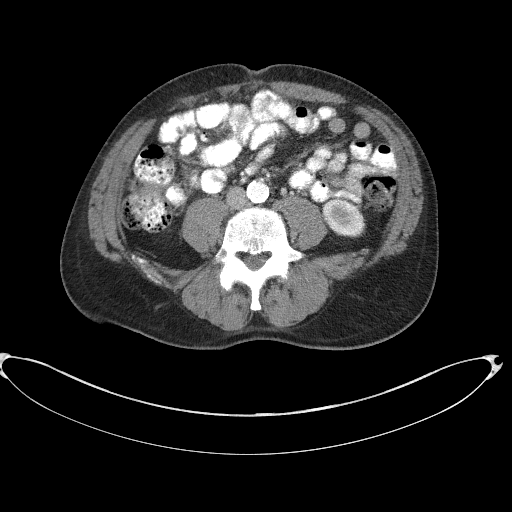

[Series 4: coronal st · coronal · 0.60mm/px · 3 of 104 slices shown]
[im 35/104  soft-tissue]
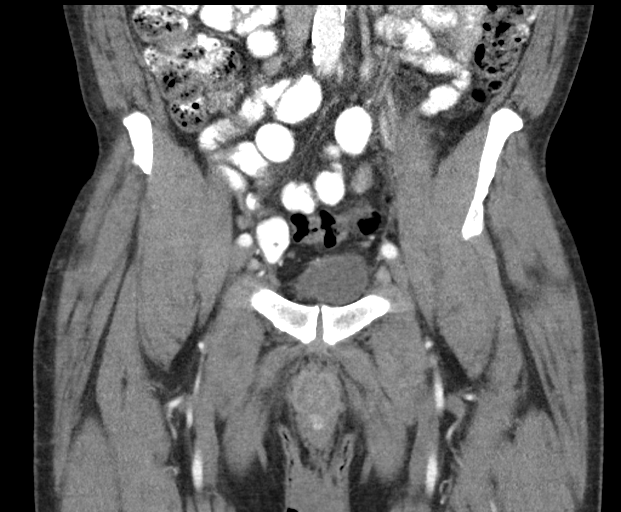
[im 46/104  soft-tissue]
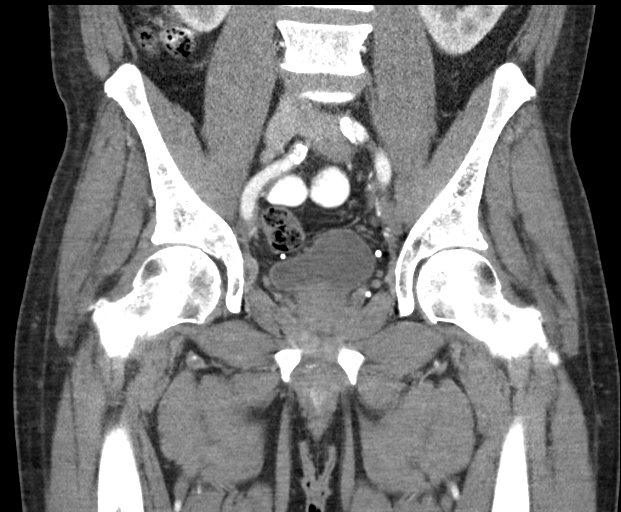
[im 58/104  soft-tissue]
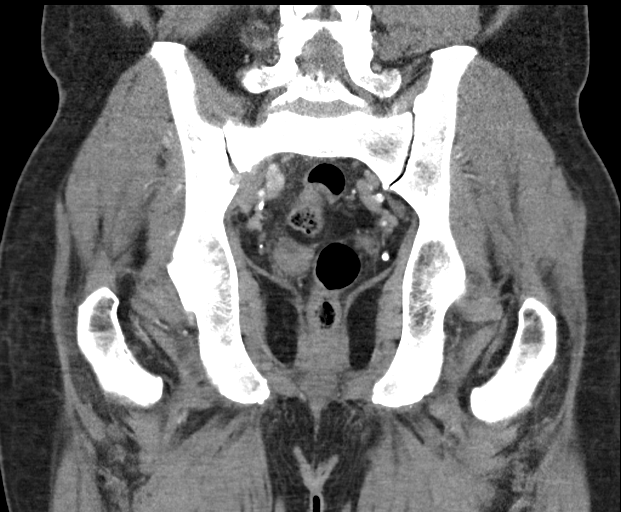

[16 of 46 positions shown; findings below may reference images not displayed]

FINDINGS: Urinary Tract: No distal hydroureter.  Normal urinary bladder.

Bowel: normal imaged colon, terminal ileum, and appendix. Normal
pelvic small bowel.

Vascular/Lymphatic: Distal aortic and pelvic atherosclerosis.

Right external iliac node is necrotic and measures 1.8 cm on image
25/series 2. This measured 1.6 cm on the prior PET.

left external iliac node measures 9 mm on image 34/series 2 versus 8
mm previously.

Reproductive: Mild prostatomegaly.

Other: No significant free fluid. No pelvic peritoneal metastasis
identified.

Musculoskeletal: Mild bilateral hip osteoarthritis. Vague right
iliac sclerosis on image 20/series 2 is similar to on the prior PET.
IMPRESSION: 1. Slight increase in pelvic adenopathy.
2. Similar vague sclerosis involving the right iliac bone. Although
this could be degenerative, presence of hypermetabolism on
02/04/2016 suggests an underlying osseous metastasis. Otherwise,
osseous metastasis from prior PET are not readily apparent at CT.
3.  Aortic atherosclerosis.

## 2016-07-23 MED ORDER — PALONOSETRON HCL INJECTION 0.25 MG/5ML
0.2500 mg | Freq: Once | INTRAVENOUS | Status: AC
  Administered 2016-07-23: 0.25 mg via INTRAVENOUS

## 2016-07-23 MED ORDER — POTASSIUM CHLORIDE 2 MEQ/ML IV SOLN
Freq: Once | INTRAVENOUS | Status: AC
  Administered 2016-07-23: 11:00:00 via INTRAVENOUS
  Filled 2016-07-23: qty 10

## 2016-07-23 MED ORDER — FOSAPREPITANT DIMEGLUMINE INJECTION 150 MG
Freq: Once | INTRAVENOUS | Status: AC
  Administered 2016-07-23: 13:00:00 via INTRAVENOUS
  Filled 2016-07-23: qty 5

## 2016-07-23 MED ORDER — HEPARIN SOD (PORK) LOCK FLUSH 100 UNIT/ML IV SOLN
500.0000 [IU] | Freq: Once | INTRAVENOUS | Status: AC | PRN
  Administered 2016-07-23: 500 [IU]
  Filled 2016-07-23: qty 5

## 2016-07-23 MED ORDER — PALONOSETRON HCL INJECTION 0.25 MG/5ML
INTRAVENOUS | Status: AC
  Filled 2016-07-23: qty 5

## 2016-07-23 MED ORDER — SODIUM CHLORIDE 0.9 % IV SOLN
60.0000 mg/m2 | Freq: Once | INTRAVENOUS | Status: AC
  Administered 2016-07-23: 123 mg via INTRAVENOUS
  Filled 2016-07-23: qty 100

## 2016-07-23 MED ORDER — SODIUM CHLORIDE 0.9 % IV SOLN
800.0000 mg | Freq: Once | INTRAVENOUS | Status: AC
  Administered 2016-07-23: 800 mg via INTRAVENOUS
  Filled 2016-07-23: qty 20

## 2016-07-23 MED ORDER — SODIUM CHLORIDE 0.9% FLUSH
10.0000 mL | INTRAVENOUS | Status: DC | PRN
  Administered 2016-07-23: 10 mL
  Filled 2016-07-23: qty 10

## 2016-07-23 NOTE — Progress Notes (Signed)
350 cc Urine output noted prior to Cisplatin.  Total urine output 500 cc/day.

## 2016-07-23 NOTE — Progress Notes (Signed)
Hematology and Oncology Follow Up Visit  Dustin Jordan [id number] [birth year] 70 y.o. 07/23/2016   Principle Diagnosis:  Metastatic bladder cancer - bone and lymph node metastasis - March  Current Therapy:   Tecentriq s/p 4 cycles - had progression CDDP/Alimta s/p cycle 2  Zometa 4 mg IV every 3 weeks    Interim History:  Dustin Jordan is here today for a follow-up. He is staying active and at this time his right hip pain is managed effectively with the Duragesic patch and Norco. He has agreed to have palliative radiation to the right hip and will begin this on 11/7 with Dr. Sondra Come.  He has had some mild swelling in the right foot that comes and goes. This improves with elevating his leg and is not painful. He states that he had an [place] yesterday which was negative for DVT.  He is still having some SOB with exertion and will take time to rest as needed.  Today will be cycle 3 of treatment with CDDP/Alimta so we will plan to repeat a PET scan to assess his response in the next 2-3 weeks.  No fever, chills, n/v, cough, rash, dizziness, chest pain, palpitations, abdominal pain or changes in bowel or bladder habits.  No lymphadenopathy found on assessment. No episodes of bleeding or bruising.  He has a good appetite and has found several new dishes that he enjoys. He is staying well hydrated. His weight is up 2 more lbs since his last visit.    Medications:    Medication List       Accurate as of 07/23/16  9:38 AM. Always use your most recent med list.          dexamethasone 4 MG tablet Commonly known as:  DECADRON Take 1 tab two times a day the day before Alimta chemo. Take 2 tabs the day after chemo, then take 2 tabs two times a day for 2 days.   fentaNYL 75 MCG/HR Commonly known as:  DURAGESIC - dosed mcg/hr Place 1 patch (75 mcg total) onto the skin every 3 (three) days.   folic acid 1 MG tablet Commonly known as:  FOLVITE Take 1 tablet (1 mg total) by mouth daily. Start 5-7  days before Alimta chemotherapy. Continue until 21 days after Alimta completed.   hydrochlorothiazide 12.5 MG capsule Commonly known as:  MICROZIDE TAKE 1 CAPSULE BY MOUTH EVERY MORNING   HYDROcodone-acetaminophen 10-325 MG tablet Commonly known as:  NORCO Take 1 tablet by mouth every 6 (six) hours as needed for moderate pain or severe pain.   HYDROcodone-ibuprofen 7.5-200 MG tablet Commonly known as:  VICOPROFEN   lidocaine-prilocaine cream Commonly known as:  EMLA Apply to affected area once   naproxen 500 MG tablet Commonly known as:  NAPROSYN Take 1 tablet (500 mg total) by mouth 2 (two) times daily with a meal.   ondansetron 8 MG tablet Commonly known as:  ZOFRAN Take 1 tablet (8 mg total) by mouth 2 (two) times daily as needed (Nausea or vomiting). Start if needed on the third day after chemotherapy.   polyethylene glycol powder powder Commonly known as:  GLYCOLAX/MIRALAX Take 17 g by mouth 2 (two) times daily as needed.   prochlorperazine 10 MG tablet Commonly known as:  COMPAZINE Take 1 tablet (10 mg total) by mouth every 6 (six) hours as needed (Nausea or vomiting).   tamsulosin 0.4 MG Caps capsule Commonly known as:  FLOMAX TAKE 2 CAPSULES BY MOUTH EVERY DAY AS DIRECTED  Allergies: No Known Allergies  Past Medical History, Surgical history, Social history, and Family History were reviewed and updated.  Review of Systems: All other 10 point review of systems is negative.   Physical Exam:  height is 6' 1.5" (1.867 m) and weight is 187 lb (84.8 kg). His oral temperature is 98.1 F (36.7 C). His blood pressure is 139/78 and his pulse is 81. His respiration is 16.   Wt Readings from Last 3 Encounters:  07/23/16 187 lb (84.8 kg)  07/16/16 185 lb 6.4 oz (84.1 kg)  07/02/16 183 lb (83 kg)    Ocular: Sclerae unicteric, pupils equal, round and reactive to light Ear-nose-throat: Oropharynx clear, dentition fair Lymphatic: No cervical supraclavicular or  axillary adenopathy Lungs no rales or rhonchi, good excursion bilaterally Heart regular rate and rhythm, no murmur appreciated Abd soft, nontender, positive bowel sounds, no liver or spleen tip palpated on exam, no fluid wave MSK no focal spinal tenderness, no joint edema, has pain in the right hip that radiates to his knee Neuro: non-focal, well-oriented, appropriate affect Breasts: Deferred  Lab Results  Component Value Date   WBC 4.8 07/02/2016   HGB 10.3 (L) 07/02/2016   HCT 32.2 (L) 07/02/2016   MCV 80 (L) 07/02/2016   PLT 203 07/02/2016   Lab Results  Component Value Date   FERRITIN 122 01/28/2016   IRON 51 01/28/2016   TIBC 348 01/28/2016   UIBC 297 01/28/2016   IRONPCTSAT 15 (L) 01/28/2016   Lab Results  Component Value Date   RBC 4.05 (L) 07/02/2016   No results found for: KPAFRELGTCHN, LAMBDASER, KAPLAMBRATIO No results found for: IGGSERUM, IGA, IGMSERUM No results found for: Odetta Pink, SPEI   Chemistry      Component Value Date/Time   NA 133 07/02/2016 0835   K 3.2 (L) 07/02/2016 0835   CL 97 (L) 07/02/2016 0835   CO2 29 07/02/2016 0835   BUN 16 07/02/2016 0835   CREATININE 1.4 (H) 07/02/2016 0835      Component Value Date/Time   CALCIUM 9.7 07/02/2016 0835   ALKPHOS 52 07/02/2016 0835   AST 32 07/02/2016 0835   ALT 23 07/02/2016 0835   BILITOT 0.90 07/02/2016 0835     Impression and Plan: Dustin Jordan is very pleasant 70 yo African American male with poorly differentiated metastatic bladder cancer with skeletal metastasis. He is doing well tolerating treatment and has a good quality of life at this time. His pain is managed effectively and he starts palliative radiation to the right hip on 11/7 for 14 fractions.  CBC and CMP are stable at this time.  We will proceed with cycle 3 of CDDP/Alimta today as planned and will repeat a PET scan to evaluate response in 2-3 weeks.  He has his current  treatment and appointment schedule and will return on 11/16 for lab work, MD visit and to discuss scan results.  He will contact us with any questions or concerns. We Jordan certainly see him sooner if need be.   Eliezer Bottom, NP 10/25/20179:38 AM

## 2016-07-23 NOTE — Patient Instructions (Signed)
Cisplatin injection What is this medicine? CISPLATIN (SIS pla tin) is a chemotherapy drug. It targets fast dividing cells, like cancer cells, and causes these cells to die. This medicine is used to treat many types of cancer like bladder, ovarian, and testicular cancers. This medicine may be used for other purposes; ask your health care provider or pharmacist if you have questions. What should I tell my health care provider before I take this medicine? They need to know if you have any of these conditions: -blood disorders -hearing problems -kidney disease -recent or ongoing radiation therapy -an unusual or allergic reaction to cisplatin, carboplatin, other chemotherapy, other medicines, foods, dyes, or preservatives -pregnant or trying to get pregnant -breast-feeding How should I use this medicine? This drug is given as an infusion into a vein. It is administered in a hospital or clinic by a specially trained health care professional. Talk to your pediatrician regarding the use of this medicine in children. Special care may be needed. Overdosage: If you think you have taken too much of this medicine contact a poison control center or emergency room at once. NOTE: This medicine is only for you. Do not share this medicine with others. What if I miss a dose? It is important not to miss a dose. Call your doctor or health care professional if you are unable to keep an appointment. What may interact with this medicine? -dofetilide -foscarnet -medicines for seizures -medicines to increase blood counts like filgrastim, pegfilgrastim, sargramostim -probenecid -pyridoxine used with altretamine -rituximab -some antibiotics like amikacin, gentamicin, neomycin, polymyxin B, streptomycin, tobramycin -sulfinpyrazone -vaccines -zalcitabine Talk to your doctor or health care professional before taking any of these medicines: -acetaminophen -aspirin -ibuprofen -ketoprofen -naproxen This list may  not describe all possible interactions. Give your health care provider a list of all the medicines, herbs, non-prescription drugs, or dietary supplements you use. Also tell them if you smoke, drink alcohol, or use illegal drugs. Some items may interact with your medicine. What should I watch for while using this medicine? Your condition will be monitored carefully while you are receiving this medicine. You will need important blood work done while you are taking this medicine. This drug may make you feel generally unwell. This is not uncommon, as chemotherapy can affect healthy cells as well as cancer cells. Report any side effects. Continue your course of treatment even though you feel ill unless your doctor tells you to stop. In some cases, you may be given additional medicines to help with side effects. Follow all directions for their use. Call your doctor or health care professional for advice if you get a fever, chills or sore throat, or other symptoms of a cold or flu. Do not treat yourself. This drug decreases your body's ability to fight infections. Try to avoid being around people who are sick. This medicine may increase your risk to bruise or bleed. Call your doctor or health care professional if you notice any unusual bleeding. Be careful brushing and flossing your teeth or using a toothpick because you may get an infection or bleed more easily. If you have any dental work done, tell your dentist you are receiving this medicine. Avoid taking products that contain aspirin, acetaminophen, ibuprofen, naproxen, or ketoprofen unless instructed by your doctor. These medicines may hide a fever. Do not become pregnant while taking this medicine. Women should inform their doctor if they wish to become pregnant or think they might be pregnant. There is a potential for serious side effects to   an unborn child. Talk to your health care professional or pharmacist for more information. Do not breast-feed an  infant while taking this medicine. Drink fluids as directed while you are taking this medicine. This will help protect your kidneys. Call your doctor or health care professional if you get diarrhea. Do not treat yourself. What side effects may I notice from receiving this medicine? Side effects that you should report to your doctor or health care professional as soon as possible: -allergic reactions like skin rash, itching or hives, swelling of the face, lips, or tongue -signs of infection - fever or chills, cough, sore throat, pain or difficulty passing urine -signs of decreased platelets or bleeding - bruising, pinpoint red spots on the skin, black, tarry stools, nosebleeds -signs of decreased red blood cells - unusually weak or tired, fainting spells, lightheadedness -breathing problems -changes in hearing -gout pain -low blood counts - This drug may decrease the number of white blood cells, red blood cells and platelets. You may be at increased risk for infections and bleeding. -nausea and vomiting -pain, swelling, redness or irritation at the injection site -pain, tingling, numbness in the hands or feet -problems with balance, movement -trouble passing urine or change in the amount of urine Side effects that usually do not require medical attention (report to your doctor or health care professional if they continue or are bothersome): -changes in vision -loss of appetite -metallic taste in the mouth or changes in taste This list may not describe all possible side effects. Call your doctor for medical advice about side effects. You may report side effects to FDA at 1-800-FDA-1088. Where should I keep my medicine? This drug is given in a hospital or clinic and will not be stored at home. NOTE: This sheet is a summary. It may not cover all possible information. If you have questions about this medicine, talk to your doctor, pharmacist, or health care provider.    2016, Elsevier/Gold  Standard. (2007-12-21 14:40:54)  

## 2016-07-24 ENCOUNTER — Other Ambulatory Visit: Payer: Self-pay | Admitting: Family

## 2016-07-24 DIAGNOSIS — M25551 Pain in right hip: Secondary | ICD-10-CM

## 2016-07-24 DIAGNOSIS — C775 Secondary and unspecified malignant neoplasm of intrapelvic lymph nodes: Principal | ICD-10-CM

## 2016-07-24 DIAGNOSIS — C679 Malignant neoplasm of bladder, unspecified: Secondary | ICD-10-CM

## 2016-07-24 MED ORDER — OXYCODONE-ACETAMINOPHEN 10-325 MG PO TABS: 1.0000 | ORAL_TABLET | Freq: Four times a day (QID) | ORAL | 0 refills | Status: DC | PRN

## 2016-07-24 MED FILL — OXYCODONE-APAP 10-325: 10-325 | 30 days supply | Qty: 120 | Fill #0

## 2016-07-24 NOTE — Progress Notes (Signed)
Patient called today and states that he is getting no relief of breakthrough right hip pain with Norco,. He verbalized that he is taking as prescribed. I have discussed this with Lattie Haw in pharmacy and will change him to Percocet 10 mg (1 tablet) every 6 hours as needed for pain. Prescription is ready for him to pick up tomorrow morning.

## 2016-07-28 ENCOUNTER — Other Ambulatory Visit: Payer: Self-pay | Admitting: Hematology & Oncology

## 2016-07-28 DIAGNOSIS — C679 Malignant neoplasm of bladder, unspecified: Secondary | ICD-10-CM

## 2016-07-28 DIAGNOSIS — C775 Secondary and unspecified malignant neoplasm of intrapelvic lymph nodes: Principal | ICD-10-CM

## 2016-07-29 ENCOUNTER — Other Ambulatory Visit: Payer: Self-pay | Admitting: Family

## 2016-07-29 DIAGNOSIS — C775 Secondary and unspecified malignant neoplasm of intrapelvic lymph nodes: Principal | ICD-10-CM

## 2016-07-29 DIAGNOSIS — C679 Malignant neoplasm of bladder, unspecified: Secondary | ICD-10-CM

## 2016-07-29 DIAGNOSIS — R339 Retention of urine, unspecified: Secondary | ICD-10-CM

## 2016-07-29 NOTE — Progress Notes (Signed)
Patient call and is having issues with urinary retention and frequency while on flomax. We have referred him to Urology. All questions were answered and he is in agreement with the plan.

## 2016-07-30 ENCOUNTER — Ambulatory Visit: Payer: Medicare Other

## 2016-08-01 ENCOUNTER — Telehealth: Payer: Self-pay | Admitting: *Deleted

## 2016-08-01 NOTE — Telephone Encounter (Signed)
Scheduler from [facility] notifying this office that patient refused to make appointment after this office made a referral on his request.   Laverna Peace NP notified.

## 2016-08-04 DIAGNOSIS — C7951 Secondary malignant neoplasm of bone: Secondary | ICD-10-CM | POA: Diagnosis not present

## 2016-08-04 DIAGNOSIS — C679 Malignant neoplasm of bladder, unspecified: Secondary | ICD-10-CM | POA: Diagnosis not present

## 2016-08-04 DIAGNOSIS — C775 Secondary and unspecified malignant neoplasm of intrapelvic lymph nodes: Secondary | ICD-10-CM | POA: Diagnosis not present

## 2016-08-04 DIAGNOSIS — Z510 Encounter for antineoplastic radiation therapy: Secondary | ICD-10-CM | POA: Diagnosis not present

## 2016-08-05 ENCOUNTER — Ambulatory Visit
Admission: RE | Admit: 2016-08-05 | Discharge: 2016-08-05 | Disposition: A | Discharge status: Home / Self Care | Payer: Medicare Other | Source: Ambulatory Visit | Attending: Radiation Oncology | Admitting: Radiation Oncology

## 2016-08-05 ENCOUNTER — Ambulatory Visit: Payer: Medicare Other | Admitting: Radiation Oncology

## 2016-08-05 VITALS — BP 130/74 | HR 81 | Temp 98.3°F | Ht 73.5 in | Wt 184.2 lb

## 2016-08-05 DIAGNOSIS — C7951 Secondary malignant neoplasm of bone: Secondary | ICD-10-CM | POA: Diagnosis not present

## 2016-08-05 DIAGNOSIS — C679 Malignant neoplasm of bladder, unspecified: Secondary | ICD-10-CM | POA: Diagnosis not present

## 2016-08-05 DIAGNOSIS — C775 Secondary and unspecified malignant neoplasm of intrapelvic lymph nodes: Principal | ICD-10-CM

## 2016-08-05 DIAGNOSIS — Z510 Encounter for antineoplastic radiation therapy: Secondary | ICD-10-CM | POA: Diagnosis not present

## 2016-08-05 NOTE — Progress Notes (Addendum)
Dustin Jordan has completed 1 fraction to his right pelvis.  He reports having pain in his right hip today at a 5/10.  He said he is not using a fentanyl patch today because of inflammation/swelling in his right ankle.  He takes percocet as needed.    BP 130/74 (BP Location: Right Arm, Patient Position: Sitting)   Pulse 81   Temp 98.3 F (36.8 C) (Oral)   Ht 6' 1.5" (1.867 m)   Wt 184 lb 3.2 oz (83.6 kg)   SpO2 100%   BMI 23.97 kg/m    Wt Readings from Last 3 Encounters:  08/05/16 184 lb 3.2 oz (83.6 kg)  07/23/16 187 lb (84.8 kg)  07/16/16 185 lb 6.4 oz (84.1 kg)

## 2016-08-05 NOTE — Progress Notes (Signed)
  Radiation Oncology         [phone] ________________________________  Name: Dustin Jordan MRN: [id number]  Date: 08/05/2016  DOB: 12-23-1945  Weekly Radiation Therapy Management    ICD-9-CM ICD-10-CM   1. 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 . . . . . . . The patient presents for routine under treatment assessment.  Dustin Jordan has completed 1 fraction to his right pelvis.  He reports having pain in his right hip today at as a 5/10.  He said he is not using a fentanyl patch today because of inflammation/swelling in his right ankle.  He takes percocet as needed.                                  Set-up films were reviewed.                                 The chart was checked. Physical Findings. . .  height is 6' 1.5" (1.867 m) and weight is 184 lb 3.2 oz (83.6 kg). His oral temperature is 98.3 F (36.8 C). His blood pressure is 130/74 and his pulse is 81. His oxygen saturation is 100%. . Weight essentially stable.  No significant changes. Lungs are clear to auscultation bilaterally. Heart has regular rate and rhythm. Impression . . . . . . . The patient is tolerating radiation. Plan . . . . . . . . . . . . Continue treatment as planned.  ________________________________   Blair Promise, PhD, MD  This document serves as a record of services personally performed by Gery Pray, MD. It was created on his behalf by Darcus Austin, a trained medical scribe. The creation of this record is based on the scribe's personal observations and the provider's statements to them. This document has been checked and approved by the attending provider.

## 2016-08-05 NOTE — Progress Notes (Signed)
  Radiation Oncology         [phone] ________________________________  Name: Dustin Jordan MRN: [id number]  Date: 08/05/2016  DOB: 1946/01/30  Simulation Verification Note    ICD-9-CM ICD-10-CM   1. 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 The clinical setup was verified. Then port films were obtained and uploaded to the radiation oncology medical record software.  The treatment beams were carefully compared against the planned radiation fields. The position location and shape of the radiation fields was reviewed. They targeted volume of tissue appears to be appropriately covered by the radiation beams. Organs at risk appear to be excluded as planned.  Based on my personal review, I approved the simulation verification. The patient's treatment will proceed as planned.  -----------------------------------  Blair Promise, PhD, MD

## 2016-08-06 ENCOUNTER — Ambulatory Visit
Admission: RE | Admit: 2016-08-06 | Discharge: 2016-08-06 | Disposition: A | Discharge status: Home / Self Care | Payer: Medicare Other | Source: Ambulatory Visit | Attending: Radiation Oncology | Admitting: Radiation Oncology

## 2016-08-06 DIAGNOSIS — C7951 Secondary malignant neoplasm of bone: Secondary | ICD-10-CM | POA: Diagnosis not present

## 2016-08-06 DIAGNOSIS — C679 Malignant neoplasm of bladder, unspecified: Secondary | ICD-10-CM | POA: Diagnosis not present

## 2016-08-06 DIAGNOSIS — C775 Secondary and unspecified malignant neoplasm of intrapelvic lymph nodes: Secondary | ICD-10-CM | POA: Diagnosis not present

## 2016-08-06 DIAGNOSIS — Z510 Encounter for antineoplastic radiation therapy: Secondary | ICD-10-CM | POA: Diagnosis not present

## 2016-08-07 ENCOUNTER — Ambulatory Visit
Admission: RE | Admit: 2016-08-07 | Discharge: 2016-08-07 | Disposition: A | Discharge status: Home / Self Care | Payer: Medicare Other | Source: Ambulatory Visit | Attending: Radiation Oncology | Admitting: Radiation Oncology

## 2016-08-07 DIAGNOSIS — C679 Malignant neoplasm of bladder, unspecified: Secondary | ICD-10-CM | POA: Diagnosis not present

## 2016-08-07 DIAGNOSIS — C775 Secondary and unspecified malignant neoplasm of intrapelvic lymph nodes: Secondary | ICD-10-CM | POA: Diagnosis not present

## 2016-08-07 DIAGNOSIS — Z510 Encounter for antineoplastic radiation therapy: Secondary | ICD-10-CM | POA: Diagnosis not present

## 2016-08-07 DIAGNOSIS — C7951 Secondary malignant neoplasm of bone: Secondary | ICD-10-CM | POA: Diagnosis not present

## 2016-08-08 ENCOUNTER — Ambulatory Visit
Admission: RE | Admit: 2016-08-08 | Discharge: 2016-08-08 | Disposition: A | Discharge status: Home / Self Care | Payer: Medicare Other | Source: Ambulatory Visit | Attending: Radiation Oncology | Admitting: Radiation Oncology

## 2016-08-08 DIAGNOSIS — Z510 Encounter for antineoplastic radiation therapy: Secondary | ICD-10-CM | POA: Diagnosis not present

## 2016-08-08 DIAGNOSIS — C679 Malignant neoplasm of bladder, unspecified: Secondary | ICD-10-CM | POA: Diagnosis not present

## 2016-08-08 DIAGNOSIS — C7951 Secondary malignant neoplasm of bone: Secondary | ICD-10-CM | POA: Diagnosis not present

## 2016-08-08 DIAGNOSIS — C775 Secondary and unspecified malignant neoplasm of intrapelvic lymph nodes: Secondary | ICD-10-CM | POA: Diagnosis not present

## 2016-08-11 ENCOUNTER — Encounter (HOSPITAL_COMMUNITY)
Admission: RE | Admit: 2016-08-11 | Discharge: 2016-08-11 | Disposition: A | Discharge status: Home / Self Care | Payer: Medicare Other | Source: Ambulatory Visit | Attending: Family | Admitting: Family

## 2016-08-11 ENCOUNTER — Ambulatory Visit
Admission: RE | Admit: 2016-08-11 | Discharge: 2016-08-11 | Disposition: A | Discharge status: Home / Self Care | Payer: Medicare Other | Source: Ambulatory Visit | Attending: Radiation Oncology | Admitting: Radiation Oncology

## 2016-08-11 DIAGNOSIS — C7951 Secondary malignant neoplasm of bone: Secondary | ICD-10-CM | POA: Diagnosis not present

## 2016-08-11 DIAGNOSIS — C679 Malignant neoplasm of bladder, unspecified: Secondary | ICD-10-CM | POA: Diagnosis not present

## 2016-08-11 DIAGNOSIS — C775 Secondary and unspecified malignant neoplasm of intrapelvic lymph nodes: Secondary | ICD-10-CM | POA: Insufficient documentation

## 2016-08-11 DIAGNOSIS — Z510 Encounter for antineoplastic radiation therapy: Secondary | ICD-10-CM | POA: Diagnosis not present

## 2016-08-11 LAB — GLUCOSE, CAPILLARY: Glucose-Capillary: 79 mg/dL (ref 65–99)

## 2016-08-11 MED ORDER — FLUDEOXYGLUCOSE F - 18 (FDG) INJECTION
9.1700 | Freq: Once | INTRAVENOUS | Status: AC | PRN
  Administered 2016-08-11: 9.17 via INTRAVENOUS

## 2016-08-12 ENCOUNTER — Ambulatory Visit
Admission: RE | Admit: 2016-08-12 | Discharge: 2016-08-12 | Disposition: A | Discharge status: Home / Self Care | Payer: Medicare Other | Source: Ambulatory Visit | Attending: Radiation Oncology | Admitting: Radiation Oncology

## 2016-08-12 ENCOUNTER — Encounter: Payer: Self-pay | Admitting: Radiation Oncology

## 2016-08-12 VITALS — BP 127/79 | HR 84 | Temp 98.0°F | Ht 73.5 in | Wt 180.4 lb

## 2016-08-12 DIAGNOSIS — C679 Malignant neoplasm of bladder, unspecified: Secondary | ICD-10-CM

## 2016-08-12 DIAGNOSIS — C7951 Secondary malignant neoplasm of bone: Secondary | ICD-10-CM

## 2016-08-12 DIAGNOSIS — C775 Secondary and unspecified malignant neoplasm of intrapelvic lymph nodes: Principal | ICD-10-CM

## 2016-08-12 DIAGNOSIS — Z510 Encounter for antineoplastic radiation therapy: Secondary | ICD-10-CM | POA: Diagnosis not present

## 2016-08-12 NOTE — Progress Notes (Signed)
Dustin Jordan has completed 6 fractions to his right pelvis.  He reports having pain in his right hip at a 6/10.  He takes Percocet as needed. He denies having any bowel or bladder issues.  He reported having fatigue with the first few treatments and then he started eating before his treatments and said that has made a difference.  BP 127/79 (BP Location: Left Arm, Patient Position: Sitting)   Pulse 84   Temp 98 F (36.7 C) (Oral)   Ht 6' 1.5" (1.867 m)   Wt 180 lb 6.4 oz (81.8 kg)   SpO2 100%   BMI 23.48 kg/m    Wt Readings from Last 3 Encounters:  08/12/16 180 lb 6.4 oz (81.8 kg)  08/05/16 184 lb 3.2 oz (83.6 kg)  07/23/16 187 lb (84.8 kg)

## 2016-08-12 NOTE — Progress Notes (Signed)
  Radiation Oncology         [phone] ________________________________  Name: Dustin Jordan MRN: [id number]  Date: 08/12/2016  DOB: 1945/11/04  Weekly Radiation Therapy Management    ICD-9-CM ICD-10-CM   1. 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 . . . . . . . The patient presents for routine under treatment assessment.                                   The patient is without complaint. No nausea or diarrhea. Patient's pain is unchanged at this point                                 Set-up films were reviewed.                                 The chart was checked. Physical Findings. . .  height is 6' 1.5" (1.867 m) and weight is 180 lb 6.4 oz (81.8 kg). His oral temperature is 98 F (36.7 C). His blood pressure is 127/79 and his pulse is 84. His oxygen saturation is 100%. . Weight essentially stable.  Lungs are clear to auscultation bilaterally. Heart has regular rate and rhythm. No palpable cervical, supraclavicular, or axillary adenopathy. Abdomen soft, non-tender, normal bowel sounds.  Impression . . . . . . . The patient is tolerating radiation. Plan . . . . . . . . . . . . Continue treatment as planned.  ________________________________   Blair Promise, PhD, MD

## 2016-08-13 ENCOUNTER — Ambulatory Visit
Admission: RE | Admit: 2016-08-13 | Discharge: 2016-08-13 | Disposition: A | Discharge status: Home / Self Care | Payer: Medicare Other | Source: Ambulatory Visit | Attending: Radiation Oncology | Admitting: Radiation Oncology

## 2016-08-13 ENCOUNTER — Telehealth: Payer: Self-pay | Admitting: Family

## 2016-08-13 DIAGNOSIS — C7951 Secondary malignant neoplasm of bone: Secondary | ICD-10-CM | POA: Diagnosis not present

## 2016-08-13 DIAGNOSIS — C775 Secondary and unspecified malignant neoplasm of intrapelvic lymph nodes: Secondary | ICD-10-CM | POA: Diagnosis not present

## 2016-08-13 DIAGNOSIS — Z510 Encounter for antineoplastic radiation therapy: Secondary | ICD-10-CM | POA: Diagnosis not present

## 2016-08-13 DIAGNOSIS — C679 Malignant neoplasm of bladder, unspecified: Secondary | ICD-10-CM | POA: Diagnosis not present

## 2016-08-13 NOTE — Telephone Encounter (Signed)
Spoke with Dustin Jordan and gave him his PET scan results from earlier this week. All questions were answered and we will see him tomorrow morning for follow-up and treatment.

## 2016-08-13 NOTE — Telephone Encounter (Signed)
Called but no answer. Left message with call back number to call for scan results.

## 2016-08-14 ENCOUNTER — Ambulatory Visit: Payer: Medicare Other

## 2016-08-14 ENCOUNTER — Ambulatory Visit (HOSPITAL_BASED_OUTPATIENT_CLINIC_OR_DEPARTMENT_OTHER): Payer: Medicare Other

## 2016-08-14 ENCOUNTER — Ambulatory Visit (HOSPITAL_BASED_OUTPATIENT_CLINIC_OR_DEPARTMENT_OTHER): Payer: Medicare Other | Admitting: Hematology & Oncology

## 2016-08-14 ENCOUNTER — Other Ambulatory Visit (HOSPITAL_BASED_OUTPATIENT_CLINIC_OR_DEPARTMENT_OTHER): Payer: Medicare Other

## 2016-08-14 VITALS — BP 131/72 | HR 81 | Temp 98.0°F | Resp 18 | Wt 184.0 lb

## 2016-08-14 DIAGNOSIS — C779 Secondary and unspecified malignant neoplasm of lymph node, unspecified: Secondary | ICD-10-CM

## 2016-08-14 DIAGNOSIS — C775 Secondary and unspecified malignant neoplasm of intrapelvic lymph nodes: Secondary | ICD-10-CM

## 2016-08-14 DIAGNOSIS — C7951 Secondary malignant neoplasm of bone: Secondary | ICD-10-CM

## 2016-08-14 DIAGNOSIS — C7800 Secondary malignant neoplasm of unspecified lung: Secondary | ICD-10-CM

## 2016-08-14 DIAGNOSIS — Z452 Encounter for adjustment and management of vascular access device: Secondary | ICD-10-CM | POA: Diagnosis not present

## 2016-08-14 DIAGNOSIS — C679 Malignant neoplasm of bladder, unspecified: Secondary | ICD-10-CM | POA: Diagnosis not present

## 2016-08-14 DIAGNOSIS — Z5111 Encounter for antineoplastic chemotherapy: Secondary | ICD-10-CM | POA: Diagnosis not present

## 2016-08-14 LAB — CMP (CANCER CENTER ONLY)
ALK PHOS: 57 U/L (ref 26–84)
ALT: 21 U/L (ref 10–47)
AST: 36 U/L (ref 11–38)
Albumin: 3.2 g/dL — ABNORMAL LOW (ref 3.3–5.5)
BILIRUBIN TOTAL: 0.6 mg/dL (ref 0.20–1.60)
BUN: 19 mg/dL (ref 7–22)
CHLORIDE: 102 meq/L (ref 98–108)
CO2: 30 mEq/L (ref 18–33)
CREATININE: 1.5 mg/dL — AB (ref 0.6–1.2)
Calcium: 9.2 mg/dL (ref 8.0–10.3)
Glucose, Bld: 87 mg/dL (ref 73–118)
Potassium: 4.1 mEq/L (ref 3.3–4.7)
SODIUM: 139 meq/L (ref 128–145)
TOTAL PROTEIN: 7.1 g/dL (ref 6.4–8.1)

## 2016-08-14 LAB — CBC WITH DIFFERENTIAL (CANCER CENTER ONLY)
BASO#: 0 10*3/uL (ref 0.0–0.2)
BASO%: 0.2 % (ref 0.0–2.0)
EOS ABS: 0.1 10*3/uL (ref 0.0–0.5)
EOS%: 1.4 % (ref 0.0–7.0)
HCT: 27.7 % — ABNORMAL LOW (ref 38.7–49.9)
HEMOGLOBIN: 8.9 g/dL — AB (ref 13.0–17.1)
LYMPH#: 1.3 10*3/uL (ref 0.9–3.3)
LYMPH%: 20.7 % (ref 14.0–48.0)
MCH: 28.7 pg (ref 28.0–33.4)
MCHC: 32.1 g/dL (ref 32.0–35.9)
MCV: 89 fL (ref 82–98)
MONO#: 2.2 10*3/uL — ABNORMAL HIGH (ref 0.1–0.9)
MONO%: 34.3 % — AB (ref 0.0–13.0)
NEUT%: 43.4 % (ref 40.0–80.0)
NEUTROS ABS: 2.7 10*3/uL (ref 1.5–6.5)
PLATELETS: 223 10*3/uL (ref 145–400)
RBC: 3.1 10*6/uL — AB (ref 4.20–5.70)
RDW: 29.7 % — ABNORMAL HIGH (ref 11.1–15.7)
WBC: 6.3 10*3/uL (ref 4.0–10.0)

## 2016-08-14 LAB — LACTATE DEHYDROGENASE: LDH: 370 U/L — AB (ref 125–245)

## 2016-08-14 LAB — TECHNOLOGIST REVIEW CHCC SATELLITE

## 2016-08-14 MED ORDER — STERILE WATER FOR INJECTION IJ SOLN
INTRAMUSCULAR | Status: AC
  Filled 2016-08-14: qty 10

## 2016-08-14 MED ORDER — SODIUM CHLORIDE 0.9 % IV SOLN
800.0000 mg | Freq: Once | INTRAVENOUS | Status: AC
  Administered 2016-08-14: 800 mg via INTRAVENOUS
  Filled 2016-08-14: qty 20

## 2016-08-14 MED ORDER — PALONOSETRON HCL INJECTION 0.25 MG/5ML
0.2500 mg | Freq: Once | INTRAVENOUS | Status: AC
  Administered 2016-08-14: 0.25 mg via INTRAVENOUS

## 2016-08-14 MED ORDER — SODIUM CHLORIDE 0.9 % IV SOLN
Freq: Once | INTRAVENOUS | Status: AC
  Administered 2016-08-14: 12:00:00 via INTRAVENOUS
  Filled 2016-08-14: qty 5

## 2016-08-14 MED ORDER — ALTEPLASE 2 MG IJ SOLR
2.0000 mg | Freq: Once | INTRAMUSCULAR | Status: AC | PRN
  Administered 2016-08-14: 2 mg
  Filled 2016-08-14: qty 2

## 2016-08-14 MED ORDER — ALTEPLASE 2 MG IJ SOLR
INTRAMUSCULAR | Status: AC
  Filled 2016-08-14: qty 2

## 2016-08-14 MED ORDER — POTASSIUM CHLORIDE 2 MEQ/ML IV SOLN
Freq: Once | INTRAVENOUS | Status: AC
  Administered 2016-08-14: 10:00:00 via INTRAVENOUS
  Filled 2016-08-14: qty 10

## 2016-08-14 MED ORDER — HEPARIN SOD (PORK) LOCK FLUSH 100 UNIT/ML IV SOLN
500.0000 [IU] | Freq: Once | INTRAVENOUS | Status: AC | PRN
  Administered 2016-08-14: 500 [IU]
  Filled 2016-08-14: qty 5

## 2016-08-14 MED ORDER — SODIUM CHLORIDE 0.9 % IV SOLN
60.0000 mg/m2 | Freq: Once | INTRAVENOUS | Status: AC
  Administered 2016-08-14: 123 mg via INTRAVENOUS
  Filled 2016-08-14: qty 100

## 2016-08-14 MED ORDER — SODIUM CHLORIDE 0.9% FLUSH
10.0000 mL | INTRAVENOUS | Status: DC | PRN
  Administered 2016-08-14: 10 mL
  Filled 2016-08-14: qty 10

## 2016-08-14 MED ORDER — PALONOSETRON HCL INJECTION 0.25 MG/5ML
INTRAVENOUS | Status: AC
  Filled 2016-08-14: qty 5

## 2016-08-14 MED ORDER — CYANOCOBALAMIN 1000 MCG/ML IJ SOLN
1000.0000 ug | Freq: Once | INTRAMUSCULAR | Status: AC
  Administered 2016-08-14: 1000 ug via INTRAMUSCULAR

## 2016-08-14 MED ORDER — CYANOCOBALAMIN 1000 MCG/ML IJ SOLN
INTRAMUSCULAR | Status: AC
  Filled 2016-08-14: qty 1

## 2016-08-14 NOTE — Progress Notes (Signed)
Hematology and Oncology Follow Up Visit  [name] [id number] [birth year] 70 y.o. 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 is here today for a follow-up. Overall, I that he is tolerating his chemotherapy pretty well.  We did go ahead and get a PET scan on him. Thankfully, the PET scan showed that he was responding. He had decreased and his pulmonary metastasis. He had slight improvement in his bone metastases. There were no evidence of any new disease.  He is doing radiation therapy now. He has not halfway through the radiation. He is already getting some relief with his right hip. He is happy about this.  He is not having any problems with bowels or bladder. He's having no cough. His appetite is good. He's had no nausea or vomiting. He's had no leg swelling.  Overall, he has a performance status of ECOG 0.    Medications:    Medication List       Accurate as of 08/14/16  8:42 AM. Always use your most recent med list.          dexamethasone 4 MG tablet Commonly known as:  DECADRON Take 1 tab two times a day the day before Alimta chemo. Take 2 tabs the day after chemo, then take 2 tabs two times a day for 2 days.   fentaNYL 75 MCG/HR Commonly known as:  DURAGESIC - dosed mcg/hr Place 1 patch (75 mcg total) onto the skin every 3 (three) days.   folic acid 1 MG tablet Commonly known as:  FOLVITE Take 1 tablet (1 mg total) by mouth daily. Start 5-7 days before Alimta chemotherapy. Continue until 21 days after Alimta completed.   hydrochlorothiazide 12.5 MG capsule Commonly known as:  MICROZIDE TAKE 1 CAPSULE BY MOUTH EVERY MORNING   lidocaine-prilocaine cream Commonly known as:  EMLA Apply to affected area  once   naproxen 500 MG tablet Commonly known as:  NAPROSYN TAKE 1 TABLET(500 MG) BY MOUTH TWICE DAILY WITH A MEAL   ondansetron 8 MG tablet Commonly known as:  ZOFRAN Take 1 tablet (8 mg total) by mouth 2 (two) times daily as needed (Nausea or vomiting). Start if needed on the third day after chemotherapy.   oxyCODONE-acetaminophen 10-325 MG tablet Commonly known as:  PERCOCET Take 1 tablet by mouth every 6 (six) hours as needed for pain.   polyethylene glycol powder powder Commonly known as:  GLYCOLAX/MIRALAX Take 17 g by mouth 2 (two) times daily as needed.   prochlorperazine 10 MG tablet Commonly known as:  COMPAZINE Take 1 tablet (10 mg total) by mouth every 6 (six) hours as needed (Nausea or vomiting).   tamsulosin 0.4 MG Caps capsule Commonly known as:  FLOMAX TAKE 2 CAPSULES BY MOUTH EVERY DAY AS DIRECTED       Allergies: No Known Allergies  Past Medical History, Surgical history, Social history, and Family History were reviewed and updated.  Review of Systems: All other 10 point review of systems is negative.   Physical Exam:  weight is 184 lb (83.5 kg). His oral temperature is 98 F (36.7 C). His blood pressure is 131/72 and his pulse is 81. His respiration is 18 and oxygen saturation is 100%.   Wt Readings  from Last 3 Encounters:  08/14/16 184 lb (83.5 kg)  08/12/16 180 lb 6.4 oz (81.8 kg)  08/05/16 184 lb 3.2 oz (83.6 kg)    Well-developed and well-nourished African [place] male in no obvious distress. Head and neck exam shows no ocular or oral lesions. He has no palpable cervical or supraclavicular lymph nodes. Lungs are clear. He has no rales, wheezes or rhonchi. Cardiac exam regular rate and rhythm with no murmurs, rubs or bruits. Abdomen is soft. Has good bowel sounds. There is no fluid wave. There is no palpable liver or spleen tip. Back exam shows some tenderness over the lateral right pelvis. No swelling is noted. No warmth is noted. Extremity shows no  clubbing, cyanosis or edema. Skin exam shows no rashes, ecchymoses or petechia. Neurological exam shows no focal neurological deficits.  Lab Results  Component Value Date   WBC 6.0 07/23/2016   HGB 9.8 (L) 07/23/2016   HCT 29.8 (L) 07/23/2016   MCV 82 07/23/2016   PLT 200 07/23/2016   Lab Results  Component Value Date   FERRITIN 122 01/28/2016   IRON 51 01/28/2016   TIBC 348 01/28/2016   UIBC 297 01/28/2016   IRONPCTSAT 15 (L) 01/28/2016   Lab Results  Component Value Date   RBC 3.62 (L) 07/23/2016   No results found for: KPAFRELGTCHN, LAMBDASER, KAPLAMBRATIO No results found for: IGGSERUM, IGA, IGMSERUM No results found for: Odetta Pink, SPEI   Chemistry      Component Value Date/Time   NA 136 07/23/2016 0848   K 4.2 07/23/2016 0848   CL 98 07/23/2016 0848   CO2 27 07/23/2016 0848   BUN 30 (H) 07/23/2016 0848   CREATININE 1.2 07/23/2016 0848      Component Value Date/Time   CALCIUM 9.2 07/23/2016 0848   ALKPHOS 65 07/23/2016 0848   AST 36 07/23/2016 0848   ALT 19 07/23/2016 0848   BILITOT 0.60 07/23/2016 0848     Impression and Plan: [name] is very pleasant 70 yo African American male with poorly differentiated metastatic bladder cancer. Again, he has progressed despite immunotherapy.  Hopefully, the cis-platinum/Alimta i will continue to help. Our treatment options really are not that great after this. I know that I knew calk came out that looked at [place] with Cyramza. This improved survival by about 1.5 months.   For now, we will proceed with 3 more cycles of treatment and then do another PET scan.   It looks like he will finish up radiation next week or so.   I spent about 30 minutes with him today. he radiation.    [name], MD 11/16/20178:42 AM

## 2016-08-14 NOTE — Progress Notes (Signed)
Urine output 300 ccs prior to Cisplatin.

## 2016-08-14 NOTE — Patient Instructions (Signed)
[place] [place] Discharge Instructions for Patients Receiving Chemotherapy  Today you received the following chemotherapy agents Cisplatin, Alimta  To help prevent nausea and vomiting after your treatment, we encourage you to take your nausea medication    If you develop nausea and vomiting that is not controlled by your nausea medication, call the clinic.   BELOW ARE SYMPTOMS THAT SHOULD BE REPORTED IMMEDIATELY:  *FEVER GREATER THAN 100.5 F  *CHILLS WITH OR WITHOUT FEVER  NAUSEA AND VOMITING THAT IS NOT CONTROLLED WITH YOUR NAUSEA MEDICATION  *UNUSUAL SHORTNESS OF BREATH  *UNUSUAL BRUISING OR BLEEDING  TENDERNESS IN MOUTH AND THROAT WITH OR WITHOUT PRESENCE OF ULCERS  *URINARY PROBLEMS  *BOWEL PROBLEMS  UNUSUAL RASH Items with * indicate a potential emergency and should be followed up as soon as possible.  Feel free to call the clinic you have any questions or concerns. The clinic phone number is [phone].  Please show the [place] at check-in to the Emergency Department and triage nurse.

## 2016-08-15 ENCOUNTER — Ambulatory Visit
Admission: RE | Admit: 2016-08-15 | Discharge: 2016-08-15 | Disposition: A | Discharge status: Home / Self Care | Payer: Medicare Other | Source: Ambulatory Visit | Attending: Radiation Oncology | Admitting: Radiation Oncology

## 2016-08-15 DIAGNOSIS — C679 Malignant neoplasm of bladder, unspecified: Secondary | ICD-10-CM | POA: Diagnosis not present

## 2016-08-15 DIAGNOSIS — Z510 Encounter for antineoplastic radiation therapy: Secondary | ICD-10-CM | POA: Diagnosis not present

## 2016-08-15 DIAGNOSIS — C7951 Secondary malignant neoplasm of bone: Secondary | ICD-10-CM | POA: Diagnosis not present

## 2016-08-15 DIAGNOSIS — C775 Secondary and unspecified malignant neoplasm of intrapelvic lymph nodes: Secondary | ICD-10-CM | POA: Diagnosis not present

## 2016-08-17 ENCOUNTER — Ambulatory Visit: Payer: Medicare Other

## 2016-08-18 ENCOUNTER — Ambulatory Visit: Payer: Medicare Other

## 2016-08-19 ENCOUNTER — Ambulatory Visit: Admission: RE | Admit: 2016-08-19 | Payer: Medicare Other | Source: Ambulatory Visit | Admitting: Radiation Oncology

## 2016-08-19 ENCOUNTER — Ambulatory Visit: Admission: RE | Admit: 2016-08-19 | Payer: Medicare Other | Source: Ambulatory Visit

## 2016-08-20 ENCOUNTER — Ambulatory Visit: Admission: RE | Admit: 2016-08-20 | Payer: Medicare Other | Source: Ambulatory Visit

## 2016-08-20 ENCOUNTER — Ambulatory Visit: Payer: Medicare Other

## 2016-08-22 ENCOUNTER — Ambulatory Visit: Payer: Medicare Other

## 2016-08-25 ENCOUNTER — Ambulatory Visit: Payer: Medicare Other

## 2016-08-25 ENCOUNTER — Other Ambulatory Visit: Payer: Self-pay | Admitting: *Deleted

## 2016-08-25 ENCOUNTER — Ambulatory Visit
Admission: RE | Admit: 2016-08-25 | Discharge: 2016-08-25 | Disposition: A | Discharge status: Home / Self Care | Payer: Medicare Other | Source: Ambulatory Visit | Attending: Radiation Oncology | Admitting: Radiation Oncology

## 2016-08-25 DIAGNOSIS — C679 Malignant neoplasm of bladder, unspecified: Secondary | ICD-10-CM | POA: Diagnosis not present

## 2016-08-25 DIAGNOSIS — C7951 Secondary malignant neoplasm of bone: Secondary | ICD-10-CM | POA: Diagnosis not present

## 2016-08-25 DIAGNOSIS — Z510 Encounter for antineoplastic radiation therapy: Secondary | ICD-10-CM | POA: Diagnosis not present

## 2016-08-25 DIAGNOSIS — C775 Secondary and unspecified malignant neoplasm of intrapelvic lymph nodes: Secondary | ICD-10-CM | POA: Diagnosis not present

## 2016-08-25 MED ORDER — FENTANYL 75 MCG/HR TD PT72: 75.0000 ug | MEDICATED_PATCH | TRANSDERMAL | 0 refills | Status: DC

## 2016-08-26 ENCOUNTER — Encounter: Payer: Self-pay | Admitting: Radiation Oncology

## 2016-08-26 ENCOUNTER — Ambulatory Visit
Admission: RE | Admit: 2016-08-26 | Discharge: 2016-08-26 | Disposition: A | Discharge status: Home / Self Care | Payer: Medicare Other | Source: Ambulatory Visit | Attending: Radiation Oncology | Admitting: Radiation Oncology

## 2016-08-26 ENCOUNTER — Ambulatory Visit: Payer: Medicare Other

## 2016-08-26 VITALS — BP 150/92 | HR 90 | Temp 97.8°F | Resp 20 | Wt 183.2 lb

## 2016-08-26 DIAGNOSIS — C7951 Secondary malignant neoplasm of bone: Secondary | ICD-10-CM | POA: Diagnosis not present

## 2016-08-26 DIAGNOSIS — Z510 Encounter for antineoplastic radiation therapy: Secondary | ICD-10-CM | POA: Diagnosis not present

## 2016-08-26 DIAGNOSIS — C775 Secondary and unspecified malignant neoplasm of intrapelvic lymph nodes: Secondary | ICD-10-CM | POA: Diagnosis not present

## 2016-08-26 DIAGNOSIS — C679 Malignant neoplasm of bladder, unspecified: Secondary | ICD-10-CM | POA: Diagnosis not present

## 2016-08-26 MED FILL — fentaNYL 75 MCG/HR PT72: 75 | 30 days supply | Qty: 10 | Fill #0

## 2016-08-26 NOTE — Progress Notes (Signed)
  Radiation Oncology         [phone] ________________________________  Name: Dustin Jordan MRN: [id number]  Date: 08/26/2016  DOB: Oct 04, 1945  Weekly Radiation Therapy Management    ICD-9-CM ICD-10-CM   1. 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 . . . . . . . The patient presents for routine under treatment assessment.                                   The patient has completed 10 fractions to the pelvis. Denies nausea. The patient denies any bladder issues. He reports taking Flomax daily. The patient reports regular bowel movements. He says his appetite is good. He reports some mild fatigue. The patient has a 75 mcg fentanyl patch on.  Pain unchanged at this point                                  Set-up films were reviewed.                                 The chart was checked. Physical Findings. . .  weight is 183 lb 3.2 oz (83.1 kg). His oral temperature is 97.8 F (36.6 C). His blood pressure is 150/92 (abnormal) and his pulse is 90. His respiration is 20. . Weight essentially stable.  Lungs are clear to auscultation bilaterally. Heart has regular rate and rhythm. No palpable cervical, supraclavicular, or axillary adenopathy. Abdomen soft, non-tender, normal bowel sounds. Impression . . . . . . . The patient is tolerating radiation. Plan . . . . . . . . . . . . Continue treatment as planned.  ________________________________   Blair Promise, PhD, MD  This document serves as a record of services personally performed by Gery Pray, MD. It was created on his behalf by Maryla Morrow, a trained medical scribe. The creation of this record is based on the scribe's personal observations and the provider's statements to them. This document has been checked and approved by the attending provider.

## 2016-08-26 NOTE — Progress Notes (Addendum)
Weekly rad txs pelvis, 10 completed,   no bladder issues,  takes flomax daily, regular bowel movements,  Patient appetite good, mild fatigued, fentanyl patch on 61mcg,  BP (!) 150/92 (BP Location: Left Arm, Patient Position: Sitting, Cuff Size: Normal)   Pulse 90   Temp 97.8 F (36.6 C) (Oral)   Resp 20   Wt 183 lb 3.2 oz (83.1 kg)   BMI 23.84 kg/m   Wt Readings from Last 3 Encounters:  08/26/16 183 lb 3.2 oz (83.1 kg)  08/14/16 184 lb (83.5 kg)  08/12/16 180 lb 6.4 oz (81.8 kg)

## 2016-08-27 ENCOUNTER — Ambulatory Visit: Payer: Medicare Other

## 2016-08-27 ENCOUNTER — Ambulatory Visit
Admission: RE | Admit: 2016-08-27 | Discharge: 2016-08-27 | Disposition: A | Discharge status: Home / Self Care | Payer: Medicare Other | Source: Ambulatory Visit | Attending: Radiation Oncology | Admitting: Radiation Oncology

## 2016-08-27 ENCOUNTER — Telehealth: Payer: Self-pay | Admitting: *Deleted

## 2016-08-27 ENCOUNTER — Other Ambulatory Visit: Payer: Self-pay | Admitting: Family

## 2016-08-27 DIAGNOSIS — C775 Secondary and unspecified malignant neoplasm of intrapelvic lymph nodes: Principal | ICD-10-CM

## 2016-08-27 DIAGNOSIS — C7951 Secondary malignant neoplasm of bone: Secondary | ICD-10-CM

## 2016-08-27 DIAGNOSIS — Z510 Encounter for antineoplastic radiation therapy: Secondary | ICD-10-CM | POA: Diagnosis not present

## 2016-08-27 DIAGNOSIS — C679 Malignant neoplasm of bladder, unspecified: Secondary | ICD-10-CM

## 2016-08-27 MED ORDER — FENTANYL 75 MCG/HR TD PT72: 75.0000 ug | MEDICATED_PATCH | TRANSDERMAL | 0 refills | Status: DC

## 2016-08-27 NOTE — Progress Notes (Signed)
Patient called with c/o pain not controlled with duragesic patch and Percocet 10-325 mg tab PO every 6 hours. I spoke with Dr. [name] and we will increase his Duragesic patch frequency to every 48 hours. Patient will pick up script later today.

## 2016-08-27 NOTE — Telephone Encounter (Signed)
Patient is stating that his pain is no longer being controlled with the percocet 10/325 q6h. He would like something stronger.  Spoke with Laverna Peace NP and she will provide patient with a new prescription.   Patient doesn't feel like increasing patch frequency will make a difference. He wants to know what he will get if the patches don't work. Explained to patient that we need to try this increase first, and that it might take 24-36h before he feels a difference. He is instructed to call the office back if this doesn't help and then a new plan can be discussed with the providers.  Patient aware of new prescription ready for pick up.

## 2016-08-28 ENCOUNTER — Ambulatory Visit: Payer: Medicare Other

## 2016-08-28 ENCOUNTER — Ambulatory Visit: Admission: RE | Admit: 2016-08-28 | Payer: Medicare Other | Source: Ambulatory Visit

## 2016-08-29 ENCOUNTER — Ambulatory Visit: Payer: Medicare Other

## 2016-08-29 ENCOUNTER — Other Ambulatory Visit: Payer: Self-pay | Admitting: Family Medicine

## 2016-08-29 ENCOUNTER — Ambulatory Visit
Admission: RE | Admit: 2016-08-29 | Discharge: 2016-08-29 | Disposition: A | Discharge status: Home / Self Care | Payer: Medicare Other | Source: Ambulatory Visit | Attending: Radiation Oncology | Admitting: Radiation Oncology

## 2016-08-29 DIAGNOSIS — C7951 Secondary malignant neoplasm of bone: Secondary | ICD-10-CM | POA: Diagnosis not present

## 2016-08-29 DIAGNOSIS — I10 Essential (primary) hypertension: Secondary | ICD-10-CM

## 2016-08-29 DIAGNOSIS — Z510 Encounter for antineoplastic radiation therapy: Secondary | ICD-10-CM | POA: Diagnosis not present

## 2016-08-29 DIAGNOSIS — C679 Malignant neoplasm of bladder, unspecified: Secondary | ICD-10-CM | POA: Diagnosis not present

## 2016-08-29 DIAGNOSIS — C775 Secondary and unspecified malignant neoplasm of intrapelvic lymph nodes: Secondary | ICD-10-CM | POA: Diagnosis not present

## 2016-08-31 NOTE — Telephone Encounter (Signed)
06/2016 last ov 07/2016 last lab

## 2016-09-01 ENCOUNTER — Ambulatory Visit
Admission: RE | Admit: 2016-09-01 | Discharge: 2016-09-01 | Disposition: A | Discharge status: Home / Self Care | Payer: Medicare Other | Source: Ambulatory Visit | Attending: Radiation Oncology | Admitting: Radiation Oncology

## 2016-09-01 ENCOUNTER — Ambulatory Visit: Payer: Medicare Other

## 2016-09-01 DIAGNOSIS — Z510 Encounter for antineoplastic radiation therapy: Secondary | ICD-10-CM | POA: Diagnosis not present

## 2016-09-01 DIAGNOSIS — C679 Malignant neoplasm of bladder, unspecified: Secondary | ICD-10-CM | POA: Diagnosis not present

## 2016-09-01 DIAGNOSIS — C7951 Secondary malignant neoplasm of bone: Secondary | ICD-10-CM | POA: Diagnosis not present

## 2016-09-01 DIAGNOSIS — C775 Secondary and unspecified malignant neoplasm of intrapelvic lymph nodes: Secondary | ICD-10-CM | POA: Diagnosis not present

## 2016-09-01 IMAGING — DX DG CHEST 2V
2 series · 2 of 2 positions shown · non-contrast
Comparison: PET-CT 02/04/2016

CLINICAL DATA: Chest pain, cough, and shortness of breath. History
of metastatic bladder cancer.

EXAM:
CHEST  2 VIEW

[chest pa]
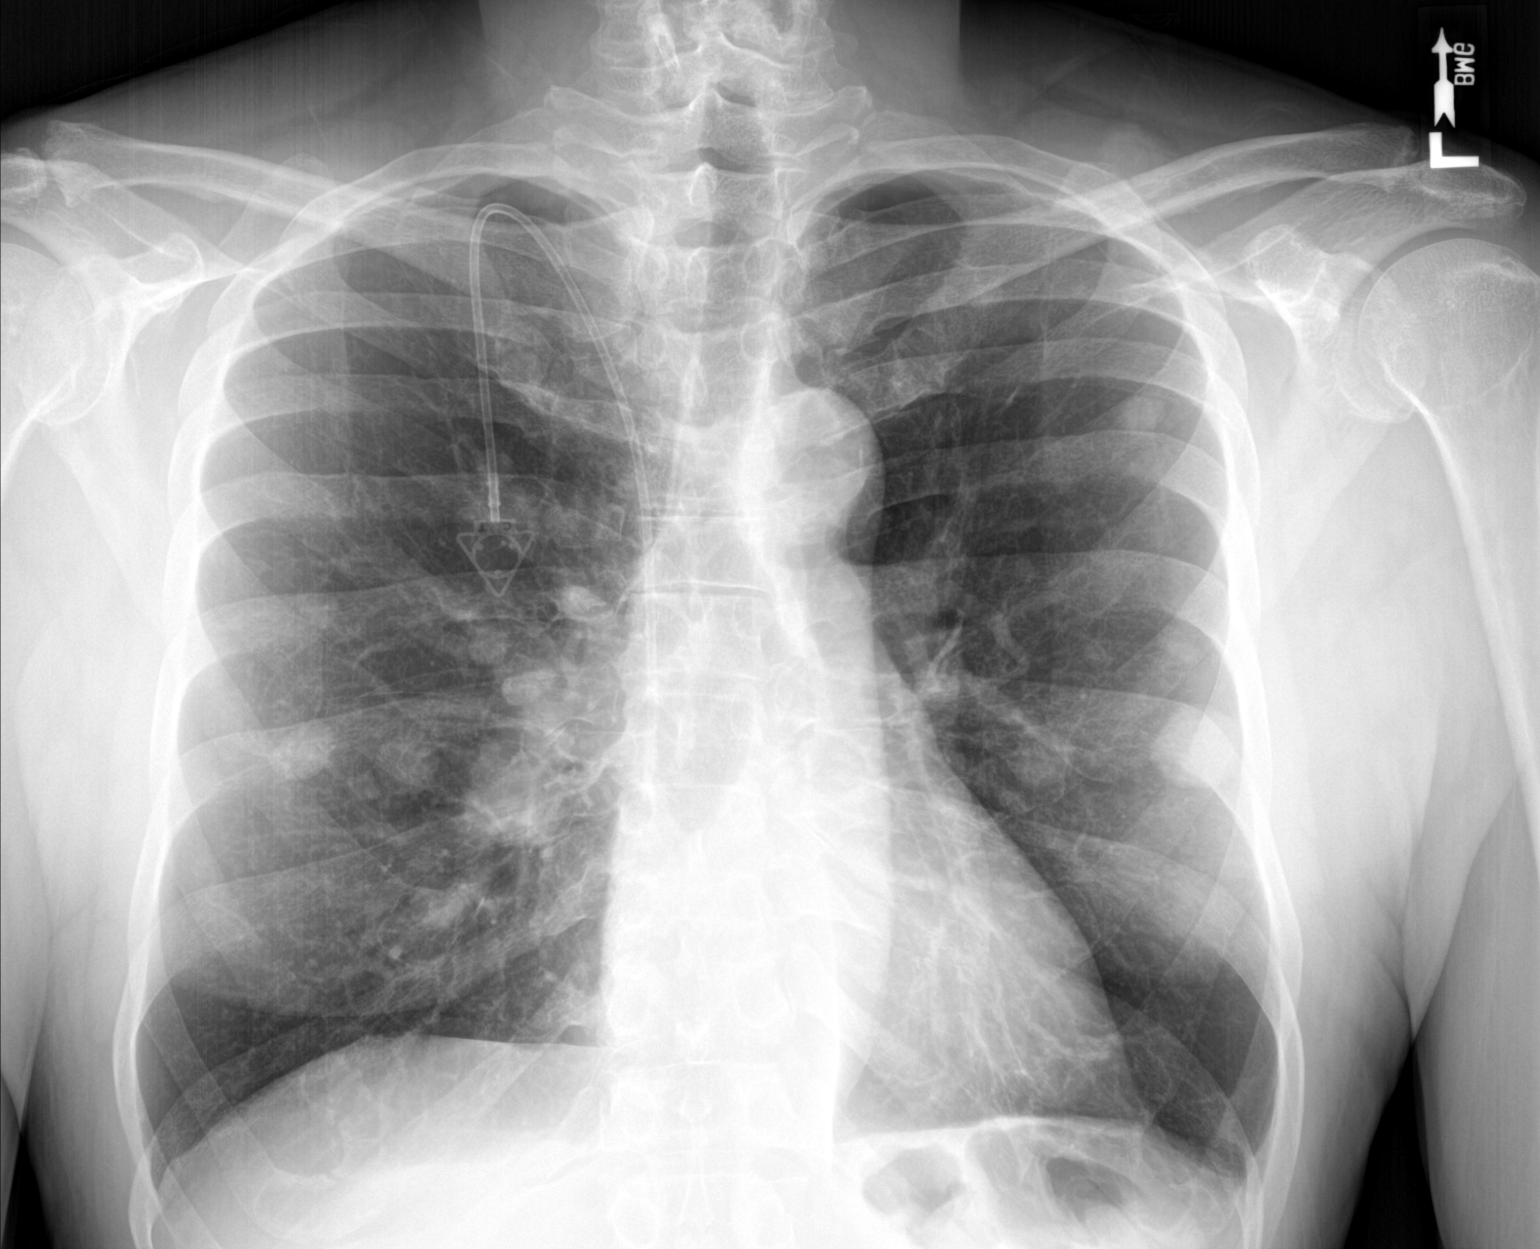

[chest lat]
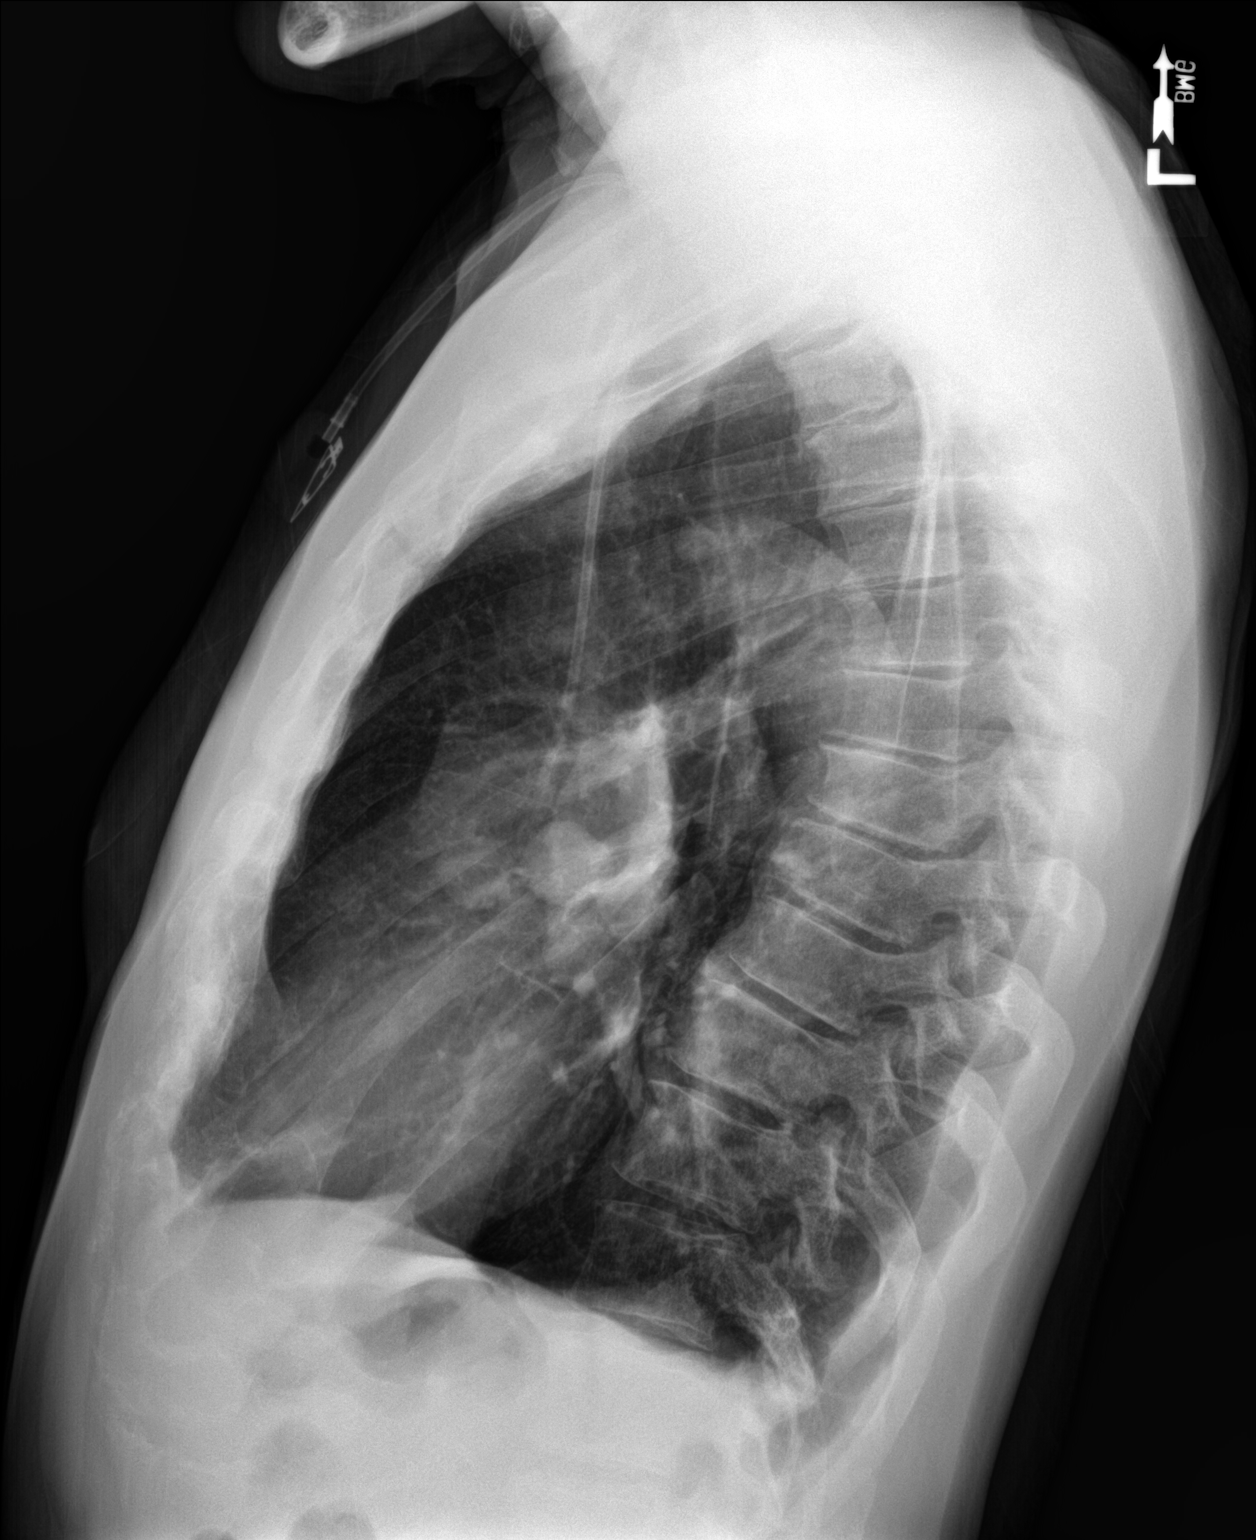

[2 of 2 positions shown; findings below may reference images not displayed]

FINDINGS: A right jugular Port-A-Cath is in place with tip overlying the lower
SVC. Mild leftward tracheal deviation at the level of the thoracic
inlet corresponds to known right thyroid lobe enlargement. The lungs
are well inflated. There are multiple new round pulmonary nodules
bilaterally, the largest measuring 2.6 cm in the left upper lobe.
There is no evidence of pneumonia, edema, or pneumothorax. There is
slight blunting of the left costophrenic angle which may reflect a
trace pleural effusion. No acute osseous abnormality is identified.
IMPRESSION: New bilateral pulmonary nodules consistent with metastases.

These results will be called to the ordering clinician or
representative by the Radiologist Assistant, and communication
documented in the PACS or zVision Dashboard.

## 2016-09-02 ENCOUNTER — Ambulatory Visit
Admission: RE | Admit: 2016-09-02 | Discharge: 2016-09-02 | Disposition: A | Discharge status: Home / Self Care | Payer: Medicare Other | Source: Ambulatory Visit | Attending: Radiation Oncology | Admitting: Radiation Oncology

## 2016-09-02 ENCOUNTER — Encounter: Payer: Self-pay | Admitting: Radiation Oncology

## 2016-09-02 ENCOUNTER — Ambulatory Visit: Payer: Medicare Other

## 2016-09-02 VITALS — BP 142/79 | HR 83 | Temp 98.1°F | Ht 73.5 in | Wt 188.8 lb

## 2016-09-02 DIAGNOSIS — C7951 Secondary malignant neoplasm of bone: Secondary | ICD-10-CM

## 2016-09-02 DIAGNOSIS — Z510 Encounter for antineoplastic radiation therapy: Secondary | ICD-10-CM | POA: Diagnosis not present

## 2016-09-02 DIAGNOSIS — C775 Secondary and unspecified malignant neoplasm of intrapelvic lymph nodes: Secondary | ICD-10-CM | POA: Diagnosis not present

## 2016-09-02 DIAGNOSIS — C679 Malignant neoplasm of bladder, unspecified: Secondary | ICD-10-CM | POA: Diagnosis not present

## 2016-09-02 NOTE — Progress Notes (Signed)
  Radiation Oncology         [phone] ________________________________  Name: Dustin Jordan MRN: [id number]  Date: 09/02/2016  DOB: [birth year]  End of Treatment Note  Diagnosis:   Metastatic bladder cancer/ossoeous and lymph mets     Indication for treatment:  Palliative       Radiation treatment dates:   08/06/16 - 09/02/16  Site/dose:   Right Pelvis was treated to 35 Gy in 14 fractions of 2.5 Gy  Beams/energy:   3D  //  10X, 15X  Narrative: The patient tolerated radiation treatment relatively well. He experienced pain that improved over the course of treatment, but is not yet completely gone. The patient reported some fatigue. He denied any nausea or diarrhea issues.  Plan: The patient has completed radiation treatment. The patient will return to radiation oncology clinic for routine followup in one month. I advised them to call or return sooner if they have any questions or concerns related to their recovery or treatment.  -----------------------------------  Blair Promise, PhD, MD  This document serves as a record of services personally performed by Gery Pray, MD. It was created on his behalf by Maryla Morrow, a trained medical scribe. The creation of this record is based on the scribe's personal observations and the provider's statements to them. This document has been checked and approved by the attending provider.

## 2016-09-02 NOTE — Progress Notes (Signed)
Kaesyn has completed treatment with 14 fractions to his right pelvis.  He reports his pain has improved but is not gone.  He is using fentanyl patch and percocet for break through.  He reports having fatigue and denies having skin irritation.  He has been given a one month follow up appointment.  BP (!) 142/79 (BP Location: Right Arm, Patient Position: Sitting)   Pulse 83   Temp 98.1 F (36.7 C) (Oral)   Ht 6' 1.5" (1.867 m)   Wt 188 lb 12.8 oz (85.6 kg)   SpO2 98%   BMI 24.57 kg/m    Wt Readings from Last 3 Encounters:  09/02/16 188 lb 12.8 oz (85.6 kg)  08/26/16 183 lb 3.2 oz (83.1 kg)  08/14/16 184 lb (83.5 kg)

## 2016-09-02 NOTE — Progress Notes (Signed)
  Radiation Oncology         [phone] ________________________________  Name: Dustin Jordan MRN: [id number]  Date: 09/02/2016  DOB: [birth year]  Weekly Radiation Therapy Management    ICD-9-CM ICD-10-CM   1. Secondary malignant neoplasm of bone (HCC) 198.5 C79.51      Current Dose: 35 Gy     Planned Dose:  35 Gy  Narrative . . . . . . . . The patient presents for routine under treatment assessment.                                    The patient has completed treatment with 14 fractions to his right pelvis. He reports his pain has improved but is not completely gone. He is using a Fentanyl patch and Percocet for breakthrough pain. He reports having fatigue. Denies skin irritation. The patient expressed frustration with inability to exercise.                                  Set-up films were reviewed.                                 The chart was checked. Physical Findings. . .  height is 6' 1.5" (1.867 m) and weight is 188 lb 12.8 oz (85.6 kg). His oral temperature is 98.1 F (36.7 C). His blood pressure is 142/79 (abnormal) and his pulse is 83. His oxygen saturation is 98%. . Weight essentially stable.  Lungs are clear to auscultation bilaterally. Heart has regular rate and rhythm. No palpable cervical, supraclavicular, or axillary adenopathy. Abdomen soft, non-tender, normal bowel sounds. Impression . . . . . . . The patient tolerated radiation. Plan . . . . . . . . . . . . The patient has completed treatment as planned. He will return in one month for follow up.  ________________________________   Blair Promise, PhD, MD  This document serves as a record of services personally performed by Gery Pray, MD. It was created on his behalf by Maryla Morrow, a trained medical scribe. The creation of this record is based on the scribe's personal observations and the provider's statements to them. This document has been checked and approved by the attending provider.

## 2016-09-03 ENCOUNTER — Other Ambulatory Visit (HOSPITAL_BASED_OUTPATIENT_CLINIC_OR_DEPARTMENT_OTHER): Payer: Medicare Other

## 2016-09-03 ENCOUNTER — Ambulatory Visit (HOSPITAL_BASED_OUTPATIENT_CLINIC_OR_DEPARTMENT_OTHER): Payer: Medicare Other

## 2016-09-03 ENCOUNTER — Ambulatory Visit (HOSPITAL_BASED_OUTPATIENT_CLINIC_OR_DEPARTMENT_OTHER): Payer: Medicare Other | Admitting: Hematology & Oncology

## 2016-09-03 VITALS — BP 148/75 | HR 80 | Temp 98.4°F | Resp 20 | Wt 185.0 lb

## 2016-09-03 DIAGNOSIS — T451X5A Adverse effect of antineoplastic and immunosuppressive drugs, initial encounter: Principal | ICD-10-CM

## 2016-09-03 DIAGNOSIS — D649 Anemia, unspecified: Secondary | ICD-10-CM

## 2016-09-03 DIAGNOSIS — Z5111 Encounter for antineoplastic chemotherapy: Secondary | ICD-10-CM | POA: Diagnosis not present

## 2016-09-03 DIAGNOSIS — C679 Malignant neoplasm of bladder, unspecified: Secondary | ICD-10-CM | POA: Diagnosis not present

## 2016-09-03 DIAGNOSIS — C779 Secondary and unspecified malignant neoplasm of lymph node, unspecified: Secondary | ICD-10-CM

## 2016-09-03 DIAGNOSIS — C775 Secondary and unspecified malignant neoplasm of intrapelvic lymph nodes: Principal | ICD-10-CM

## 2016-09-03 DIAGNOSIS — C7951 Secondary malignant neoplasm of bone: Secondary | ICD-10-CM

## 2016-09-03 DIAGNOSIS — D6481 Anemia due to antineoplastic chemotherapy: Secondary | ICD-10-CM

## 2016-09-03 LAB — CMP (CANCER CENTER ONLY)
ALBUMIN: 3.1 g/dL — AB (ref 3.3–5.5)
ALK PHOS: 47 U/L (ref 26–84)
ALT: 15 U/L (ref 10–47)
AST: 36 U/L (ref 11–38)
BILIRUBIN TOTAL: 0.7 mg/dL (ref 0.20–1.60)
BUN, Bld: 16 mg/dL (ref 7–22)
CALCIUM: 9.1 mg/dL (ref 8.0–10.3)
CO2: 28 mEq/L (ref 18–33)
CREATININE: 1.6 mg/dL — AB (ref 0.6–1.2)
Chloride: 105 mEq/L (ref 98–108)
GLUCOSE: 109 mg/dL (ref 73–118)
Potassium: 3.6 mEq/L (ref 3.3–4.7)
SODIUM: 139 meq/L (ref 128–145)
Total Protein: 7.1 g/dL (ref 6.4–8.1)

## 2016-09-03 LAB — CBC WITH DIFFERENTIAL (CANCER CENTER ONLY)
BASO#: 0 10*3/uL (ref 0.0–0.2)
BASO%: 0.3 % (ref 0.0–2.0)
EOS%: 6.1 % (ref 0.0–7.0)
Eosinophils Absolute: 0.2 10*3/uL (ref 0.0–0.5)
HEMATOCRIT: 24.5 % — AB (ref 38.7–49.9)
HEMOGLOBIN: 8.1 g/dL — AB (ref 13.0–17.1)
LYMPH#: 0.7 10*3/uL — AB (ref 0.9–3.3)
LYMPH%: 21 % (ref 14.0–48.0)
MCH: 31.5 pg (ref 28.0–33.4)
MCHC: 33.1 g/dL (ref 32.0–35.9)
MCV: 95 fL (ref 82–98)
MONO#: 1 10*3/uL — ABNORMAL HIGH (ref 0.1–0.9)
MONO%: 31.6 % — AB (ref 0.0–13.0)
NEUT%: 41 % (ref 40.0–80.0)
NEUTROS ABS: 1.3 10*3/uL — AB (ref 1.5–6.5)
Platelets: 99 10*3/uL — ABNORMAL LOW (ref 145–400)
RBC: 2.57 10*6/uL — ABNORMAL LOW (ref 4.20–5.70)
RDW: 29.9 % — ABNORMAL HIGH (ref 11.1–15.7)
WBC: 3.1 10*3/uL — AB (ref 4.0–10.0)

## 2016-09-03 LAB — LACTATE DEHYDROGENASE: LDH: 340 U/L — ABNORMAL HIGH (ref 125–245)

## 2016-09-03 MED ORDER — SODIUM CHLORIDE 0.9 % IV SOLN
Freq: Once | INTRAVENOUS | Status: AC
  Administered 2016-09-03: 13:00:00 via INTRAVENOUS
  Filled 2016-09-03: qty 5

## 2016-09-03 MED ORDER — SODIUM CHLORIDE 0.9 % IV SOLN
60.0000 mg/m2 | Freq: Once | INTRAVENOUS | Status: AC
  Administered 2016-09-03: 123 mg via INTRAVENOUS
  Filled 2016-09-03: qty 100

## 2016-09-03 MED ORDER — POTASSIUM CHLORIDE 2 MEQ/ML IV SOLN
Freq: Once | INTRAVENOUS | Status: AC
  Administered 2016-09-03: 09:00:00 via INTRAVENOUS
  Filled 2016-09-03: qty 10

## 2016-09-03 MED ORDER — HEPARIN SOD (PORK) LOCK FLUSH 100 UNIT/ML IV SOLN
500.0000 [IU] | Freq: Once | INTRAVENOUS | Status: AC | PRN
  Administered 2016-09-03: 500 [IU]
  Filled 2016-09-03: qty 5

## 2016-09-03 MED ORDER — PALONOSETRON HCL INJECTION 0.25 MG/5ML
0.2500 mg | Freq: Once | INTRAVENOUS | Status: AC
  Administered 2016-09-03: 0.25 mg via INTRAVENOUS

## 2016-09-03 MED ORDER — OXYCODONE HCL 15 MG PO TABS: ORAL_TABLET | ORAL | 0 refills | Status: DC

## 2016-09-03 MED ORDER — PALONOSETRON HCL INJECTION 0.25 MG/5ML
INTRAVENOUS | Status: AC
  Filled 2016-09-03: qty 5

## 2016-09-03 MED ORDER — SODIUM CHLORIDE 0.9% FLUSH
10.0000 mL | INTRAVENOUS | Status: DC | PRN
  Administered 2016-09-03: 10 mL
  Filled 2016-09-03: qty 10

## 2016-09-03 MED ORDER — SODIUM CHLORIDE 0.9 % IV SOLN
390.0000 mg/m2 | Freq: Once | INTRAVENOUS | Status: AC
  Administered 2016-09-03: 800 mg via INTRAVENOUS
  Filled 2016-09-03: qty 12

## 2016-09-03 MED FILL — oxyCODONE HCL 15 MG TABS: 15 | 15 days supply | Qty: 90 | Fill #0

## 2016-09-03 NOTE — Progress Notes (Signed)
Hematology and Oncology Follow Up Visit  CALLAGHAN FOERST [id number] 22-Jun-1946 70 y.o. 09/03/2016   Principle Diagnosis:  Metastatic bladder cancer - bone and lymph node metastasis - March  Current Therapy:   Tecentriq s/p cycle #4- Progression CDDP/Alimta - cycle #4 Zometa 4 mg IV every 3 weeks Palliative radiation therapy to the right pelvis    Interim History:  Mr. Rappa is here today for a follow-up. Overall, I that he is tolerating his chemotherapy pretty well.  He's having some more pain in the right hip area. He has had radiation therapy to this area. He finished up probably about a week ago. Hopefully, the radiation will start working.   He is on a Duragesic patch. I think he is on 75 g. He is on oral oxycodone. It sounds like he's having more breakthrough pain. I will increase the dose of oxycodone a little bit.  Otherwise, he is doing okay. Had a good Thanksgiving. He ate well. He had no nausea or vomiting.  He has a little bit more swelling in the legs. I think this probably is from the anemia.   Overall, his has a performance status of ECOG 1.    Medications:    Medication List       Accurate as of 09/03/16  1:49 PM. Always use your most recent med list.          dexamethasone 4 MG tablet Commonly known as:  DECADRON Take 1 tab two times a day the day before Alimta chemo. Take 2 tabs the day after chemo, then take 2 tabs two times a day for 2 days.   fentaNYL 75 MCG/HR Commonly known as:  DURAGESIC - dosed mcg/hr Place 1 patch (75 mcg total) onto the skin every other day.   folic acid 1 MG tablet Commonly known as:  FOLVITE Take 1 tablet (1 mg total) by mouth daily. Start 5-7 days before Alimta chemotherapy. Continue until 21 days after Alimta completed.   hydrochlorothiazide 12.5 MG capsule Commonly known as:  MICROZIDE TAKE 1 CAPSULE BY MOUTH EVERY MORNING   lidocaine-prilocaine cream Commonly known as:  EMLA Apply to affected area once     ondansetron 8 MG tablet Commonly known as:  ZOFRAN Take 1 tablet (8 mg total) by mouth 2 (two) times daily as needed (Nausea or vomiting). Start if needed on the third day after chemotherapy.   oxyCODONE-acetaminophen 10-325 MG tablet Commonly known as:  PERCOCET Take 1 tablet by mouth every 6 (six) hours as needed for pain.   prochlorperazine 10 MG tablet Commonly known as:  COMPAZINE Take 1 tablet (10 mg total) by mouth every 6 (six) hours as needed (Nausea or vomiting).   tamsulosin 0.4 MG Caps capsule Commonly known as:  FLOMAX TAKE 2 CAPSULES BY MOUTH EVERY DAY AS DIRECTED       Allergies: No Known Allergies  Past Medical History, Surgical history, Social history, and Family History were reviewed and updated.  Review of Systems: All other 10 point review of systems is negative.   Physical Exam:  weight is 185 lb (83.9 kg). His oral temperature is 98.4 F (36.9 C). His blood pressure is 148/75 (abnormal) and his pulse is 80. His respiration is 20.   Wt Readings from Last 3 Encounters:  09/03/16 185 lb (83.9 kg)  09/02/16 188 lb 12.8 oz (85.6 kg)  08/26/16 183 lb 3.2 oz (83.1 kg)    Well-developed and well-nourished African [place] male in no obvious distress. Head and  neck exam shows no ocular or oral lesions. He has no palpable cervical or supraclavicular lymph nodes. Lungs are clear. He has no rales, wheezes or rhonchi. Cardiac exam regular rate and rhythm with no murmurs, rubs or bruits. Abdomen is soft. Has good bowel sounds. There is no fluid wave. There is no palpable liver or spleen tip. Back exam shows some tenderness over the lateral right pelvis. No swelling is noted. No warmth is noted. Extremity shows no clubbing, cyanosis or edema. Skin exam shows no rashes, ecchymoses or petechia. Neurological exam shows no focal neurological deficits.  Lab Results  Component Value Date   WBC 3.1 (L) 09/03/2016   HGB 8.1 (L) 09/03/2016   HCT 24.5 (L) 09/03/2016   MCV 95  09/03/2016   PLT 99 (L) 09/03/2016   Lab Results  Component Value Date   FERRITIN 122 01/28/2016   IRON 51 01/28/2016   TIBC 348 01/28/2016   UIBC 297 01/28/2016   IRONPCTSAT 15 (L) 01/28/2016   Lab Results  Component Value Date   RBC 2.57 (L) 09/03/2016   No results found for: KPAFRELGTCHN, LAMBDASER, KAPLAMBRATIO No results found for: IGGSERUM, IGA, IGMSERUM No results found for: Ronnald Ramp, A1GS, A2GS, Tillman Sers, SPEI   Chemistry      Component Value Date/Time   NA 139 09/03/2016 0745   K 3.6 09/03/2016 0745   CL 105 09/03/2016 0745   CO2 28 09/03/2016 0745   BUN 16 09/03/2016 0745   CREATININE 1.6 (H) 09/03/2016 0745      Component Value Date/Time   CALCIUM 9.1 09/03/2016 0745   ALKPHOS 47 09/03/2016 0745   AST 36 09/03/2016 0745   ALT 15 09/03/2016 0745   BILITOT 0.70 09/03/2016 0745     Impression and Plan: Mr. Rubi is very pleasant 70 yo African American male with poorly differentiated metastatic bladder cancer. Again, he has progressed despite immunotherapy.  Thankfully, the cancer is responding from the last PET scan.  We will continue him on therapy.  With his anemia, I will check his erythropoietin level. I suspect that he probably would benefit from Aranesp.  We'll plan to get him back in 3 weeks. Hopefully, we will not have to transfuse him.   [name], MD 12/6/20171:49 PM

## 2016-09-03 NOTE — Patient Instructions (Signed)
[facility] Discharge Instructions for Patients Receiving Chemotherapy  Today you received the following chemotherapy agents Cisplatin, Alimta  To help prevent nausea and vomiting after your treatment, we encourage you to take your nausea medication    If you develop nausea and vomiting that is not controlled by your nausea medication, call the clinic.   BELOW ARE SYMPTOMS THAT SHOULD BE REPORTED IMMEDIATELY:  *FEVER GREATER THAN 100.5 F  *CHILLS WITH OR WITHOUT FEVER  NAUSEA AND VOMITING THAT IS NOT CONTROLLED WITH YOUR NAUSEA MEDICATION  *UNUSUAL SHORTNESS OF BREATH  *UNUSUAL BRUISING OR BLEEDING  TENDERNESS IN MOUTH AND THROAT WITH OR WITHOUT PRESENCE OF ULCERS  *URINARY PROBLEMS  *BOWEL PROBLEMS  UNUSUAL RASH Items with * indicate a potential emergency and should be followed up as soon as possible.  Feel free to call the clinic you have any questions or concerns. The clinic phone number is [phone].  Please show the [facility] at check-in to the Emergency Department and triage nurse.

## 2016-09-03 NOTE — Progress Notes (Signed)
Reviewed pt labs with Dr. [name] (CBC and CMET). Dr. [name] stated ok to treat despite platelets 99, ANC 1.3 and creatinine 1.6  1225: OK to proceed with treatment with urine output of 225 per Dr. [name] at this time.

## 2016-09-13 ENCOUNTER — Other Ambulatory Visit: Payer: Self-pay | Admitting: Hematology & Oncology

## 2016-09-17 ENCOUNTER — Ambulatory Visit (INDEPENDENT_AMBULATORY_CARE_PROVIDER_SITE_OTHER): Payer: Medicare Other

## 2016-09-17 ENCOUNTER — Other Ambulatory Visit (HOSPITAL_BASED_OUTPATIENT_CLINIC_OR_DEPARTMENT_OTHER): Payer: Medicare Other

## 2016-09-17 ENCOUNTER — Other Ambulatory Visit: Payer: Self-pay | Admitting: *Deleted

## 2016-09-17 ENCOUNTER — Other Ambulatory Visit: Payer: Self-pay | Admitting: Family

## 2016-09-17 ENCOUNTER — Ambulatory Visit (HOSPITAL_BASED_OUTPATIENT_CLINIC_OR_DEPARTMENT_OTHER): Payer: Medicare Other

## 2016-09-17 ENCOUNTER — Ambulatory Visit (HOSPITAL_BASED_OUTPATIENT_CLINIC_OR_DEPARTMENT_OTHER): Payer: Medicare Other | Admitting: Hematology & Oncology

## 2016-09-17 ENCOUNTER — Telehealth: Payer: Self-pay | Admitting: *Deleted

## 2016-09-17 ENCOUNTER — Ambulatory Visit (HOSPITAL_COMMUNITY)
Admission: RE | Admit: 2016-09-17 | Discharge: 2016-09-17 | Disposition: A | Discharge status: Home / Self Care | Payer: Medicare Other | Source: Ambulatory Visit | Attending: Hematology & Oncology | Admitting: Hematology & Oncology

## 2016-09-17 ENCOUNTER — Ambulatory Visit (INDEPENDENT_AMBULATORY_CARE_PROVIDER_SITE_OTHER): Payer: Medicare Other | Admitting: Family Medicine

## 2016-09-17 VITALS — BP 146/83 | HR 112 | Temp 99.1°F

## 2016-09-17 VITALS — BP 154/82 | HR 100 | Temp 98.8°F | Resp 16 | Ht 73.0 in | Wt 175.0 lb

## 2016-09-17 DIAGNOSIS — D696 Thrombocytopenia, unspecified: Secondary | ICD-10-CM

## 2016-09-17 DIAGNOSIS — C775 Secondary and unspecified malignant neoplasm of intrapelvic lymph nodes: Principal | ICD-10-CM

## 2016-09-17 DIAGNOSIS — D61810 Antineoplastic chemotherapy induced pancytopenia: Secondary | ICD-10-CM | POA: Diagnosis not present

## 2016-09-17 DIAGNOSIS — C679 Malignant neoplasm of bladder, unspecified: Secondary | ICD-10-CM

## 2016-09-17 DIAGNOSIS — C7951 Secondary malignant neoplasm of bone: Secondary | ICD-10-CM | POA: Diagnosis not present

## 2016-09-17 DIAGNOSIS — T451X5A Adverse effect of antineoplastic and immunosuppressive drugs, initial encounter: Secondary | ICD-10-CM | POA: Diagnosis not present

## 2016-09-17 DIAGNOSIS — M79642 Pain in left hand: Secondary | ICD-10-CM

## 2016-09-17 DIAGNOSIS — M19042 Primary osteoarthritis, left hand: Secondary | ICD-10-CM | POA: Diagnosis not present

## 2016-09-17 DIAGNOSIS — M79645 Pain in left finger(s): Secondary | ICD-10-CM

## 2016-09-17 DIAGNOSIS — M7989 Other specified soft tissue disorders: Secondary | ICD-10-CM | POA: Diagnosis not present

## 2016-09-17 LAB — CBC WITH DIFFERENTIAL (CANCER CENTER ONLY)
BASO#: 0 10*3/uL (ref 0.0–0.2)
BASO%: 0 % (ref 0.0–2.0)
EOS%: 0.8 % (ref 0.0–7.0)
Eosinophils Absolute: 0 10*3/uL (ref 0.0–0.5)
HCT: 20.6 % — ABNORMAL LOW (ref 38.7–49.9)
HEMOGLOBIN: 7 g/dL — AB (ref 13.0–17.1)
LYMPH#: 0.6 10*3/uL — ABNORMAL LOW (ref 0.9–3.3)
LYMPH%: 47.4 % (ref 14.0–48.0)
MCH: 32.7 pg (ref 28.0–33.4)
MCHC: 34 g/dL (ref 32.0–35.9)
MCV: 96 fL (ref 82–98)
MONO#: 0.4 10*3/uL (ref 0.1–0.9)
MONO%: 31.6 % — AB (ref 0.0–13.0)
NEUT%: 20.2 % — ABNORMAL LOW (ref 40.0–80.0)
NEUTROS ABS: 0.3 10*3/uL — AB (ref 1.5–6.5)
RBC: 2.14 10*6/uL — AB (ref 4.20–5.70)
RDW: 21.6 % — ABNORMAL HIGH (ref 11.1–15.7)
WBC: 1.3 10*3/uL — AB (ref 4.0–10.0)

## 2016-09-17 LAB — URIC ACID: URIC ACID, SERUM: 6.3 mg/dL (ref 2.6–7.4)

## 2016-09-17 LAB — CMP (CANCER CENTER ONLY)
ALK PHOS: 42 U/L (ref 26–84)
ALT: 16 U/L (ref 10–47)
AST: 29 U/L (ref 11–38)
Albumin: 3.3 g/dL (ref 3.3–5.5)
BUN: 23 mg/dL — AB (ref 7–22)
CO2: 27 mEq/L (ref 18–33)
CREATININE: 1.6 mg/dL — AB (ref 0.6–1.2)
Calcium: 7 mg/dL — ABNORMAL LOW (ref 8.0–10.3)
Chloride: 103 mEq/L (ref 98–108)
Glucose, Bld: 108 mg/dL (ref 73–118)
Potassium: 3.2 mEq/L — ABNORMAL LOW (ref 3.3–4.7)
SODIUM: 143 meq/L (ref 128–145)
TOTAL PROTEIN: 7.6 g/dL (ref 6.4–8.1)
Total Bilirubin: 1.2 mg/dl (ref 0.20–1.60)

## 2016-09-17 LAB — POCT CBC
Granulocyte percent: 27.7 %G — AB (ref 37–80)
HCT, POC: 20.8 % — AB (ref 43.5–53.7)
HEMOGLOBIN: 7.3 g/dL — AB (ref 14.1–18.1)
Lymph, poc: 0.6 (ref 0.6–3.4)
MCH: 32.7 pg — AB (ref 27–31.2)
MCHC: 35 g/dL (ref 31.8–35.4)
MCV: 93.5 fL (ref 80–97)
MID (cbc): 0.3 (ref 0–0.9)
MPV: 6.2 fL (ref 0–99.8)
POC Granulocyte: 0.3 — AB (ref 2–6.9)
POC LYMPH PERCENT: 49 %L (ref 10–50)
POC MID %: 23.3 % — AB (ref 0–12)
Platelet Count, POC: 6 10*3/uL — AB (ref 142–424)
RBC: 2.23 M/uL — AB (ref 4.69–6.13)
RDW, POC: 24.7 %
WBC: 1.2 10*3/uL — AB (ref 4.6–10.2)

## 2016-09-17 MED ORDER — SODIUM CHLORIDE 0.9 % IV SOLN
40.0000 mg | Freq: Every day | INTRAVENOUS | Status: DC
  Administered 2016-09-17: 40 mg via INTRAVENOUS
  Filled 2016-09-17: qty 4

## 2016-09-17 MED ORDER — SODIUM CHLORIDE 0.9 % IV SOLN
Freq: Once | INTRAVENOUS | Status: AC
  Administered 2016-09-17: 16:00:00 via INTRAVENOUS

## 2016-09-17 MED ORDER — SODIUM CHLORIDE 0.9 % IJ SOLN
10.0000 mL | INTRAMUSCULAR | Status: DC | PRN
  Administered 2016-09-17: 10 mL
  Filled 2016-09-17: qty 10

## 2016-09-17 MED ORDER — HEPARIN SOD (PORK) LOCK FLUSH 100 UNIT/ML IV SOLN
500.0000 [IU] | Freq: Once | INTRAVENOUS | Status: AC | PRN
  Administered 2016-09-17: 500 [IU]
  Filled 2016-09-17: qty 5

## 2016-09-17 NOTE — Telephone Encounter (Signed)
Critical Value Plt <6 and ANC 0.3 Dr [name] notified. Orders will be placed

## 2016-09-17 NOTE — Patient Instructions (Addendum)
  Your hand swelling may be due to inflammation from gout or other inflammatory arthritis, but more concerning are your low platelets and low blood counts. I spoke to your hematologist and he would like to see you immediately today at his office ([place], [place]).   Go to his office a as soon as leaving our office. I did provide a brace to wear on the hand if that helps with the discomfort, and you can take the oxycodone and fentanyl patch that you have been prescribed for your pain as needed. Depending on workup for your platelets, you can follow-up with [place] in the next few days if the hand swelling is not improving. I did check a gout test on your blood work today.    IF you received an x-ray today, you will receive an invoice from [facility]. Please contact [facility] at [phone] with questions or concerns regarding your invoice.   IF you received labwork today, you will receive an invoice from [facility]. Please contact [facility] at [phone] with questions or concerns regarding your invoice.   Our billing staff will not be able to assist you with questions regarding bills from these companies.  You will be contacted with the lab results as soon as they are available. The fastest way to get your results is to activate your My Chart account. Instructions are located on the last page of this paperwork. If you have not heard from [place] regarding the results in 2 weeks, please contact this office.

## 2016-09-17 NOTE — Progress Notes (Signed)
Subjective:  By signing my name below, I, Raven Small, attest that this documentation has been prepared under the direction and in the presence of [name], MD.  Electronically Signed: Thea Alken, ED Scribe. 09/17/2016. 9:44 AM.   Patient ID: Dustin Jordan, male    DOB: [birth year], 70 y.o.   MRN: [id number]  HPI   Chief Complaint  Patient presents with  . hand swelling    left/ x 1 day    HPI Comments: Dustin Jordan is a 70 y.o. male with hx of HTN and bladder cancer with metastasis to lymph nodes who presents to the [facility] complaining of left hand swelling onset 1 day. Pt denies known injury or insect bite. Reports drinking a lot of fruit juices 1 week ago for constipation, which he suspects possibly initiated swelling. He reports hand tightness with soreness at the base of left thumb. No hx of gout.  Pt tried aspirin once, without relief.  He denies alcohol use. He denies fever and overall feeling ill.   Pt last had chemotherapy 2 weeks ago. No new medication. He is followed by Dr. Arelia Sneddon. Pt takes oxycodone as needed. He also uses fentanyl patch which he changes every 3-4 days.  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   Hx of pancytopenia with WBC 3.1 HBG 8.1  platelets of 99 2 weeks ago, followed by Dr. [name], he denied hematuria bruising new rash bleeding of gum or blood in stool/dark tarry stools.   Patient Active Problem List   Diagnosis Date Noted  . Secondary malignant neoplasm of bone ([place]) 04/23/2016  . Bladder cancer metastasized to intrapelvic lymph nodes ([place]) 02/02/2015  . Lymphadenopathy, pelvic 01/20/2015  . Partial small bowel obstruction 01/19/2015  . Thyroid mass 10/04/2014  . Clot retention of urine 01/01/2014  . HTN (hypertension) 04/20/2013  . Hematuria 04/20/2013   Past Medical History:  Diagnosis Date  . Bladder cancer ([place])   . Bladder cancer metastasized to intrapelvic lymph nodes ([place]) 02/02/2015  . Frequency of  urination   . Hematuria   . Hypertension   . Swelling of right knee joint    s/p arthroscopy 06-08-2013  . White coat hypertension    Past Surgical History:  Procedure Laterality Date  . CYSTOSCOPY N/A 02/17/2014   Procedure: CYSTOSCOPY WITH INDOCYANINE GREEN DYE;  Surgeon: Alexis Frock, MD;  Location: [facility];  Service: Urology;  Laterality: N/A;  . CYSTOSCOPY W/ RETROGRADES Bilateral 06/22/2013   Procedure: CYSTOSCOPY WITH RETROGRADE PYELOGRAM;  Surgeon: Alexis Frock, MD;  Location[facility];  Service: Urology;  Laterality: Bilateral;  . CYSTOSCOPY WITH RETROGRADE PYELOGRAM, URETEROSCOPY AND STENT PLACEMENT Bilateral 01/02/2014   Procedure: CYSTOSCOPY WITH RETROGRADE PYELOGRAM BILATERAL URETERS  AND TRANSURETHRAL RESECTION OF A BLADDER TUMOR WITH MULTIPLE BX;  Surgeon: Alexis Frock, MD;  Location: [facility];  Service: Urology;  Laterality: Bilateral;  . KNEE ARTHROSCOPY Right 06-08-2013  . LYMPHADENECTOMY Bilateral 02/17/2014   Procedure: BILATERAL LYMPH NODE DISSECTION;  Surgeon: Alexis Frock, MD;  Location: [facility];  Service: Urology;  Laterality: Bilateral;  . ROBOT ASSISTED LAPAROSCOPIC COMPLETE CYSTECT ILEAL CONDUIT N/A 02/17/2014   Procedure: ROBOTIC ASSISTED LAPAROSCOPIC PARTIAL CYSTECTOMY;  Surgeon: Alexis Frock, MD;  Location: [facility];  Service: Urology;  Laterality: N/A;  . TRANSURETHRAL RESECTION OF BLADDER TUMOR WITH GYRUS (TURBT-GYRUS) N/A 06/22/2013   Procedure: TRANSURETHRAL RESECTION OF BLADDER TUMOR WITH GYRUS (TURBT-GYRUS);  Surgeon: Alexis Frock, MD;  Location[facility];  Service: Urology;  Laterality: N/A;   No Known Allergies Prior to Admission medications   Medication Sig Start Date End Date Taking? Authorizing Provider  fentaNYL (DURAGESIC - DOSED MCG/HR) 75 MCG/HR Place 1 patch (75 mcg total) onto the skin every other day. 08/27/16  Yes Eliezer Bottom, NP  hydrochlorothiazide (MICROZIDE) 12.5 MG capsule TAKE 1 CAPSULE BY  MOUTH EVERY MORNING 08/31/16  Yes Wendie Agreste, MD  lidocaine-prilocaine (EMLA) cream Apply to affected area once 06/11/16  Yes [name], MD  oxyCODONE (ROXICODONE) 15 MG immediate release tablet Take 1 tablet , if needed, every 4 hrs for pain. 09/03/16  Yes [name], MD  tamsulosin (FLOMAX) 0.4 MG CAPS capsule TAKE 2 CAPSULES BY MOUTH EVERY DAY AS DIRECTED 09/15/16  Yes [name], MD  dexamethasone (DECADRON) 4 MG tablet Take 1 tab two times a day the day before Alimta chemo. Take 2 tabs the day after chemo, then take 2 tabs two times a day for 2 days. Patient not taking: Reported on 09/17/2016 06/11/16   [name], MD  folic acid (FOLVITE) 1 MG tablet Take 1 tablet (1 mg total) by mouth daily. Start 5-7 days before Alimta chemotherapy. Continue until 21 days after Alimta completed. Patient not taking: Reported on 09/17/2016 06/11/16   [name], MD  ondansetron (ZOFRAN) 8 MG tablet Take 1 tablet (8 mg total) by mouth 2 (two) times daily as needed (Nausea or vomiting). Start if needed on the third day after chemotherapy. Patient not taking: Reported on 09/17/2016 06/11/16   [name], MD  prochlorperazine (COMPAZINE) 10 MG tablet Take 1 tablet (10 mg total) by mouth every 6 (six) hours as needed (Nausea or vomiting). Patient not taking: Reported on 09/17/2016 06/11/16   [name], MD   Social History   Social History  . Marital status: Single    Spouse name: N/A  . Number of children: 1  . Years of education: N/A   Occupational History  . Driver      part time   Social History Main Topics  . Smoking status: Former Smoker    Packs/day: 1.00    Years: 36.00    Types: Cigarettes    Start date: 03/06/1983    Quit date: 06/15/2010  . Smokeless tobacco: Never Used     Comment: quit 5 years ago  . Alcohol use No  . Drug use: No  . Sexual activity: Not on file   Other Topics Concern  . Not on file   Social History Narrative   Divorced.    Exercise: Yes   Review of Systems  Constitutional: Negative for chills and fever.  Musculoskeletal: Positive for myalgias.    Objective:   Physical Exam  Constitutional: He is oriented to person, place, and time. He appears well-developed and well-nourished. No distress.  HENT:  Head: Normocephalic and atraumatic.  Eyes: Conjunctivae and EOM are normal.  Neck: Neck supple.  Cardiovascular: Normal rate.   Pulmonary/Chest: Effort normal.  Musculoskeletal: Normal range of motion.  Left axillary- no pain.  Upper arm- no rash., no nodules. No apparent veins swelling or cords.  Forearm and elbow non tender. Wrist non tender.guarding with extension of wrist.   Discomfort but able to flex wrist. Discomfort with radial and ulnar deviation. Diffuse soft tissue swelling extending from left wrist to finger tips. Cap refill <1 sec on wrist. Tender along proximal phalanx of thumb. appreciable swelling at thumb MCP. minimal tenderness along 2nd MCP at  dorsal radial wrist. but primarily over thumb MC. Able to flex and extend without difficult but guarded ROM.    Lymphadenopathy:    He has no axillary adenopathy.  Neurological: He is alert and oriented to person, place, and time.  Skin: Skin is warm and dry.  Psychiatric: He has a normal mood and affect. His behavior is normal.  Nursing note and vitals reviewed.  Vitals:   09/17/16 0935  BP: (!) 154/82  Pulse: 100  Resp: 16  Temp: 98.8 F (37.1 C)  TempSrc: Oral  SpO2: 99%  Weight: 175 lb (79.4 kg)  Height: 6\' 1"  (1.854 m)   Results for orders placed or performed in visit on 09/17/16  POCT CBC  Result Value Ref Range   WBC 1.2 (A) 4.6 - 10.2 K/uL   Lymph, poc 0.6 0.6 - 3.4   POC LYMPH PERCENT 49.0 10 - 50 %L   MID (cbc) 0.3 0 - 0.9   POC MID % 23.3 (A) 0 - 12 %M   POC Granulocyte 0.3 (A) 2 - 6.9   Granulocyte percent 27.7 (A) 37 - 80 %G   RBC 2.23 (A) 4.69 - 6.13 M/uL   Hemoglobin 7.3 (A) 14.1 - 18.1 g/dL   HCT, POC 20.8 (A) 43.5 -  53.7 %   MCV 93.5 80 - 97 fL   MCH, POC 32.7 (A) 27 - 31.2 pg   MCHC 35.0 31.8 - 35.4 g/dL   RDW, POC 24.7 %   Platelet Count, POC 6.0 (A) 142 - 424 K/uL   MPV 6.2 0 - 99.8 fL   CBC was repeated with similar results except platelets 5.   Dg Hand Complete Left  Result Date: 09/17/2016 CLINICAL DATA:  Soft tissue swelling EXAM: LEFT HAND - COMPLETE 3+ VIEW COMPARISON:  None. FINDINGS: Frontal, oblique, and lateral views obtained. There is no fracture or dislocation. There is extensive osteoarthritic change in the first carpal-metacarpal joint remodeling of the trapezium bone. There is slight narrowing of the scaphotrapezial joint. No erosive change. No bony destruction. Other joint spaces appear unremarkable. 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 No fracture or dislocation. Electronically Signed   By: Lowella Grip III M.D.   On: 09/17/2016 10:35    Assessment & Plan:  Dustin Jordan is a 70 y.o. male Left hand pain - Plan: POCT CBC, DG Hand Complete Left, Thumb spica Swelling of left hand - Plan: POCT CBC, Uric Acid, DG Hand Complete Left, Thumb spica Pain of left thumb - Plan: POCT CBC, Thumb spica  - No known injury, underlying degenerative changes at first [facility], but no erosive arthropathy appreciated. Possible inflammatory arthropathy such as gout, uric acid level pending. Based on marked thrombocytopenia as below, will avoid NSAIDs at this time, consider prednisone at the discretion of his hematologist, but continue previous pain med regimen and splint placed for now. RTC precautions if persistent, and further treatment to be determined after treatment/evaluation of thrombocytopenia.  Antineoplastic chemotherapy induced pancytopenia (HCC) Thrombocytopenia (HCC)  - Asymptomatic. Discussed with his hematologist. Will be seen today at their office as soon as leaving our office. Understanding expressed.  No orders of the defined types were  placed in this encounter.  Patient Instructions    Your hand swelling may be due to inflammation from gout or other inflammatory arthritis, but more concerning are your low platelets and low blood counts. I spoke to your hematologist and he would like to see you immediately today at  his office ([place], [place]).   Go to his office a as soon as leaving our office. I did provide a brace to wear on the hand if that helps with the discomfort, and you can take the oxycodone and fentanyl patch that you have been prescribed for your pain as needed. Depending on workup for your platelets, you can follow-up with [place] in the next few days if the hand swelling is not improving. I did check a gout test on your blood work today.    IF you received an x-ray today, you will receive an invoice from [facility]. Please contact [facility] at [phone] with questions or concerns regarding your invoice.   IF you received labwork today, you will receive an invoice from [place]. Please contact [facility] at [phone] with questions or concerns regarding your invoice.   Our billing staff will not be able to assist you with questions regarding bills from these companies.  You will be contacted with the lab results as soon as they are available. The fastest way to get your results is to activate your My Chart account. Instructions are located on the last page of this paperwork. If you have not heard from [place] regarding the results in 2 weeks, please contact this office.        I personally performed the services described in this documentation, which was scribed in my presence. The recorded information has been reviewed and considered, and addended by me as needed.   Signed,   [name], MD [facility].  09/17/16 11:09 AM

## 2016-09-17 NOTE — Progress Notes (Signed)
Hematology and Oncology Follow Up Visit  Dustin Jordan [id number] 01-08-1946 70 y.o. 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 I was called by urgent care. He went there today. Had a swollen left hand. This swelled up on him today. He denied any kind of trauma. He had no arm swelling. He had no warmth. There was not much pain.  His doctor at urgent care thought that he may have had gout. However he call when he saw that his platelet count was less than 5000.  He's never had a problem with thrombocytopenia from treatment before. He is on splattered him/Alimta. He last got treatment 14 days ago. He's had no bleeding. He's had no petechia. He's had no ecchymoses.  His been no change in medications. He's had no fever. He's had no headache. He's had no visual changes.   Overall, his has a performance status of ECOG 1.    Medications:  Allergies as of 09/17/2016   No Known Allergies     Medication List       Accurate as of 09/17/16  5:56 PM. Always use your most recent med list.          dexamethasone 4 MG tablet Commonly known as:  DECADRON Take 1 tab two times a day the day before Alimta chemo. Take 2 tabs the day after chemo, then take 2 tabs two times a day for 2 days.   fentaNYL 75 MCG/HR Commonly known as:  DURAGESIC - dosed mcg/hr Place 1 patch (75 mcg total) onto the skin every other day.   folic acid 1 MG tablet Commonly known as:  FOLVITE Take 1 tablet (1 mg total) by mouth daily. Start 5-7 days before Alimta chemotherapy. Continue until 21 days after Alimta completed.   hydrochlorothiazide 12.5 MG capsule Commonly known as:  MICROZIDE TAKE 1 CAPSULE BY MOUTH EVERY  MORNING   lidocaine-prilocaine cream Commonly known as:  EMLA Apply to affected area once   ondansetron 8 MG tablet Commonly known as:  ZOFRAN Take 1 tablet (8 mg total) by mouth 2 (two) times daily as needed (Nausea or vomiting). Start if needed on the third day after chemotherapy.   oxyCODONE 15 MG immediate release tablet Commonly known as:  ROXICODONE Take 1 tablet , if needed, every 4 hrs for pain.   prochlorperazine 10 MG tablet Commonly known as:  COMPAZINE Take 1 tablet (10 mg total) by mouth every 6 (six) hours as needed (Nausea or vomiting).   tamsulosin 0.4 MG Caps capsule Commonly known as:  FLOMAX TAKE 2 CAPSULES BY MOUTH EVERY DAY AS DIRECTED       Allergies: No Known Allergies  Past Medical History, Surgical history, Social history, and Family History were reviewed and updated.  Review of Systems: All other 10 point review of systems is negative.   Physical Exam:  vitals were not taken for this visit.  Wt Readings from Last 3 Encounters:  09/17/16 175 lb (79.4 kg)  09/03/16 185 lb (83.9 kg)  09/02/16 188 lb 12.8 oz (85.6 kg)    Well-developed and well-nourished African [place] male in no obvious distress. Head and neck exam shows no ocular or oral lesions. He has  no palpable cervical or supraclavicular lymph nodes. Lungs are clear. He has no rales, wheezes or rhonchi. Cardiac exam regular rate and rhythm with no murmurs, rubs or bruits. Abdomen is soft. Has good bowel sounds. There is no fluid wave. There is no palpable liver or spleen tip. Back exam shows some tenderness over the lateral right pelvis. No swelling is noted. No warmth is noted. Extremity shows no clubbing, cyanosis or edema. Skin exam shows no rashes, ecchymoses or petechia. Neurological exam shows no focal neurological deficits.  Lab Results  Component Value Date   WBC 1.3 (L) 09/17/2016   HGB 7.0 (L) 09/17/2016   HCT 20.6 (L) 09/17/2016   MCV 96 09/17/2016   PLT <6 (LL) 09/17/2016    Lab Results  Component Value Date   FERRITIN 122 01/28/2016   IRON 51 01/28/2016   TIBC 348 01/28/2016   UIBC 297 01/28/2016   IRONPCTSAT 15 (L) 01/28/2016   Lab Results  Component Value Date   RBC 2.14 (L) 09/17/2016   No results found for: KPAFRELGTCHN, LAMBDASER, KAPLAMBRATIO No results found for: IGGSERUM, IGA, IGMSERUM No results found for: Odetta Pink, SPEI   Chemistry      Component Value Date/Time   NA 143 09/17/2016 1502   K 3.2 (L) 09/17/2016 1502   CL 103 09/17/2016 1502   CO2 27 09/17/2016 1502   BUN 23 (H) 09/17/2016 1502   CREATININE 1.6 (H) 09/17/2016 1502      Component Value Date/Time   CALCIUM 7.0 (L) 09/17/2016 1502   ALKPHOS 42 09/17/2016 1502   AST 29 09/17/2016 1502   ALT 16 09/17/2016 1502   BILITOT 1.20 09/17/2016 1502     Impression and Plan: Dustin Jordan is very pleasant 70 yo African American male with poorly differentiated metastatic bladder cancer. Again, he has progressed despite immunotherapy.  He had an x-ray done which did not show any fracture.  I'm not sure what might be going on. I suppose a might be gout.  I will give a dose of IV Decadron. This should help.   I did look at his blood under the microscope. I do not see anything that looked unusual. I do not see anything that looked like infiltrative disease from his malignancy.   He definitely needs platelets. We will give him platelets tomorrow.   I suppose he may have ITP. Again I think this would be a little unusual.   We will be seeing him tomorrow. I will probably check another CBC on him.   We spent about 45 minutes with him. This was totally unexpected. I want to be able to be somewhat aggressive here since his quality of life is still quite good.    [name], MD 12/20/20175:56 PM

## 2016-09-17 NOTE — Progress Notes (Signed)
Platelet order entered per Dr. [name] request.

## 2016-09-18 ENCOUNTER — Ambulatory Visit (HOSPITAL_BASED_OUTPATIENT_CLINIC_OR_DEPARTMENT_OTHER): Payer: Medicare Other

## 2016-09-18 DIAGNOSIS — D696 Thrombocytopenia, unspecified: Secondary | ICD-10-CM | POA: Diagnosis not present

## 2016-09-18 DIAGNOSIS — D61810 Antineoplastic chemotherapy induced pancytopenia: Secondary | ICD-10-CM

## 2016-09-18 LAB — URIC ACID: URIC ACID: 6.6 mg/dL (ref 3.7–8.6)

## 2016-09-18 MED ORDER — DIPHENHYDRAMINE HCL 25 MG PO CAPS
25.0000 mg | ORAL_CAPSULE | Freq: Once | ORAL | Status: AC
  Administered 2016-09-18: 25 mg via ORAL

## 2016-09-18 MED ORDER — ACETAMINOPHEN 325 MG PO TABS
650.0000 mg | ORAL_TABLET | Freq: Once | ORAL | Status: AC
  Administered 2016-09-18: 650 mg via ORAL

## 2016-09-18 MED ORDER — SODIUM CHLORIDE 0.9 % IV SOLN
250.0000 mL | Freq: Once | INTRAVENOUS | Status: AC
  Administered 2016-09-18: 250 mL via INTRAVENOUS

## 2016-09-18 MED ORDER — HEPARIN SOD (PORK) LOCK FLUSH 100 UNIT/ML IV SOLN
500.0000 [IU] | Freq: Every day | INTRAVENOUS | Status: DC | PRN
  Filled 2016-09-18: qty 5

## 2016-09-18 MED ORDER — DIPHENHYDRAMINE HCL 25 MG PO CAPS
ORAL_CAPSULE | ORAL | Status: AC
  Filled 2016-09-18: qty 1

## 2016-09-18 MED ORDER — ACETAMINOPHEN 325 MG PO TABS
ORAL_TABLET | ORAL | Status: AC
  Filled 2016-09-18: qty 2

## 2016-09-18 MED ORDER — SODIUM CHLORIDE 0.9% FLUSH
10.0000 mL | INTRAVENOUS | Status: DC | PRN
Start: 2016-09-18 — End: 2016-09-18
  Filled 2016-09-18: qty 10

## 2016-09-18 NOTE — Patient Instructions (Signed)

## 2016-09-18 NOTE — Addendum Note (Signed)
Addended by: [name] on: 09/18/2016 08:33 AM   Modules accepted: Orders, SmartSet

## 2016-09-19 LAB — PREPARE PLATELET PHERESIS: Unit division: 0

## 2016-09-23 ENCOUNTER — Telehealth: Payer: Self-pay

## 2016-09-23 ENCOUNTER — Other Ambulatory Visit: Payer: Self-pay | Admitting: Family

## 2016-09-23 ENCOUNTER — Other Ambulatory Visit: Payer: Self-pay | Admitting: *Deleted

## 2016-09-23 ENCOUNTER — Telehealth: Payer: Self-pay | Admitting: *Deleted

## 2016-09-23 DIAGNOSIS — C679 Malignant neoplasm of bladder, unspecified: Secondary | ICD-10-CM

## 2016-09-23 DIAGNOSIS — M25461 Effusion, right knee: Secondary | ICD-10-CM

## 2016-09-23 DIAGNOSIS — C775 Secondary and unspecified malignant neoplasm of intrapelvic lymph nodes: Principal | ICD-10-CM

## 2016-09-23 DIAGNOSIS — M25561 Pain in right knee: Secondary | ICD-10-CM

## 2016-09-23 NOTE — Telephone Encounter (Signed)
Patient c/o right knee pain and swelling. He denies any injury, states he woke up with it hurting and swollen. He feels like this is similar to his swollen hand this office assessed him for last week. He is currently elevating it and keeping it iced.   Reviewed symptoms with Laverna Peace NP. She wants to order a doppler of the leg to ensure that there is nothing more serious. She also wants him to continue elevation, icing, rest and tylenol for pain control.  Reviewed all the above with patient. He states he can't come in for a doppler. He says his leg hurts too much. He asks if he should call 911. It was suggested that if he thinks he needs EMS transport that he should call EMS directly. Patient decided he doesn't want to have doppler and he's going to treat at home. Explained to patient possible risk and he understands. Gave him the number to radiology to scheduled doppler incase he decides to follow through.  All the above reviewed with Laverna Peace NP.

## 2016-09-23 NOTE — Telephone Encounter (Signed)
Pt is calling about his scan results -not sure what exactly he is talking about   Best numebr [phone]

## 2016-09-24 ENCOUNTER — Ambulatory Visit: Payer: Medicare Other | Admitting: Hematology & Oncology

## 2016-09-24 ENCOUNTER — Other Ambulatory Visit: Payer: Medicare Other

## 2016-09-24 ENCOUNTER — Ambulatory Visit: Payer: Medicare Other

## 2016-09-25 ENCOUNTER — Encounter: Payer: Self-pay | Admitting: Hematology & Oncology

## 2016-09-25 NOTE — Telephone Encounter (Signed)
LMOM asking pt to [facility] w/more details about what results he is calling about.

## 2016-09-30 ENCOUNTER — Other Ambulatory Visit: Payer: Medicare Other

## 2016-09-30 ENCOUNTER — Ambulatory Visit: Payer: Medicare Other | Admitting: Family

## 2016-09-30 ENCOUNTER — Ambulatory Visit: Payer: Medicare Other

## 2016-10-01 NOTE — Telephone Encounter (Signed)
Pt never returned our call. LMOM for pt that I am leaving another message for him to CB if he still needs any results. Last scan I see was in Oct CT chest which Dr Carlota Raspberry spoke to pt about. Dr Carlota Raspberry would have already gone over results of hand xray from 12/20.

## 2016-10-02 ENCOUNTER — Ambulatory Visit: Payer: Medicare Other

## 2016-10-02 ENCOUNTER — Other Ambulatory Visit: Payer: Medicare Other

## 2016-10-02 ENCOUNTER — Ambulatory Visit: Payer: Medicare Other | Admitting: Family

## 2016-10-06 IMAGING — DX DG CHEST 2V
2 series · 2 of 2 positions shown · non-contrast
Comparison: PET-CT of 06/04/2016 and chest x-ray of 05/27/2016

CLINICAL DATA: Right-sided chest pain for 3 days, shortness of
breath, history of metastatic bladder carcinoma

EXAM:
CHEST  2 VIEW

[chest pa]
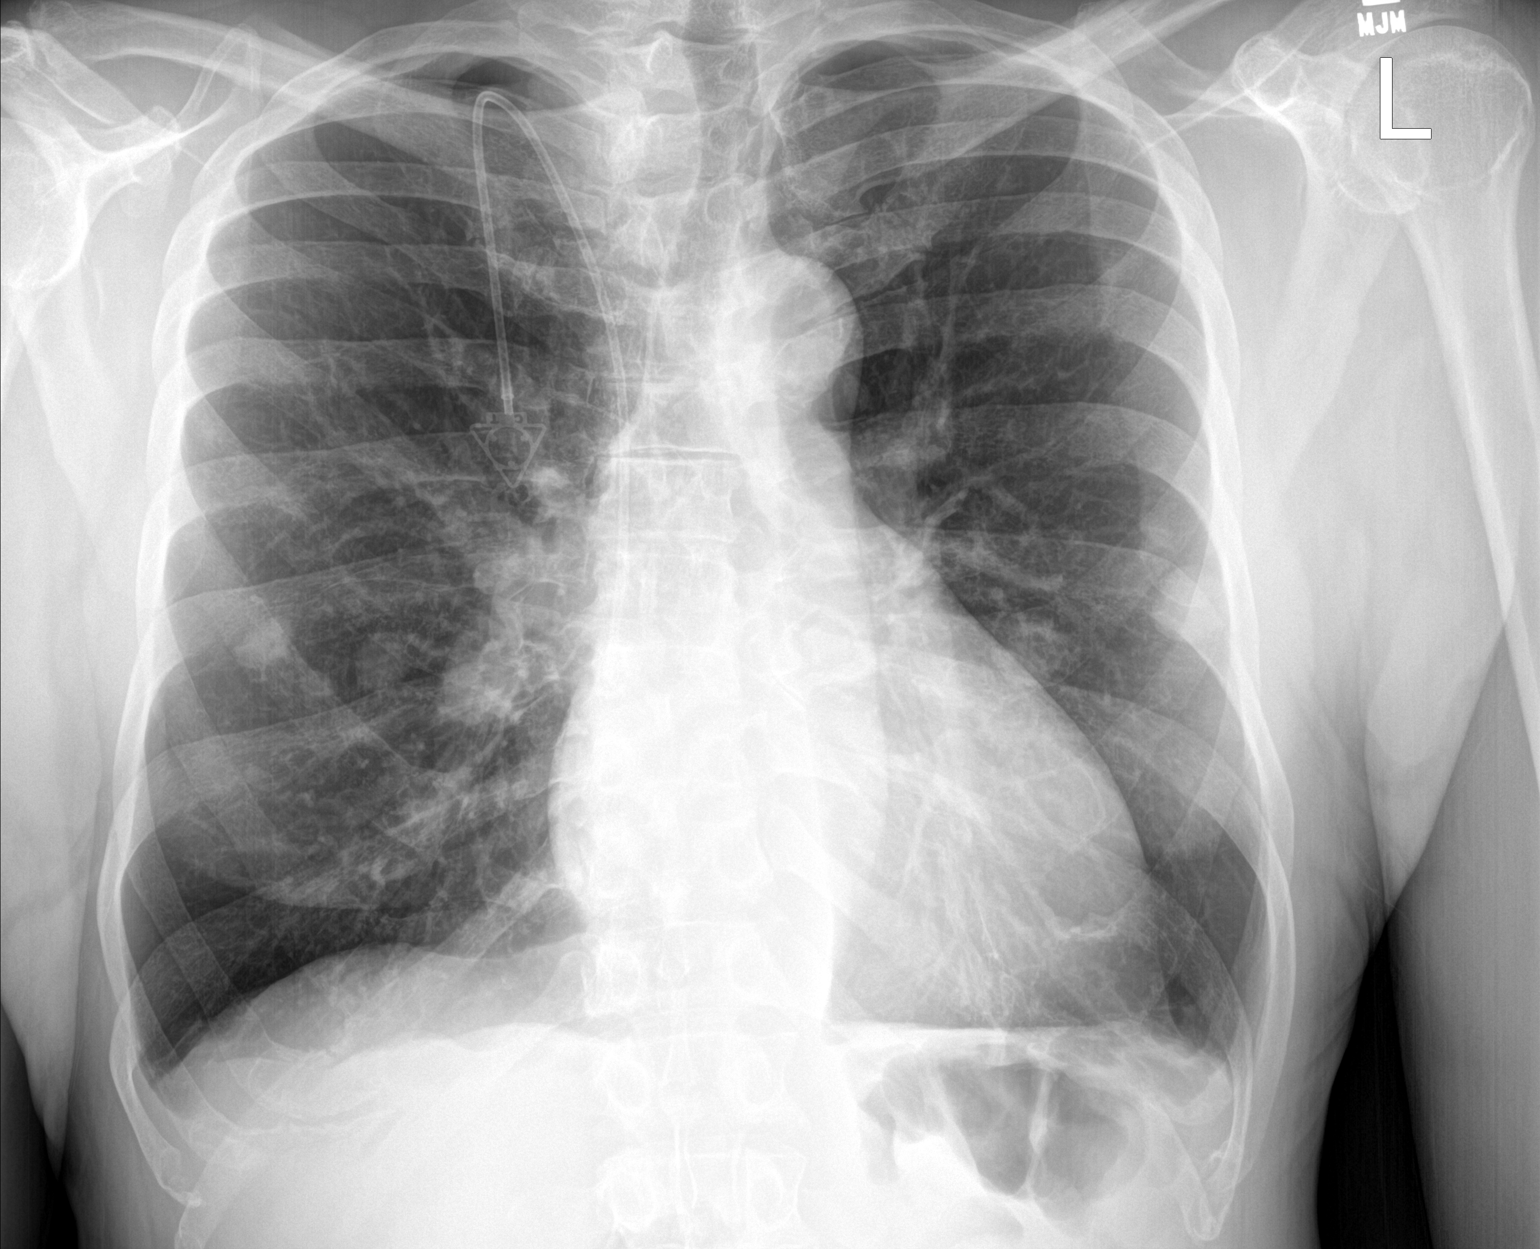

[chest lat]
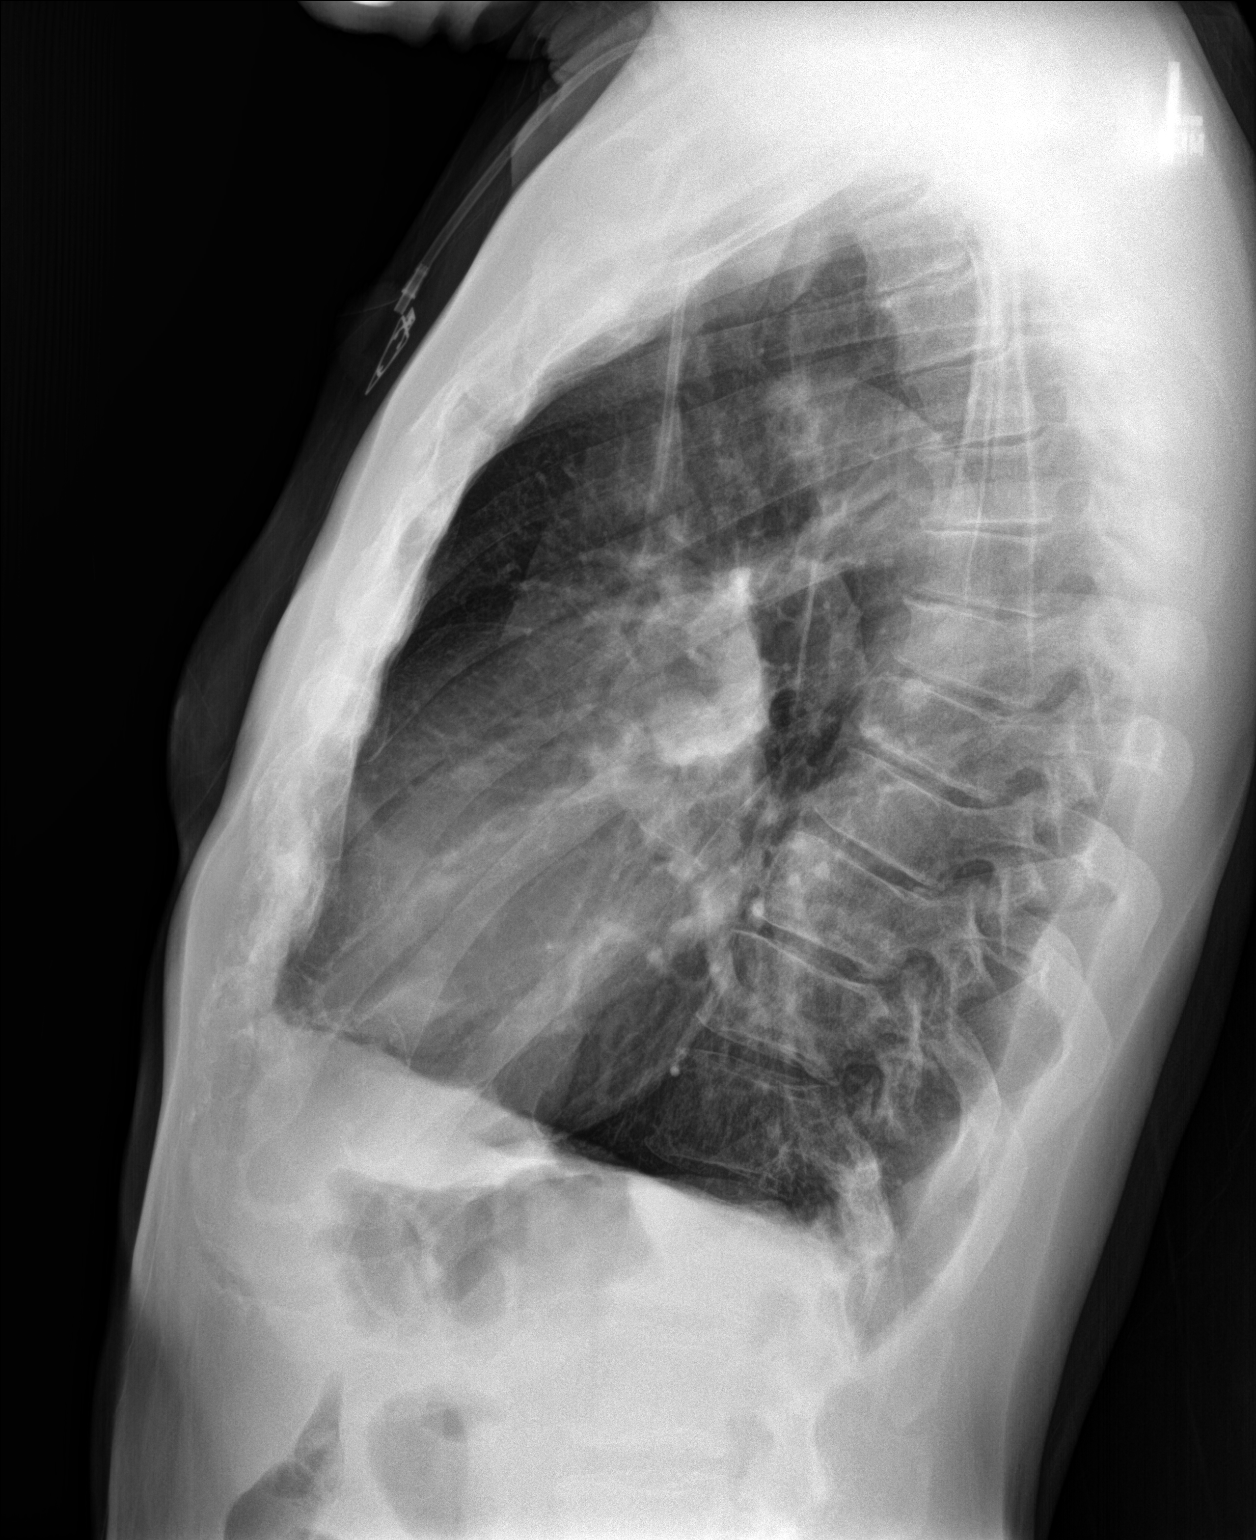

[2 of 2 positions shown; findings below may reference images not displayed]

FINDINGS: There are still pulmonary nodules bilaterally. These nodules appear
to have decreased in size and number. The dominant nodule is in the
periphery of the left mid lung base mid now measuring approximately
21 mm in diameter compared to 26 mm previously. No definite new
pulmonary nodule is seen. The lungs remain hyperaerated. No pleural
effusion is noted. The heart is mildly enlarged and stable.
Right-sided Port-A-Cath is present with the tip in the lower SVC.
IMPRESSION: 1. Decrease in size and number of pulmonary metastases. No new
abnormality.
2. No change in hyper aeration.
3. Port-A-Cath tip is seen to the lower SVC.

## 2016-10-08 ENCOUNTER — Encounter: Payer: Self-pay | Admitting: Oncology

## 2016-10-09 ENCOUNTER — Ambulatory Visit: Admission: RE | Admit: 2016-10-09 | Payer: Medicare Other | Source: Ambulatory Visit | Admitting: Radiation Oncology

## 2016-10-10 ENCOUNTER — Other Ambulatory Visit: Payer: Self-pay | Admitting: *Deleted

## 2016-10-10 DIAGNOSIS — T451X5A Adverse effect of antineoplastic and immunosuppressive drugs, initial encounter: Principal | ICD-10-CM

## 2016-10-10 DIAGNOSIS — C7951 Secondary malignant neoplasm of bone: Secondary | ICD-10-CM

## 2016-10-10 DIAGNOSIS — D6481 Anemia due to antineoplastic chemotherapy: Secondary | ICD-10-CM

## 2016-10-10 MED ORDER — OXYCODONE HCL 15 MG PO TABS: ORAL_TABLET | ORAL | 0 refills | Status: AC

## 2016-10-14 ENCOUNTER — Other Ambulatory Visit: Payer: Self-pay | Admitting: *Deleted

## 2016-10-14 DIAGNOSIS — C7951 Secondary malignant neoplasm of bone: Secondary | ICD-10-CM

## 2016-10-14 DIAGNOSIS — C679 Malignant neoplasm of bladder, unspecified: Secondary | ICD-10-CM

## 2016-10-14 DIAGNOSIS — C775 Secondary and unspecified malignant neoplasm of intrapelvic lymph nodes: Principal | ICD-10-CM

## 2016-10-14 MED ORDER — FENTANYL 75 MCG/HR TD PT72: 75.0000 ug | MEDICATED_PATCH | TRANSDERMAL | 0 refills | Status: AC

## 2016-10-15 ENCOUNTER — Other Ambulatory Visit: Payer: Medicare Other

## 2016-10-15 ENCOUNTER — Ambulatory Visit: Payer: Medicare Other | Admitting: Hematology & Oncology

## 2016-10-15 ENCOUNTER — Ambulatory Visit: Payer: Medicare Other

## 2016-10-17 ENCOUNTER — Ambulatory Visit: Payer: Medicare Other | Admitting: Hematology & Oncology

## 2016-10-17 ENCOUNTER — Ambulatory Visit: Payer: Medicare Other

## 2016-10-17 ENCOUNTER — Other Ambulatory Visit: Payer: Medicare Other

## 2016-10-20 ENCOUNTER — Other Ambulatory Visit (HOSPITAL_BASED_OUTPATIENT_CLINIC_OR_DEPARTMENT_OTHER): Payer: Medicare Other

## 2016-10-20 ENCOUNTER — Ambulatory Visit (HOSPITAL_BASED_OUTPATIENT_CLINIC_OR_DEPARTMENT_OTHER)
Admission: RE | Admit: 2016-10-20 | Discharge: 2016-10-20 | Disposition: A | Discharge status: Home / Self Care | Payer: Medicare Other | Source: Ambulatory Visit | Attending: Family | Admitting: Family

## 2016-10-20 ENCOUNTER — Ambulatory Visit (HOSPITAL_BASED_OUTPATIENT_CLINIC_OR_DEPARTMENT_OTHER): Payer: Medicare Other | Admitting: Family

## 2016-10-20 ENCOUNTER — Telehealth: Payer: Self-pay | Admitting: *Deleted

## 2016-10-20 ENCOUNTER — Ambulatory Visit (HOSPITAL_BASED_OUTPATIENT_CLINIC_OR_DEPARTMENT_OTHER): Payer: Medicare Other

## 2016-10-20 ENCOUNTER — Ambulatory Visit (HOSPITAL_COMMUNITY)
Admission: RE | Admit: 2016-10-20 | Discharge: 2016-10-20 | Disposition: A | Discharge status: Home / Self Care | Payer: Medicare Other | Source: Ambulatory Visit | Attending: Hematology & Oncology | Admitting: Hematology & Oncology

## 2016-10-20 ENCOUNTER — Ambulatory Visit: Payer: Medicare Other

## 2016-10-20 ENCOUNTER — Other Ambulatory Visit: Payer: Self-pay | Admitting: Family

## 2016-10-20 VITALS — BP 107/52 | HR 97 | Temp 98.1°F | Resp 18 | Wt 185.0 lb

## 2016-10-20 DIAGNOSIS — C679 Malignant neoplasm of bladder, unspecified: Secondary | ICD-10-CM

## 2016-10-20 DIAGNOSIS — D630 Anemia in neoplastic disease: Secondary | ICD-10-CM | POA: Diagnosis not present

## 2016-10-20 DIAGNOSIS — M7989 Other specified soft tissue disorders: Secondary | ICD-10-CM | POA: Insufficient documentation

## 2016-10-20 DIAGNOSIS — C7800 Secondary malignant neoplasm of unspecified lung: Secondary | ICD-10-CM | POA: Diagnosis not present

## 2016-10-20 DIAGNOSIS — D649 Anemia, unspecified: Secondary | ICD-10-CM

## 2016-10-20 DIAGNOSIS — R600 Localized edema: Secondary | ICD-10-CM | POA: Diagnosis not present

## 2016-10-20 DIAGNOSIS — C7951 Secondary malignant neoplasm of bone: Secondary | ICD-10-CM

## 2016-10-20 DIAGNOSIS — C775 Secondary and unspecified malignant neoplasm of intrapelvic lymph nodes: Secondary | ICD-10-CM

## 2016-10-20 DIAGNOSIS — R609 Edema, unspecified: Secondary | ICD-10-CM | POA: Diagnosis not present

## 2016-10-20 DIAGNOSIS — Z5111 Encounter for antineoplastic chemotherapy: Secondary | ICD-10-CM

## 2016-10-20 DIAGNOSIS — D61810 Antineoplastic chemotherapy induced pancytopenia: Secondary | ICD-10-CM | POA: Diagnosis not present

## 2016-10-20 DIAGNOSIS — M62838 Other muscle spasm: Secondary | ICD-10-CM

## 2016-10-20 LAB — CBC WITH DIFFERENTIAL (CANCER CENTER ONLY)
BASO#: 0 10*3/uL (ref 0.0–0.2)
BASO%: 0.2 % (ref 0.0–2.0)
EOS%: 0.4 % (ref 0.0–7.0)
Eosinophils Absolute: 0 10*3/uL (ref 0.0–0.5)
HEMATOCRIT: 20.2 % — AB (ref 38.7–49.9)
HGB: 6.2 g/dL — CL (ref 13.0–17.1)
LYMPH#: 1.2 10*3/uL (ref 0.9–3.3)
LYMPH%: 13.5 % — ABNORMAL LOW (ref 14.0–48.0)
MCH: 32.6 pg (ref 28.0–33.4)
MCHC: 30.7 g/dL — ABNORMAL LOW (ref 32.0–35.9)
MCV: 106 fL — AB (ref 82–98)
MONO#: 1.5 10*3/uL — AB (ref 0.1–0.9)
MONO%: 17.5 % — ABNORMAL HIGH (ref 0.0–13.0)
NEUT#: 5.8 10*3/uL (ref 1.5–6.5)
NEUT%: 68.4 % (ref 40.0–80.0)
PLATELETS: 269 10*3/uL (ref 145–400)
RBC: 1.9 10*6/uL — AB (ref 4.20–5.70)
RDW: 17.5 % — ABNORMAL HIGH (ref 11.1–15.7)
WBC: 8.5 10*3/uL (ref 4.0–10.0)

## 2016-10-20 LAB — CMP (CANCER CENTER ONLY)
ALK PHOS: 58 U/L (ref 26–84)
ALT: 20 U/L (ref 10–47)
AST: 51 U/L — ABNORMAL HIGH (ref 11–38)
Albumin: 3.1 g/dL — ABNORMAL LOW (ref 3.3–5.5)
BUN: 21 mg/dL (ref 7–22)
CALCIUM: 9.1 mg/dL (ref 8.0–10.3)
CO2: 24 mEq/L (ref 18–33)
Chloride: 98 mEq/L (ref 98–108)
Creat: 1.9 mg/dl — ABNORMAL HIGH (ref 0.6–1.2)
Glucose, Bld: 99 mg/dL (ref 73–118)
POTASSIUM: 3.9 meq/L (ref 3.3–4.7)
Sodium: 137 mEq/L (ref 128–145)
TOTAL PROTEIN: 8.3 g/dL — AB (ref 6.4–8.1)
Total Bilirubin: 0.9 mg/dl (ref 0.20–1.60)

## 2016-10-20 LAB — CHCC SATELLITE - SMEAR

## 2016-10-20 MED ORDER — PALONOSETRON HCL INJECTION 0.25 MG/5ML
INTRAVENOUS | Status: AC
  Filled 2016-10-20: qty 5

## 2016-10-20 MED ORDER — SODIUM CHLORIDE 0.9 % IV SOLN
Freq: Once | INTRAVENOUS | Status: AC
  Administered 2016-10-20: 13:00:00 via INTRAVENOUS
  Filled 2016-10-20: qty 5

## 2016-10-20 MED ORDER — CYCLOBENZAPRINE HCL 5 MG PO TABS: 5.0000 mg | ORAL_TABLET | Freq: Three times a day (TID) | ORAL | 0 refills | Status: AC | PRN

## 2016-10-20 MED ORDER — SODIUM CHLORIDE 0.9 % IV SOLN
60.0000 mg/m2 | Freq: Once | INTRAVENOUS | Status: AC
  Administered 2016-10-20: 123 mg via INTRAVENOUS
  Filled 2016-10-20: qty 50

## 2016-10-20 MED ORDER — SODIUM CHLORIDE 0.9% FLUSH
10.0000 mL | INTRAVENOUS | Status: DC | PRN
  Administered 2016-10-20: 10 mL
  Filled 2016-10-20: qty 10

## 2016-10-20 MED ORDER — ZOLEDRONIC ACID 4 MG/5ML IV CONC
3.3000 mg | Freq: Once | INTRAVENOUS | Status: AC
  Administered 2016-10-20: 3.3 mg via INTRAVENOUS
  Filled 2016-10-20: qty 4.13

## 2016-10-20 MED ORDER — PALONOSETRON HCL INJECTION 0.25 MG/5ML
0.2500 mg | Freq: Once | INTRAVENOUS | Status: AC
  Administered 2016-10-20: 0.25 mg via INTRAVENOUS

## 2016-10-20 MED ORDER — HEPARIN SOD (PORK) LOCK FLUSH 100 UNIT/ML IV SOLN
500.0000 [IU] | Freq: Once | INTRAVENOUS | Status: AC | PRN
  Administered 2016-10-20: 500 [IU]
  Filled 2016-10-20: qty 5

## 2016-10-20 MED ORDER — SODIUM CHLORIDE 0.9 % IV SOLN
390.0000 mg/m2 | Freq: Once | INTRAVENOUS | Status: AC
  Administered 2016-10-20: 800 mg via INTRAVENOUS
  Filled 2016-10-20: qty 12

## 2016-10-20 MED ORDER — POTASSIUM CHLORIDE 2 MEQ/ML IV SOLN
Freq: Once | INTRAVENOUS | Status: AC
  Administered 2016-10-20: 10:00:00 via INTRAVENOUS
  Filled 2016-10-20: qty 10

## 2016-10-20 NOTE — Progress Notes (Signed)
Prescription sent to patient's pharmacy for Flexeril for left hip muscle spasms.

## 2016-10-20 NOTE — Patient Instructions (Signed)
[place] Discharge Instructions for Patients Receiving Chemotherapy  Today you received the following chemotherapy agents Cisplatin, Alimta  To help prevent nausea and vomiting after your treatment, we encourage you to take your nausea medication    If you develop nausea and vomiting that is not controlled by your nausea medication, call the clinic.   BELOW ARE SYMPTOMS THAT SHOULD BE REPORTED IMMEDIATELY:  *FEVER GREATER THAN 100.5 F  *CHILLS WITH OR WITHOUT FEVER  NAUSEA AND VOMITING THAT IS NOT CONTROLLED WITH YOUR NAUSEA MEDICATION  *UNUSUAL SHORTNESS OF BREATH  *UNUSUAL BRUISING OR BLEEDING  TENDERNESS IN MOUTH AND THROAT WITH OR WITHOUT PRESENCE OF ULCERS  *URINARY PROBLEMS  *BOWEL PROBLEMS  UNUSUAL RASH Items with * indicate a potential emergency and should be followed up as soon as possible.  Feel free to call the clinic you have any questions or concerns. The clinic phone number is [phone].  Please show the [place] at check-in to the Emergency Department and triage nurse.

## 2016-10-20 NOTE — Telephone Encounter (Signed)
Critical Value HGB 6.2 Laverna Peace NP notified. Orders will be placed

## 2016-10-20 NOTE — Patient Instructions (Signed)
Implanted Port Insertion, Care After Refer to this sheet in the next few weeks. These instructions provide you with information on caring for yourself after your procedure. Your health care provider may also give you more specific instructions. Your treatment has been planned according to current medical practices, but problems sometimes occur. Call your health care provider if you have any problems or questions after your procedure. WHAT TO EXPECT AFTER THE PROCEDURE After your procedure, it is typical to have the following:   Discomfort at the port insertion site. Ice packs to the area will help.  Bruising on the skin over the port. This will subside in 3-4 days. HOME CARE INSTRUCTIONS  After your port is placed, you will get a manufacturer's information card. The card has information about your port. Keep this card with you at all times.   Know what kind of port you have. There are many types of ports available.   Wear a medical alert bracelet in case of an emergency. This can help alert health care workers that you have a port.   The port can stay in for as long as your health care provider believes it is necessary.   A home health care nurse may give medicines and take care of the port.   You or a family member can get special training and directions for giving medicine and taking care of the port at home.  SEEK MEDICAL CARE IF:   Your port does not flush or you are unable to get a blood return.   You have a fever or chills. SEEK IMMEDIATE MEDICAL CARE IF:  You have new fluid or pus coming from your incision.   You notice a bad smell coming from your incision site.   You have swelling, pain, or more redness at the incision or port site.   You have chest pain or shortness of breath. This information is not intended to replace advice given to you by your health care provider. Make sure you discuss any questions you have with your health care provider. Document  Released: 07/06/2013 Document Revised: 09/20/2013 Document Reviewed: 07/06/2013 Elsevier Interactive Patient Education  2017 [employer].  

## 2016-10-20 NOTE — Progress Notes (Signed)
Ok To treat for Creatinine of 1.6 per dR. Ennever.  Ok to treat with Cisplatin with UOP of 150 mls.

## 2016-10-20 NOTE — Progress Notes (Signed)
OK to proceed with medication and chemo with 133ml of urine output so far per MD Ennever

## 2016-10-20 NOTE — Progress Notes (Addendum)
Hematology and Oncology Follow Up Visit  ORLO HOPP [id number] 1946-08-08 71 y.o. 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 Sekhon is here today for a follow-up and treatment. He has missed some appointments with our office including a treatment. He received his last cycle on 12/6. He has completed palliative radiation to the right pelvis and states that his pain in that area is much improved. He has noticed some slight discomfort in the left hip that comes and goes and states that this is tolerable.  He is symptomatic with fatigue and feeling "off balance" at times. His Hgb is down at 6.2. Platelet count has resolved and is now 269. He denies having any episodes of bleeding or bruising.  His last PET scan was in November and showed improved pulmonary and right external iliac nodal metastatic disease and stable osseous metastatic disease. We will plan to repeat another scan after cycle 7.  No fever, chills, n/v, cough, rash, dizziness, chest pain, palpitations, abdominal pain or changes in bowel or bladder habits.  His SOB with exertion is mild and unchanged.  No lymphadenopathy found on assessment. He has swelling in both lower extremities with +2 pitting edema of the ankles. Pedal pulses are +1. We will get bilateral lower extremity US later today.   He is eating well and staying hydrated. His weight is stable at 185 lbs.   Medications:  Allergies as of 10/20/2016   No Known Allergies     Medication List       Accurate as of 10/20/16  8:47 AM. Always use your most recent med list.          dexamethasone 4 MG tablet Commonly known as:  DECADRON Take 1 tab two times a day the day before Alimta chemo. Take 2 tabs the day after chemo,  then take 2 tabs two times a day for 2 days.   fentaNYL 75 MCG/HR Commonly known as:  DURAGESIC - dosed mcg/hr Place 1 patch (75 mcg total) onto the skin every other day.   folic acid 1 MG tablet Commonly known as:  FOLVITE Take 1 tablet (1 mg total) by mouth daily. Start 5-7 days before Alimta chemotherapy. Continue until 21 days after Alimta completed.   hydrochlorothiazide 12.5 MG capsule Commonly known as:  MICROZIDE TAKE 1 CAPSULE BY MOUTH EVERY MORNING   lidocaine-prilocaine cream Commonly known as:  EMLA Apply to affected area once   ondansetron 8 MG tablet Commonly known as:  ZOFRAN Take 1 tablet (8 mg total) by mouth 2 (two) times daily as needed (Nausea or vomiting). Start if needed on the third day after chemotherapy.   oxyCODONE 15 MG immediate release tablet Commonly known as:  ROXICODONE Take 1 tablet , if needed, every 4 hrs for pain.   prochlorperazine 10 MG tablet Commonly known as:  COMPAZINE Take 1 tablet (10 mg total) by mouth every 6 (six) hours as needed (Nausea or vomiting).   tamsulosin 0.4 MG Caps capsule Commonly known as:  FLOMAX TAKE 2 CAPSULES BY MOUTH EVERY DAY AS DIRECTED       Allergies: No Known Allergies  Past Medical History, Surgical history, Social history, and Family History were reviewed and updated.  Review of Systems:  All other 10 point review of systems is negative.   Physical Exam:  vitals were not taken for this visit.  Wt Readings from Last 3 Encounters:  09/17/16 175 lb (79.4 kg)  09/03/16 185 lb (83.9 kg)  09/02/16 188 lb 12.8 oz (85.6 kg)    Ocular: Sclerae unicteric, pupils equal, round and reactive to light Ear-nose-throat: Oropharynx clear, dentition fair Lymphatic: No cervical supraclavicular or axillary adenopathy Lungs no rales or rhonchi, good excursion bilaterally Heart regular rate and rhythm, no murmur appreciated Abd soft, nontender, positive bowel sounds, no liver or spleen tip palpated on exam, no  fluid wave MSK no focal spinal tenderness, no joint edema Neuro: non-focal, well-oriented, appropriate affect Breasts: Deferred  Lab Results  Component Value Date   WBC 1.3 (L) 09/17/2016   HGB 7.0 (L) 09/17/2016   HCT 20.6 (L) 09/17/2016   MCV 96 09/17/2016   PLT <6 (LL) 09/17/2016   Lab Results  Component Value Date   FERRITIN 122 01/28/2016   IRON 51 01/28/2016   TIBC 348 01/28/2016   UIBC 297 01/28/2016   IRONPCTSAT 15 (L) 01/28/2016   Lab Results  Component Value Date   RBC 2.14 (L) 09/17/2016   No results found for: KPAFRELGTCHN, LAMBDASER, KAPLAMBRATIO No results found for: IGGSERUM, IGA, IGMSERUM No results found for: Odetta Pink, SPEI   Chemistry      Component Value Date/Time   NA 143 09/17/2016 1502   K 3.2 (L) 09/17/2016 1502   CL 103 09/17/2016 1502   CO2 27 09/17/2016 1502   BUN 23 (H) 09/17/2016 1502   CREATININE 1.6 (H) 09/17/2016 1502      Component Value Date/Time   CALCIUM 7.0 (L) 09/17/2016 1502   ALKPHOS 42 09/17/2016 1502   AST 29 09/17/2016 1502   ALT 16 09/17/2016 1502   BILITOT 1.20 09/17/2016 1502     Impression and Plan: Mr. Pasini is very pleasant 71 yo African American male with poorly differentiated metastatic bladder cancer with skeletal metastasis. He has missed some appointment with our office including a treatment day. He is here now symptomatic with anemia, Hgb 6.2. He is feeling fatigued and off balance.  We will proceed with cycle 6 today as planned per Dr. [name]. We will repeat a PET scan after cycle 7.  We will also give him 2 units of PRBC's tomorrow.  He will go down at 3:30 today for bilateral lower extremities [place] to assess for possible blood clot.  He has his current treatment and appointment schedule.  He will contact us with any questions or concerns. We can certainly see him sooner if need be.   Eliezer Bottom, NP 1/22/20188:47 AM   ADDENDUM:  Bilateral  lower extremity Dopplers are negative.  Lattie Haw, MD

## 2016-10-21 ENCOUNTER — Ambulatory Visit (HOSPITAL_BASED_OUTPATIENT_CLINIC_OR_DEPARTMENT_OTHER): Payer: Medicare Other

## 2016-10-21 DIAGNOSIS — C679 Malignant neoplasm of bladder, unspecified: Secondary | ICD-10-CM

## 2016-10-21 DIAGNOSIS — D61810 Antineoplastic chemotherapy induced pancytopenia: Secondary | ICD-10-CM | POA: Diagnosis not present

## 2016-10-21 DIAGNOSIS — M7989 Other specified soft tissue disorders: Secondary | ICD-10-CM

## 2016-10-21 DIAGNOSIS — C775 Secondary and unspecified malignant neoplasm of intrapelvic lymph nodes: Secondary | ICD-10-CM

## 2016-10-21 DIAGNOSIS — D649 Anemia, unspecified: Secondary | ICD-10-CM

## 2016-10-21 DIAGNOSIS — D696 Thrombocytopenia, unspecified: Secondary | ICD-10-CM

## 2016-10-21 DIAGNOSIS — D630 Anemia in neoplastic disease: Secondary | ICD-10-CM

## 2016-10-21 MED ORDER — ACETAMINOPHEN 325 MG PO TABS
650.0000 mg | ORAL_TABLET | Freq: Once | ORAL | Status: AC
  Administered 2016-10-21: 650 mg via ORAL

## 2016-10-21 MED ORDER — DIPHENHYDRAMINE HCL 25 MG PO CAPS
25.0000 mg | ORAL_CAPSULE | Freq: Once | ORAL | Status: AC
  Administered 2016-10-21: 25 mg via ORAL

## 2016-10-21 MED ORDER — SODIUM CHLORIDE 0.9 % IV SOLN
250.0000 mL | Freq: Once | INTRAVENOUS | Status: AC
  Administered 2016-10-21: 250 mL via INTRAVENOUS

## 2016-10-21 MED ORDER — ACETAMINOPHEN 325 MG PO TABS
ORAL_TABLET | ORAL | Status: AC
Start: 2016-10-21 — End: 2016-10-21
  Filled 2016-10-21: qty 2

## 2016-10-21 MED ORDER — DIPHENHYDRAMINE HCL 25 MG PO CAPS
ORAL_CAPSULE | ORAL | Status: AC
  Filled 2016-10-21: qty 1

## 2016-10-21 MED ORDER — FUROSEMIDE 10 MG/ML IJ SOLN: 20.0000 mg | Freq: Once | INTRAMUSCULAR | Status: DC

## 2016-10-21 MED ORDER — SODIUM CHLORIDE 0.9% FLUSH
10.0000 mL | INTRAVENOUS | Status: DC | PRN
  Filled 2016-10-21: qty 10

## 2016-10-21 MED FILL — oxyCODONE HCL 15 MG TABS: 15 | 15 days supply | Qty: 90 | Fill #0

## 2016-10-22 LAB — TYPE AND SCREEN
BLOOD PRODUCT EXPIRATION DATE: 201802072359
Blood Product Expiration Date: 201802072359
ISSUE DATE / TIME: 201801230851
ISSUE DATE / TIME: 201801230851
UNIT TYPE AND RH: 5100
Unit Type and Rh: 5100

## 2016-10-22 MED FILL — fentaNYL 75 MCG/HR PT72: 75 | 30 days supply | Qty: 15 | Fill #0

## 2016-10-23 ENCOUNTER — Encounter: Payer: Self-pay | Admitting: Hematology & Oncology

## 2016-10-27 ENCOUNTER — Telehealth: Payer: Self-pay | Admitting: *Deleted

## 2016-10-27 MED ORDER — BACLOFEN 10 MG PO TABS: 10.0000 mg | ORAL_TABLET | Freq: Three times a day (TID) | ORAL | 3 refills | Status: AC

## 2016-10-27 NOTE — Telephone Encounter (Signed)
Patient is c/o hiccups. They started Friday evening and have been mostly constant since that time. He states he is now having trouble sleeping.  Spoke with Laverna Peace NP and she would like to prescribe Baclofen.  Patient aware of new prescription. Pharmacy confirmed.

## 2016-10-29 ENCOUNTER — Telehealth: Payer: Self-pay | Admitting: *Deleted

## 2016-10-29 MED ORDER — MAGIC MOUTHWASH W/LIDOCAINE: 5.0000 mL | Freq: Four times a day (QID) | ORAL | 3 refills | Status: AC | PRN

## 2016-10-29 NOTE — Telephone Encounter (Signed)
Patient c/o difficulty swallowing. He denies dryness or any feelings of 'stuck' objects. He states he is uncomfortable when swallowing food or liquid.  Reviewed with Dr [name]. He would like a prescription sent in for Magic Mouthwash. Patient aware of new prescription and pharmacy confirmed.

## 2016-11-05 ENCOUNTER — Other Ambulatory Visit: Payer: Medicare Other

## 2016-11-05 ENCOUNTER — Ambulatory Visit: Payer: Medicare Other | Admitting: Hematology & Oncology

## 2016-11-05 ENCOUNTER — Ambulatory Visit: Payer: Medicare Other

## 2016-11-07 ENCOUNTER — Ambulatory Visit: Payer: Medicare Other | Admitting: Hematology & Oncology

## 2016-11-07 ENCOUNTER — Other Ambulatory Visit: Payer: Medicare Other

## 2016-11-07 ENCOUNTER — Ambulatory Visit: Payer: Medicare Other

## 2016-11-11 ENCOUNTER — Other Ambulatory Visit: Payer: Medicare Other

## 2016-11-11 ENCOUNTER — Ambulatory Visit: Payer: Medicare Other | Admitting: Hematology & Oncology

## 2016-11-11 ENCOUNTER — Ambulatory Visit: Payer: Medicare Other

## 2016-11-27 DIAGNOSIS — 419620001 Death: Secondary | SNOMED CT | POA: Diagnosis not present

## 2016-11-27 DEATH — deceased

## 2017-07-20 ENCOUNTER — Telehealth: Payer: Self-pay

## 2017-07-20 NOTE — Telephone Encounter (Signed)
Called pt to schedule Medicare Annual Wellness Visit. -nr  

## 2018-02-19 IMAGING — US US EXTREM LOW VENOUS BILAT
1 series · 13 of 24 positions shown · non-contrast
Comparison: None.

CLINICAL DATA: 70-year-old male with a history of bilateral leg
swelling



[Series 1: us extrem low venous bilat · 0.05mm/px · 13 of 47 slices shown]
[im 1/47]
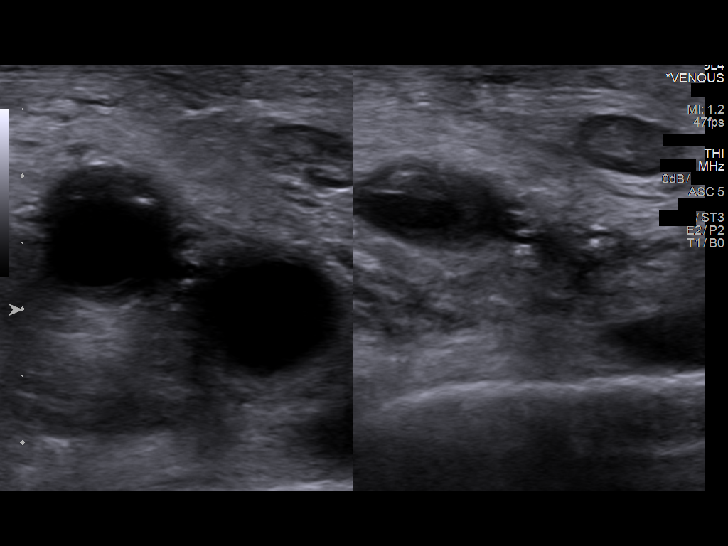
[im 5/47]
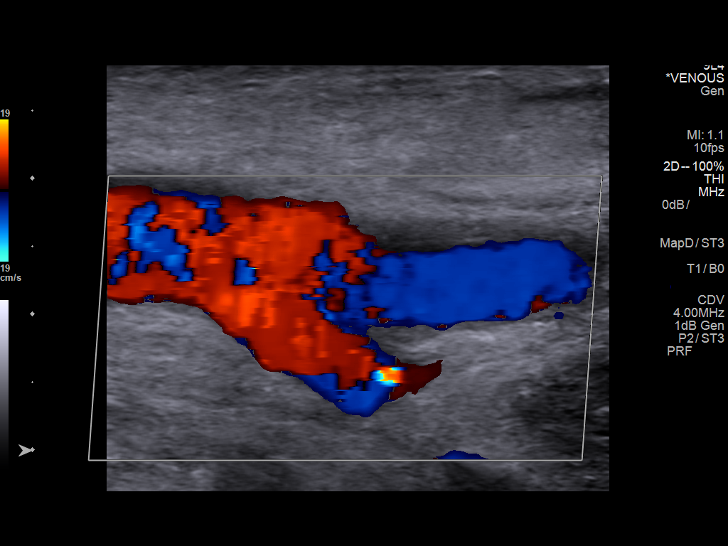
[im 9/47]
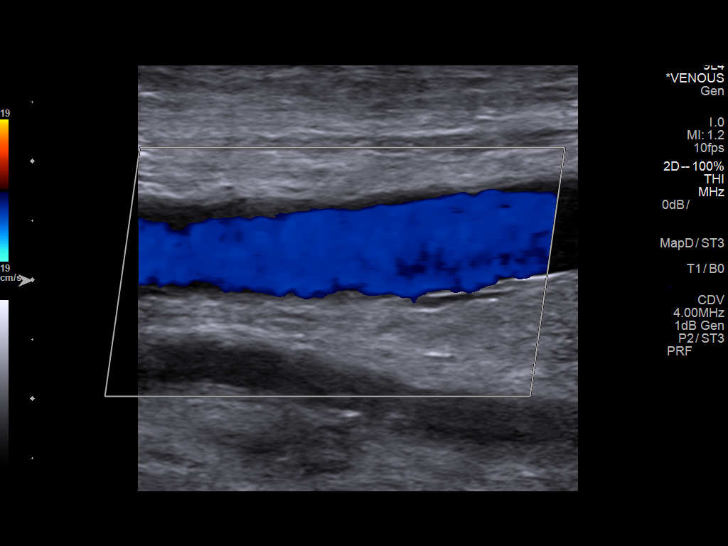
[im 13/47]
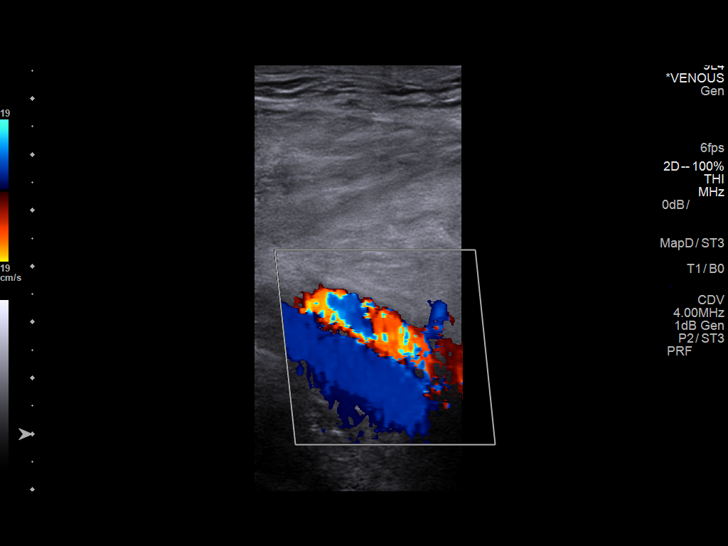
[im 17/47]
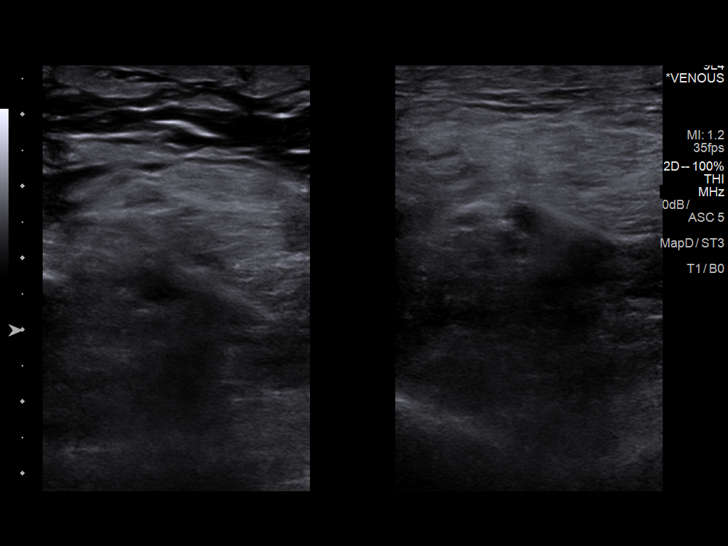
[im 21/47]
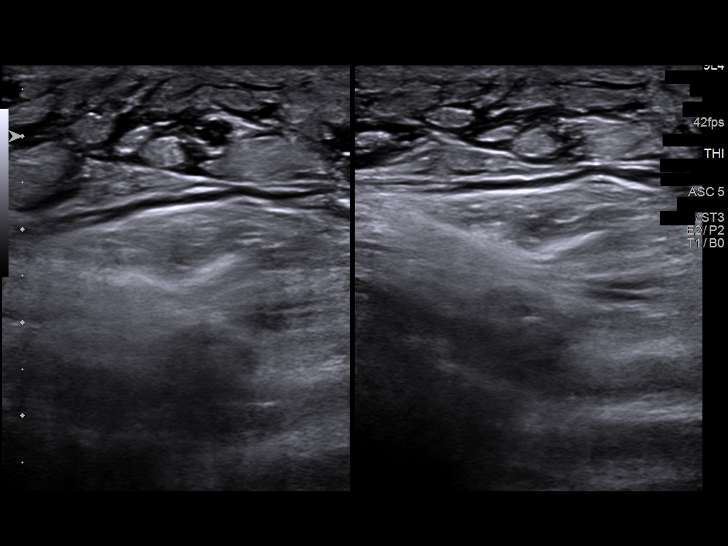
[im 25/47]
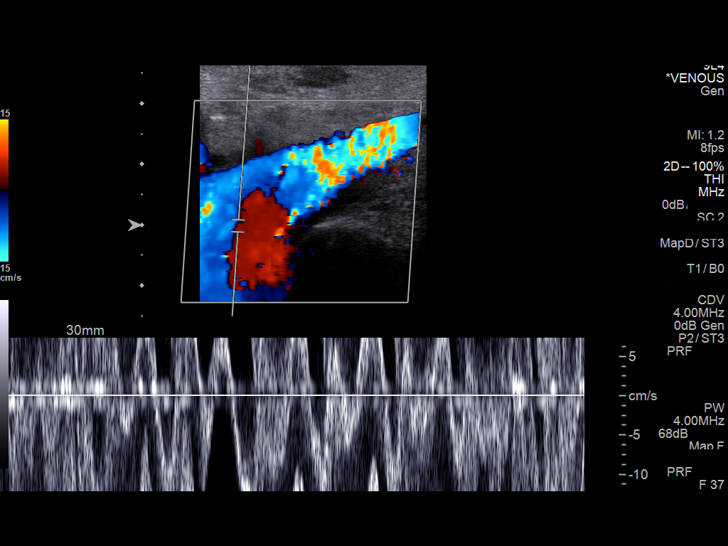
[im 27/47]
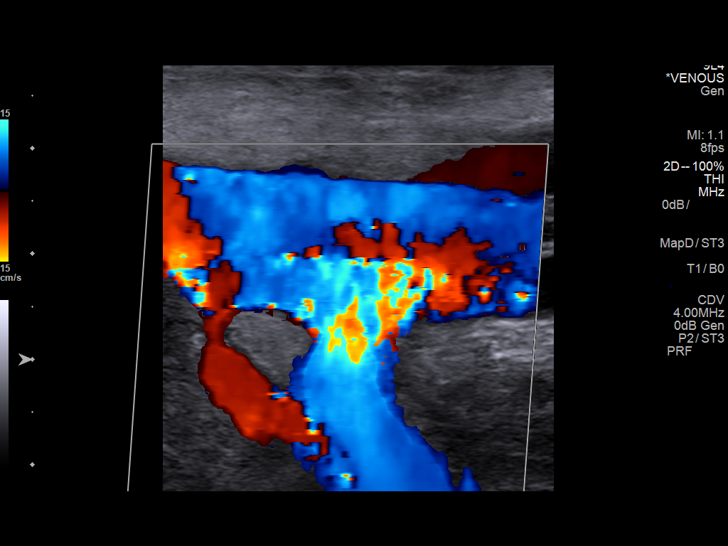
[im 31/47]
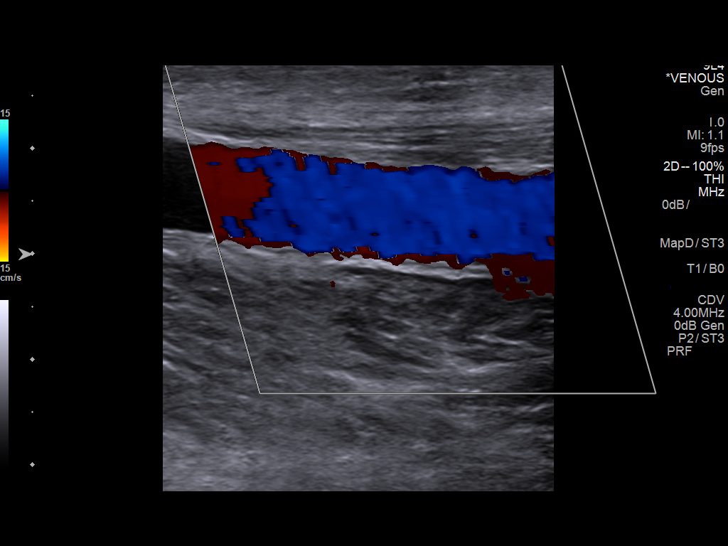
[im 35/47]
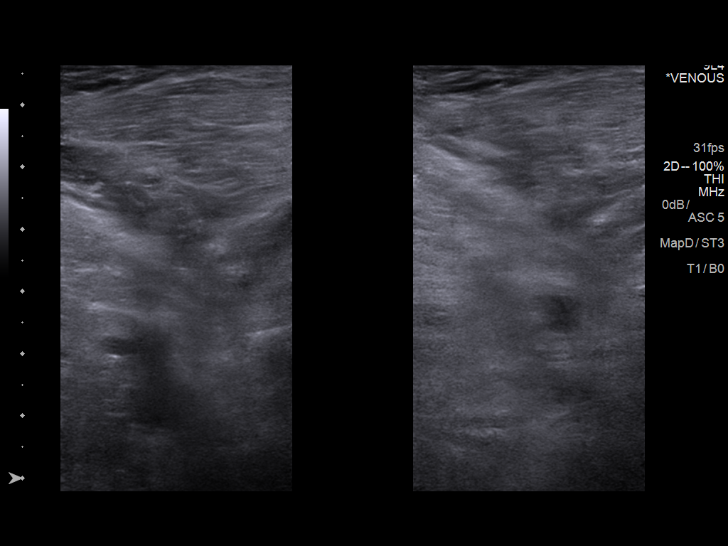
[im 39/47]
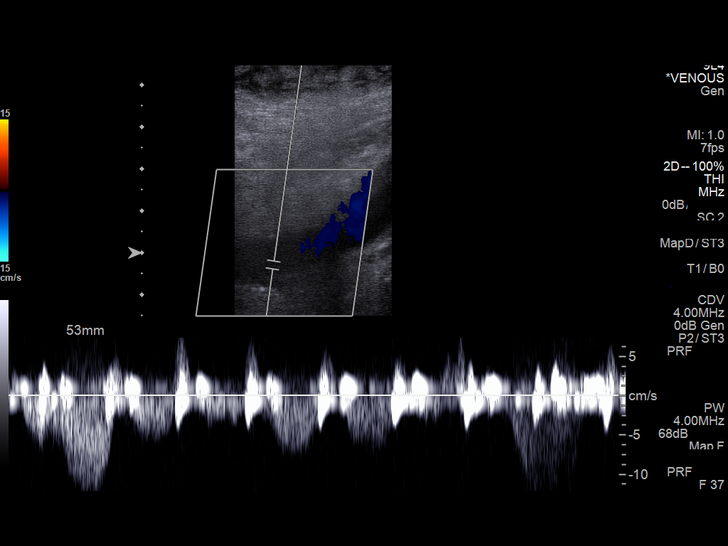
[im 43/47]
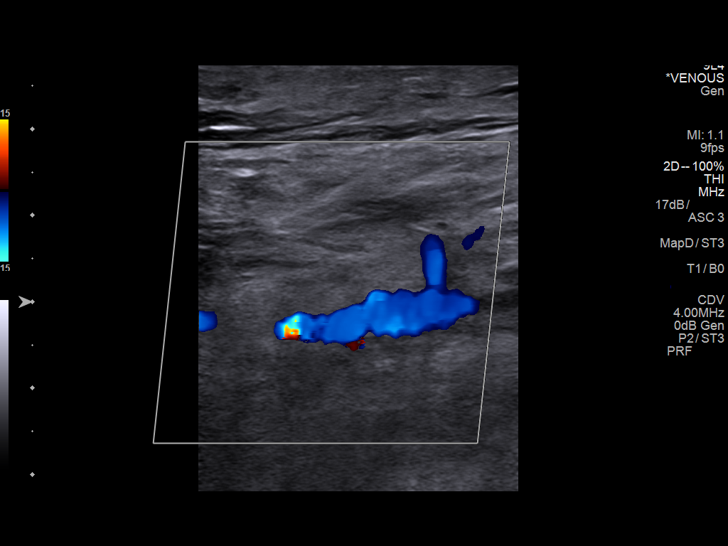
[im 47/47]
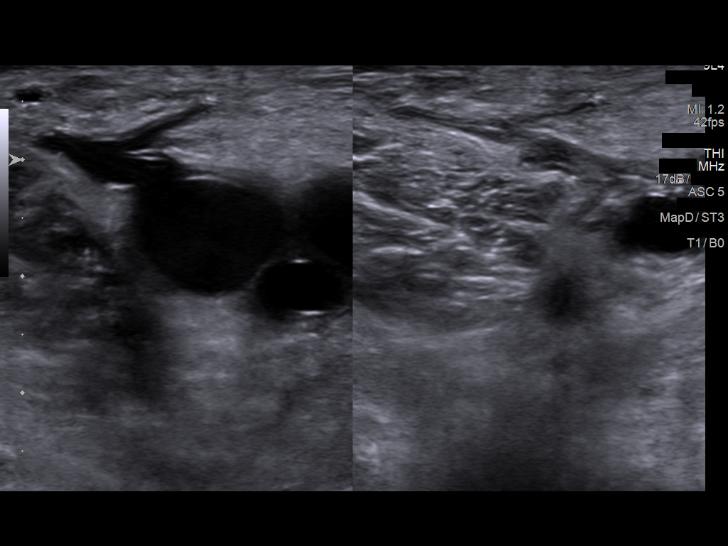

[13 of 24 positions shown; findings below may reference images not displayed]

FINDINGS: RIGHT LOWER EXTREMITY

Common Femoral Vein: No evidence of thrombus. Normal
compressibility, respiratory phasicity and response to augmentation.

Saphenofemoral Junction: No evidence of thrombus. Normal
compressibility and flow on color Doppler imaging.

Profunda Femoral Vein: No evidence of thrombus. Normal
compressibility and flow on color Doppler imaging.

Femoral Vein: No evidence of thrombus. Normal compressibility,
respiratory phasicity and response to augmentation.

Popliteal Vein: No evidence of thrombus. Normal compressibility,
respiratory phasicity and response to augmentation.

Calf Veins: No evidence of thrombus. Normal compressibility and flow
on color Doppler imaging.

Superficial Great Saphenous Vein: No evidence of thrombus. Normal
compressibility and flow on color Doppler imaging.

Other Findings:  Edema

LEFT LOWER EXTREMITY

Common Femoral Vein: No evidence of thrombus. Normal
compressibility, respiratory phasicity and response to augmentation.

Saphenofemoral Junction: No evidence of thrombus. Normal
compressibility and flow on color Doppler imaging.

Profunda Femoral Vein: No evidence of thrombus. Normal
compressibility and flow on color Doppler imaging.

Femoral Vein: No evidence of thrombus. Normal compressibility,
respiratory phasicity and response to augmentation.

Popliteal Vein: No evidence of thrombus. Normal compressibility,
respiratory phasicity and response to augmentation.

Calf Veins: No evidence of thrombus. Normal compressibility and flow
on color Doppler imaging.

Superficial Great Saphenous Vein: No evidence of thrombus. Normal
compressibility and flow on color Doppler imaging.

Other Findings:  Edema
IMPRESSION: Sonographic survey of the bilateral lower extremities negative for
DVT.

Edema of the bilateral lower extremities.
